# Patient Record
Sex: Female | Born: 1950
Health system: Southern US, Community
[De-identification: ages and names within clinical notes are randomized; demographics above are authoritative.]

## PROBLEM LIST (undated history)

## (undated) DIAGNOSIS — R112 Nausea with vomiting, unspecified: Secondary | ICD-10-CM

## (undated) DIAGNOSIS — Z923 Personal history of irradiation: Secondary | ICD-10-CM

## (undated) DIAGNOSIS — T7840XA Allergy, unspecified, initial encounter: Secondary | ICD-10-CM

## (undated) DIAGNOSIS — Z9889 Other specified postprocedural states: Secondary | ICD-10-CM

## (undated) DIAGNOSIS — I10 Essential (primary) hypertension: Secondary | ICD-10-CM

## (undated) DIAGNOSIS — K900 Celiac disease: Secondary | ICD-10-CM

## (undated) DIAGNOSIS — E039 Hypothyroidism, unspecified: Secondary | ICD-10-CM

## (undated) HISTORY — DX: Allergy, unspecified, initial encounter: T78.40XA

## (undated) HISTORY — DX: Hypothyroidism, unspecified: E03.9

## (undated) HISTORY — PX: OOPHORECTOMY: SHX86

## (undated) HISTORY — PX: OTHER SURGICAL HISTORY: SHX169

## (undated) HISTORY — DX: Celiac disease: K90.0

## (undated) HISTORY — PX: CHOLECYSTECTOMY: SHX55

## (undated) HISTORY — DX: Essential (primary) hypertension: I10

## (undated) HISTORY — PX: ABDOMINAL HYSTERECTOMY: SHX81

## (undated) HISTORY — PX: TONSILLECTOMY: SUR1361

## (undated) HISTORY — PX: COCCYX REMOVAL: SHX600

---
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
## 1998-05-01 ENCOUNTER — Other Ambulatory Visit: Admission: RE | Admit: 1998-05-01 | Discharge: 1998-05-01 | Payer: Self-pay | Admitting: Gynecology

## 1999-05-06 ENCOUNTER — Encounter: Admission: RE | Admit: 1999-05-06 | Discharge: 1999-05-06 | Payer: Self-pay | Admitting: Internal Medicine

## 1999-05-06 ENCOUNTER — Encounter: Payer: Self-pay | Admitting: Internal Medicine

## 1999-05-13 ENCOUNTER — Encounter: Payer: Self-pay | Admitting: Internal Medicine

## 1999-05-13 ENCOUNTER — Encounter: Admission: RE | Admit: 1999-05-13 | Discharge: 1999-05-13 | Payer: Self-pay | Admitting: Internal Medicine

## 1999-07-29 ENCOUNTER — Other Ambulatory Visit: Admission: RE | Admit: 1999-07-29 | Discharge: 1999-07-29 | Payer: Self-pay | Admitting: Gynecology

## 2000-07-29 ENCOUNTER — Other Ambulatory Visit: Admission: RE | Admit: 2000-07-29 | Discharge: 2000-07-29 | Payer: Self-pay | Admitting: Gynecology

## 2001-01-11 ENCOUNTER — Encounter: Payer: Self-pay | Admitting: Gynecology

## 2001-01-18 ENCOUNTER — Inpatient Hospital Stay (HOSPITAL_COMMUNITY): Admission: RE | Admit: 2001-01-18 | Discharge: 2001-01-20 | Payer: Self-pay | Admitting: Gynecology

## 2001-01-18 ENCOUNTER — Encounter (INDEPENDENT_AMBULATORY_CARE_PROVIDER_SITE_OTHER): Payer: Self-pay | Admitting: *Deleted

## 2002-01-10 ENCOUNTER — Encounter: Admission: RE | Admit: 2002-01-10 | Discharge: 2002-01-10 | Payer: Self-pay | Admitting: Urology

## 2002-01-10 ENCOUNTER — Encounter: Payer: Self-pay | Admitting: Urology

## 2002-02-02 ENCOUNTER — Encounter: Payer: Self-pay | Admitting: Urology

## 2002-02-02 ENCOUNTER — Encounter: Admission: RE | Admit: 2002-02-02 | Discharge: 2002-02-02 | Payer: Self-pay | Admitting: Urology

## 2002-03-30 ENCOUNTER — Other Ambulatory Visit: Admission: RE | Admit: 2002-03-30 | Discharge: 2002-03-30 | Payer: Self-pay | Admitting: Gynecology

## 2003-04-04 ENCOUNTER — Other Ambulatory Visit: Admission: RE | Admit: 2003-04-04 | Discharge: 2003-04-04 | Payer: Self-pay | Admitting: Gynecology

## 2003-08-07 ENCOUNTER — Encounter: Admission: RE | Admit: 2003-08-07 | Discharge: 2003-08-07 | Payer: Self-pay | Admitting: Internal Medicine

## 2003-09-20 ENCOUNTER — Encounter: Admission: RE | Admit: 2003-09-20 | Discharge: 2003-09-20 | Payer: Self-pay | Admitting: Gynecology

## 2004-04-14 ENCOUNTER — Other Ambulatory Visit: Admission: RE | Admit: 2004-04-14 | Discharge: 2004-04-14 | Payer: Self-pay | Admitting: Gynecology

## 2004-06-30 ENCOUNTER — Ambulatory Visit: Payer: Self-pay | Admitting: Internal Medicine

## 2005-04-27 ENCOUNTER — Other Ambulatory Visit: Admission: RE | Admit: 2005-04-27 | Discharge: 2005-04-27 | Payer: Self-pay | Admitting: Gynecology

## 2005-09-23 ENCOUNTER — Ambulatory Visit: Payer: Self-pay | Admitting: Internal Medicine

## 2006-04-29 ENCOUNTER — Other Ambulatory Visit: Admission: RE | Admit: 2006-04-29 | Discharge: 2006-04-29 | Payer: Self-pay | Admitting: Gynecology

## 2006-05-12 ENCOUNTER — Encounter: Admission: RE | Admit: 2006-05-12 | Discharge: 2006-05-12 | Payer: Self-pay | Admitting: Gynecology

## 2006-06-15 ENCOUNTER — Ambulatory Visit: Payer: Self-pay | Admitting: Internal Medicine

## 2006-08-27 ENCOUNTER — Ambulatory Visit: Payer: Self-pay | Admitting: Internal Medicine

## 2006-11-10 ENCOUNTER — Ambulatory Visit: Payer: Self-pay | Admitting: Internal Medicine

## 2007-01-06 ENCOUNTER — Encounter: Admission: RE | Admit: 2007-01-06 | Discharge: 2007-01-06 | Payer: Self-pay | Admitting: Gynecology

## 2007-04-13 ENCOUNTER — Telehealth: Payer: Self-pay | Admitting: Internal Medicine

## 2007-06-02 ENCOUNTER — Other Ambulatory Visit: Admission: RE | Admit: 2007-06-02 | Discharge: 2007-06-02 | Payer: Self-pay | Admitting: Gynecology

## 2007-06-03 ENCOUNTER — Ambulatory Visit: Payer: Self-pay | Admitting: Internal Medicine

## 2007-06-05 DIAGNOSIS — I10 Essential (primary) hypertension: Secondary | ICD-10-CM | POA: Insufficient documentation

## 2007-06-13 ENCOUNTER — Ambulatory Visit: Payer: Self-pay | Admitting: Internal Medicine

## 2007-07-20 ENCOUNTER — Ambulatory Visit: Payer: Self-pay | Admitting: Internal Medicine

## 2007-07-20 DIAGNOSIS — J309 Allergic rhinitis, unspecified: Secondary | ICD-10-CM | POA: Insufficient documentation

## 2007-09-05 ENCOUNTER — Telehealth: Payer: Self-pay | Admitting: Internal Medicine

## 2007-10-18 ENCOUNTER — Ambulatory Visit: Payer: Self-pay | Admitting: Internal Medicine

## 2007-11-25 ENCOUNTER — Telehealth: Payer: Self-pay | Admitting: Internal Medicine

## 2007-11-30 ENCOUNTER — Telehealth (INDEPENDENT_AMBULATORY_CARE_PROVIDER_SITE_OTHER): Payer: Self-pay | Admitting: *Deleted

## 2007-12-06 ENCOUNTER — Ambulatory Visit: Payer: Self-pay | Admitting: Internal Medicine

## 2007-12-12 ENCOUNTER — Telehealth: Payer: Self-pay | Admitting: Internal Medicine

## 2007-12-14 ENCOUNTER — Telehealth (INDEPENDENT_AMBULATORY_CARE_PROVIDER_SITE_OTHER): Payer: Self-pay | Admitting: *Deleted

## 2007-12-19 ENCOUNTER — Encounter: Admission: RE | Admit: 2007-12-19 | Discharge: 2007-12-19 | Payer: Self-pay | Admitting: Internal Medicine

## 2007-12-23 ENCOUNTER — Encounter: Payer: Self-pay | Admitting: Internal Medicine

## 2007-12-26 ENCOUNTER — Encounter: Payer: Self-pay | Admitting: Internal Medicine

## 2008-01-06 ENCOUNTER — Encounter: Payer: Self-pay | Admitting: Internal Medicine

## 2008-03-07 ENCOUNTER — Encounter: Payer: Self-pay | Admitting: Internal Medicine

## 2008-03-27 ENCOUNTER — Telehealth: Payer: Self-pay | Admitting: Internal Medicine

## 2008-04-20 ENCOUNTER — Ambulatory Visit: Payer: Self-pay | Admitting: Internal Medicine

## 2008-04-20 DIAGNOSIS — K900 Celiac disease: Secondary | ICD-10-CM | POA: Insufficient documentation

## 2008-06-20 ENCOUNTER — Encounter: Payer: Self-pay | Admitting: Internal Medicine

## 2008-11-07 ENCOUNTER — Ambulatory Visit: Payer: Self-pay | Admitting: Internal Medicine

## 2008-11-07 LAB — HM COLONOSCOPY

## 2008-11-07 LAB — HM MAMMOGRAPHY

## 2008-11-09 LAB — CONVERTED CEMR LAB
ALT: 38 units/L — ABNORMAL HIGH (ref 0–35)
BUN: 17 mg/dL (ref 6–23)
Basophils Absolute: 0 10*3/uL (ref 0.0–0.1)
Bilirubin, Direct: 0.3 mg/dL (ref 0.0–0.3)
Chloride: 106 meq/L (ref 96–112)
Cholesterol: 163 mg/dL (ref 0–200)
Creatinine, Ser: 0.9 mg/dL (ref 0.4–1.2)
Eosinophils Absolute: 0 10*3/uL (ref 0.0–0.7)
Eosinophils Relative: 0.6 % (ref 0.0–5.0)
GFR calc non Af Amer: 68.24 mL/min (ref >60)
LDL Cholesterol: 102 mg/dL — ABNORMAL HIGH (ref 0–99)
MCV: 92.7 fL (ref 78.0–100.0)
Monocytes Absolute: 0.2 10*3/uL (ref 0.1–1.0)
Neutrophils Relative %: 58.1 % (ref 43.0–77.0)
Platelets: 254 10*3/uL (ref 150.0–400.0)
Total Bilirubin: 1.1 mg/dL (ref 0.3–1.2)
Triglycerides: 89 mg/dL (ref 0.0–149.0)
VLDL: 17.8 mg/dL (ref 0.0–40.0)
WBC: 4.1 10*3/uL — ABNORMAL LOW (ref 4.5–10.5)

## 2009-03-15 ENCOUNTER — Encounter: Admission: RE | Admit: 2009-03-15 | Discharge: 2009-03-15 | Payer: Self-pay | Admitting: Gynecology

## 2009-06-24 ENCOUNTER — Ambulatory Visit: Payer: Self-pay | Admitting: Internal Medicine

## 2009-06-24 DIAGNOSIS — K120 Recurrent oral aphthae: Secondary | ICD-10-CM | POA: Insufficient documentation

## 2009-10-21 ENCOUNTER — Encounter: Payer: Self-pay | Admitting: Internal Medicine

## 2010-02-27 ENCOUNTER — Ambulatory Visit: Payer: Self-pay | Admitting: Internal Medicine

## 2010-03-24 ENCOUNTER — Telehealth: Payer: Self-pay | Admitting: Internal Medicine

## 2010-08-02 ENCOUNTER — Encounter: Payer: Self-pay | Admitting: Internal Medicine

## 2010-08-02 ENCOUNTER — Encounter: Payer: Self-pay | Admitting: Gynecology

## 2010-08-14 NOTE — Letter (Signed)
Summary: Gynecology-Dr. Rexford Maus  Gynecology-Dr. Rexford Maus   Imported By: Laural Benes 10/30/2009 11:29:59  _____________________________________________________________________  External Attachment:    Type:   Image     Comment:   External Document

## 2010-08-14 NOTE — Progress Notes (Signed)
Summary: fexofenadine refill  Phone Note Refill Request Message from:  Fax from Pharmacy on March 24, 2010 12:19 PM  Refills Requested: Medication #1:  FEXOFENADINE HCL 60 MG TABS Take 1 tablet by mouth two times a day as needed Initial call taken by: Westley Hummer CMA Deborra Medina),  March 24, 2010 12:19 PM    Prescriptions: FEXOFENADINE HCL 60 MG TABS (FEXOFENADINE HCL) Take 1 tablet by mouth two times a day as needed  #180 Tablet x 3   Entered by:   Westley Hummer CMA ([place])   Authorized by:   Phoebe Sharps MD   Signed by:   Westley Hummer CMA ([place]) on 03/24/2010   Method used:   Electronically to        [facility] [phone]* (retail)       [address]       [place], [place]  [address]       Ph: [phone] or [phone]       Fax: [phone]   RxID:   [id number]

## 2010-08-14 NOTE — Assessment & Plan Note (Signed)
Summary: ?conjunctivitis/mucus like discharge from eye/burning and itc...   Vital Signs:  Patient profile:   60 year old female Weight:      182 pounds Temp:     97.6 degrees F oral BP sitting:   130 / 80  (left arm) Cuff size:   regular  Vitals Entered By: Clearnce Sorrel CMA (February 27, 2010 9:22 AM) CC: Lft eye,conjunctivitis, mucus, discharge, eye, burning, ichy, x 2 days,src   CC:  Lft eye, conjunctivitis, mucus, discharge, eye, burning, ichy, x 2 days, and src.  History of Present Illness: 60 year old patient who is in today with a two-day history ofredness and minimal drainage involving the left eye.  Approximately 4 weeks ago,  she had surgery for a retinal tear.  There is been no change in her visual acuity nor pain.  She does  have a history of allergies but her right eye has not been affected.  She has treated hypertension.  She was prescribed.  Three days of antibiotic eyedrops, post surgery.  Current Medications (verified): 1)  Fexofenadine Hcl 60 Mg Tabs (Fexofenadine Hcl) .... Take 1 Tablet By Mouth Two Times A Day As Needed 2)  Lisinopril-Hydrochlorothiazide 20-25 Mg  Tabs (Lisinopril-Hydrochlorothiazide) .... Take 1 Tab By Mouth Daily 3)  Vivelle-Dot 0.0375 Mg/24hr  Pttw (Estradiol) .... Change 2x Week--Changes 1x Week 4)  Anestacon 2 % Gel (Lidocaine Hcl) .Marland Kitchen.. 1 Teaspoon Swiss and Swallow Every 6 Hours As Needed For Pain 5)  Hydrocodone-Acetaminophen 5-500 Mg Tabs (Hydrocodone-Acetaminophen) .... One Every 6 Hours As Needed For Pain 6)  Fish Oil 1000 Mg Caps (Omega-3 Fatty Acids) .... Once A Day  Allergies (verified): 1)  ! Keflex  Past History:  Past Medical History: Reviewed history from 07/20/2007 and no changes required. Hypertension Allergic rhinitis  Physical Exam  General:  overweight-appearing.  120/78 Eyes:  bilateral conjunctiva injection, left greater than the right.  No discharge; pupillary responses normal   Impression &  Recommendations:  Problem # 1:  CONJUNCTIVITIS (ICD-372.30)  Problem # 2:  HYPERTENSION (ICD-401.9)  Her updated medication list for this problem includes:    Lisinopril-hydrochlorothiazide 20-25 Mg Tabs (Lisinopril-hydrochlorothiazide) .Marland Kitchen... Take 1 tab by mouth daily  Complete Medication List: 1)  Fexofenadine Hcl 60 Mg Tabs (Fexofenadine hcl) .... Take 1 tablet by mouth two times a day as needed 2)  Lisinopril-hydrochlorothiazide 20-25 Mg Tabs (Lisinopril-hydrochlorothiazide) .... Take 1 tab by mouth daily 3)  Vivelle-dot 0.0375 Mg/24hr Pttw (Estradiol) .... Change 2x week--changes 1x week 4)  Anestacon 2 % Gel (Lidocaine hcl) .Marland Kitchen.. 1 teaspoon swiss and swallow every 6 hours as needed for pain 5)  Hydrocodone-acetaminophen 5-500 Mg Tabs (Hydrocodone-acetaminophen) .... One every 6 hours as needed for pain 6)  Fish Oil 1000 Mg Caps (Omega-3 fatty acids) .... Once a day  Patient Instructions: 1)  use antibiotic eyedrops twice daily for 7 days 2)  follow-up in ophthalmology if there is no prompt improvement Prescriptions: LISINOPRIL-HYDROCHLOROTHIAZIDE 20-25 MG  TABS (LISINOPRIL-HYDROCHLOROTHIAZIDE) Take 1 tab by mouth daily  #90 Tablet x 6   Entered and Authorized by:   Marletta Lor  MD   Signed by:   Marletta Lor  MD on 02/27/2010   Method used:   Print then Give to Patient   RxID:   [id number] FEXOFENADINE HCL 60 MG TABS (FEXOFENADINE HCL) Take 1 tablet by mouth two times a day as needed  #180 Tablet x 6   Entered and Authorized by:   Marletta Lor  MD  Signed by:   Marletta Lor  MD on 02/27/2010   Method used:   Print then Give to Patient   RxID:   [id number] LISINOPRIL-HYDROCHLOROTHIAZIDE 20-25 MG  TABS (LISINOPRIL-HYDROCHLOROTHIAZIDE) Take 1 tab by mouth daily  #90 Tablet x 6   Entered and Authorized by:   Marletta Lor  MD   Signed by:   Marletta Lor  MD on 02/27/2010   Method used:   Electronically to        [facility] (retail)       [address]       [place], [place]  [address]       Ph: [phone] or [phone]       Fax: [phone]   RxID:   [id number] FEXOFENADINE HCL 60 MG TABS (FEXOFENADINE HCL) Take 1 tablet by mouth two times a day as needed  #180 Tablet x 6   Entered and Authorized by:   Marletta Lor  MD   Signed by:   Marletta Lor  MD on 02/27/2010   Method used:   Electronically to        [facility] [phone]* (retail)       [address]       [place], [place]  [address]       Ph: [phone] or [phone]       Fax: [phone]   RxID:   [id number]

## 2010-08-14 NOTE — Miscellaneous (Signed)
Summary: med list update   Clinical Lists Changes  Medications: Removed medication of HYDROCODONE-ACETAMINOPHEN 5-500 MG TABS (HYDROCODONE-ACETAMINOPHEN) one every 6 hours as needed for pain Removed medication of ANESTACON 2 % GEL (LIDOCAINE HCL) 1 teaspoon Swiss and swallow every 6 hours as needed for pain

## 2010-09-03 ENCOUNTER — Ambulatory Visit (INDEPENDENT_AMBULATORY_CARE_PROVIDER_SITE_OTHER): Payer: BC Managed Care – PPO | Admitting: Internal Medicine

## 2010-09-03 ENCOUNTER — Telehealth: Payer: Self-pay | Admitting: *Deleted

## 2010-09-03 ENCOUNTER — Encounter: Payer: Self-pay | Admitting: Internal Medicine

## 2010-09-03 DIAGNOSIS — J069 Acute upper respiratory infection, unspecified: Secondary | ICD-10-CM

## 2010-09-03 MED ORDER — AZITHROMYCIN 250 MG PO TABS: ORAL_TABLET | ORAL | Status: AC

## 2010-09-03 NOTE — Assessment & Plan Note (Signed)
Suspect viral etiology currently day #5 of sx however is planning on a out of country trip soon. Given abx prescription to fill and hold for zpak. Begin if sx do not improve after total duration of 7-10 days. Continue otc sx relief prn. Followup if no improvement or worsening.

## 2010-09-03 NOTE — Telephone Encounter (Signed)
Pt is complaining of URI x 5 days.  Made an appt with Dr. Elizebeth Koller today to be evaluated before flying.

## 2010-09-03 NOTE — Progress Notes (Signed)
  Subjective:    Patient ID: Christine Leon, female    DOB: 1950/07/19, 60 y.o.   MRN: [id number]  HPI Pt presents to clinic for evaluation of URI sx's. Notes 5d h/o sx's initially beginning with ST progressing to nasal congestion and drainage. Now notes subjective fever yesterday and development of NP cough. +sick exposure. No exacerbating factors. Is taking otc alkaseltzer cold medication prn. Is preparing to fly to [place] soon. No other complaints.  Reviewed PMH, medications, and allergies.   Review of Systems  Constitutional: Positive for fever. Negative for chills.  HENT: Positive for congestion and rhinorrhea. Negative for facial swelling, neck pain and ear discharge.   Eyes: Negative for discharge and redness.  Respiratory: Positive for cough. Negative for shortness of breath and wheezing.        Objective:   Physical Exam  [nursing notereviewed. Constitutional: She appears well-developed. No distress.  HENT:  Head: Normocephalic and atraumatic.  Right Ear: Tympanic membrane, external ear and ear canal normal.  Left Ear: Tympanic membrane, external ear and ear canal normal.  Nose: Nose normal.  Mouth/Throat: Oropharynx is clear and moist. No oropharyngeal exudate.  Eyes: Conjunctivae are normal. Right eye exhibits no discharge. Left eye exhibits no discharge. No scleral icterus.  Neck: Neck supple.  Pulmonary/Chest: Effort normal and breath sounds normal. No respiratory distress. She has no wheezes. She has no rales.  Lymphadenopathy:    She has no cervical adenopathy.  Neurological: She is alert.  Skin: Skin is warm. Rash noted. She is not diaphoretic. No erythema.          Assessment & Plan:

## 2010-10-08 ENCOUNTER — Ambulatory Visit
Admission: RE | Admit: 2010-10-08 | Discharge: 2010-10-08 | Disposition: A | Discharge status: Home / Self Care | Payer: BC Managed Care – PPO | Source: Ambulatory Visit | Attending: Internal Medicine | Admitting: Internal Medicine

## 2010-10-08 ENCOUNTER — Ambulatory Visit (INDEPENDENT_AMBULATORY_CARE_PROVIDER_SITE_OTHER): Payer: BC Managed Care – PPO | Admitting: Internal Medicine

## 2010-10-08 ENCOUNTER — Telehealth: Payer: Self-pay | Admitting: *Deleted

## 2010-10-08 ENCOUNTER — Ambulatory Visit (INDEPENDENT_AMBULATORY_CARE_PROVIDER_SITE_OTHER)
Admission: RE | Admit: 2010-10-08 | Discharge: 2010-10-08 | Disposition: A | Discharge status: Home / Self Care | Payer: BC Managed Care – PPO | Source: Ambulatory Visit | Attending: Internal Medicine | Admitting: Internal Medicine

## 2010-10-08 ENCOUNTER — Encounter: Payer: Self-pay | Admitting: Internal Medicine

## 2010-10-08 DIAGNOSIS — M25572 Pain in left ankle and joints of left foot: Secondary | ICD-10-CM

## 2010-10-08 DIAGNOSIS — M25579 Pain in unspecified ankle and joints of unspecified foot: Secondary | ICD-10-CM

## 2010-10-08 DIAGNOSIS — S8990XA Unspecified injury of unspecified lower leg, initial encounter: Secondary | ICD-10-CM

## 2010-10-08 DIAGNOSIS — S99919A Unspecified injury of unspecified ankle, initial encounter: Secondary | ICD-10-CM

## 2010-10-08 NOTE — Progress Notes (Signed)
  Subjective:    Patient ID: Christine Leon, female    DOB: 10/28/1950, 60 y.o.   MRN: [id number]  HPI  Patient tripped over a threshold this morning at a plumbing supply house. She had acute onset pain over the left metatarsal area. Had significant swelling. She has pain with walking. Pain can be intense at times. She went for an x-ray over at [facility]. Results are pending. I have seen the x-ray does not document any obvious fracture.  Allergies reviewed. Medications reviewed.     Review of Systems No other significant complaints.    Objective:   Physical Exam Patient is here with her daughter. She is in a wheelchair. She has full range of motion of the left ankle. She has a significant hematoma over the left lateral metatarsal area.       Assessment & Plan:  Acute foot trauma. I'm not sure the x-ray For the area of her foot of interest. I'm going to send her back for an x-ray of her foot. I think this will turn out to be a sprain. I suspect we'll need a walking boot for a period of time.

## 2010-10-08 NOTE — Progress Notes (Signed)
Addended byTownsend Roger on: 10/08/2010 11:34 AM   Modules accepted: Orders

## 2010-10-08 NOTE — Telephone Encounter (Signed)
Pt.fell this am and thinks she sprained ankle or fractured it.  Per Dr. Leanne Chang, xray at [facility] and come straight to the office.

## 2010-10-22 ENCOUNTER — Ambulatory Visit: Payer: BC Managed Care – PPO | Admitting: Internal Medicine

## 2010-11-28 NOTE — Op Note (Signed)
[facility]  Patient:    Christine Leon, Christine Leon             MRN: [id number] Proc. Date: 01/18/01 Adm. Date:  [id number] Attending:  Oswaldo Done CC:         Darrick Penna. Swords, M.D. [facility]   Operative Report  PREOPERATIVE DIAGNOSES: 1. Abnormal uterine bleeding with severe anemia. 2. Pelvic organ prolapse.  PROCEDURE:  Vaginal hysterectomy, cardinal-uterosacral colposuspension, posterior and enterocele repairs, bilateral salpingo-oophorectomy.  SURGEON:  Selinda Orion, M.D.  ASSISTANT:  Dallie Dad, M.D.  ANESTHESIA:  General orotracheal.  DESCRIPTION OF PROCEDURE:  Under excellent general anesthesia with the patient prepped and draped in the lithotomy position and a Foley catheter draining her bladder, the cervix was grasped with a single-tooth tenaculum and pulled down and viewed.  The mucosa was then injected with Xylocaine with epinephrine 1:100,000.  The mucosa was then circumcised and pushed off the lower uterine segment.  The cul-de-sac was then entered.  The uterosacral and cardinal ligaments were then progressively clamped, cut, sutured, and tied with 0 Vicryl.  They were then identified for subsequent support of the vaginal cuff.  The anterior peritoneum was entered and a Deaver placed beneath the bladder.  The uterine vessels were then clamped, cut, sutured, and tied with 0 Vicryl.  The uterus was then inverted and the ovarian ligaments clamped across.  The uterus was then excised.  At this point, a clamp was placed across the infundibulopelvic vessels, so as to remove both ovaries as well. The pedicles were then sutured and doubly ligated with 0 Vicryl.  At this point, a pursestring suture was used to close from anterior peritoneum to lateral pedicles to high in the cul-de-sac, so as to obliterate the enterocele, as well as close the peritoneum.  The cul-de-sac peritoneum was then removed.  Next, uterosacral-cardinal complex  sutures were placed securing the uterosacral ligament high to the cardinal and uterosacral ligament at the cuff and a full-thickness of both anterior and posterior vaginal fascia.  At this point, the pedicles were secured and tied, so as to elevate the vaginal cuff to a high intra-abdominal position.  The vagina was then closed with interrupted sutures of 0 Vicryl including full-thickness of the anterior and posterior vaginal wall fascia.  At this point, attention was turned to the posterior repair.  The mucosa was grasped at the entroitus and incised.  The incision was then continued to the apex of the vaginal cuff.  The mucosa was then dissected from the perirectal fascia and reflected, so as to identify the defects.  The transverse and central fascial defects were identified and then were repaired by securing the perirectal fascia to the apex of the vaginal cuff fascial closure previously undertaken.  The fascia was then plicated from the apex of the vaginal cuff to the entroitus.  The mucosa was then trimmed and the upper layer of endopelvic fascia and the mucosa closed as a subcuticular closure of 2-0 Vicryl.  At this point, the vagina remained snug by digital.  The mucosa was well approximated.  The perineal body was rebuilt with interrupted sutures of 2-0 Vicryl.  At this point, the procedure was terminated without complication.  The patient returned to the recovery room in excellent condition. DD:  01/19/01 TD:  01/19/01 Job: [id number]

## 2010-11-28 NOTE — H&P (Signed)
[facility]  Patient:    Christine Leon, MALES             MRN: [id number] Adm. Date:  [id number] Attending:  Oswaldo Done CC:         Darrick Penna. Swords, M.D. [facility], [place], [place]   History and Physical  DATE OF SURGERY:  January 18, 2001  CHIEF COMPLAINT: 1. Abnormal uterine bleeding and severe anemia. 2. Pelvic support problems.  HISTORY OF PRESENT ILLNESS:  [age]-year-old white married gravida 2, para 2 with known uterine leiomyomata with intrusion into the cavity by anterior wall myoma.  She has had increasingly severe dysmenorrhea and hypermenorrhea, now to the point of severe anemia and bleeding coming through her clothing.  She has been placed on Nordette for management of her abnormal uterine bleeding without significant benefit.  She also has severe pelvic support problems of the posterior vaginal wall with large rectocele and enterocele.  She is now admitted for definitive therapy by vaginal hysterectomy and vaginal vault suspension by cardinal-uterosacral colposuspension, posterior and enterocele repair.  She understands the alternatives, risk/benefit ratio and is now admitted for definitive therapy.  PAST MEDICAL HISTORY:  Usual childhood diseases without sequelae.  Medical illnesses:  History of cholecystectomy and peptic ulcer disease; no other significant medical illnesses.  Hospitalizations:  T&A, 1959; coccyx removed in 1975; cholecystectomy in 1999.  ALLERGIES:  None known.  PRESENT MEDICATIONS:  Nordette for control of abnormal uterine bleeding.  HABITS:  Smokes approximately two packs per week.  Denies significant ethanol use.  FAMILY HISTORY:  Mother and father are in good health in their 36s.  Maternal grandmother had carcinoma of the liver.  Maternal aunt had breast cancer.  No other known familial tendencies.  REVIEW OF SYSTEMS:  HEENT:  Denies symptoms.  CARDIORESPIRATORY:  Denies asthma, cough, bronchitis,  shortness of breath.  GI/GU:  Denies frequency, urgency, dysuria, change in bowel habits, food intolerance.  PHYSICAL EXAMINATION:  GENERAL:  Generally well-developed, well-nourished white female.  HEENT:  Pupils equal, react to light and accommodate.  Fundi not examined. Oropharynx clear.  NECK:  Supple without mass or thyroid enlargement.  BREASTS:  Soft without mass, nodes or nipple discharge.  HEART:  Regular rate and rhythm without murmur or cardiac enlargement.  ABDOMEN:  Soft, scaphoid, without mass or organomegaly.  PELVIC:  External genitalia:  Normal female.  Vagina clean, rugose.  Extremely poor posterior pelvic support with significant weakness of perirectal fascia with both central and transverse fascial defects.  She has grade 3 rectocele and enterocele, with detachment of the perineal body.  Her cervix is parous, clean.  Uterus is anterior with anterior wall fibroid.  Adnexa are clear. Rectovaginal exam confirms.  EXTREMITIES:  Negative.  NEUROLOGIC:  Physiologic.  IMPRESSIONS: 1. Leiomyomata with severe abnormal uterine bleeding and anemia. 2. Pelvic support problems with grade 3 rectocele and enterocele. 3. Perimenopausal acne.  RECOMMENDATIONS:  Vaginal hysterectomy, possible salpingo-oophorectomy, posterior and enterocele repairs and I have discussed risk/benefit ratio in detail, particularly the greater risk of thromboembolic disease on oral contraceptives for control of her bleeding, possible need for hormone replacement, etc.  Alternatives discussed in detail.DD:  01/18/01 TD:  01/18/01 Job: [id number]

## 2010-11-28 NOTE — Op Note (Signed)
[facility]  Patient:    Christine Leon, Christine Leon                         MRN: [id number] Proc. Date: 01/18/01 Adm. Date:  01/20/01 Attending:  Selinda Orion, M.D. Dictator:   Lesia Hausen, RN, FNP CC:         Darrick Penna. Swords, M.D. [facility]   Operative Report  HISTORY OF PRESENT ILLNESS:   Ms. Fahr is a 60 year old white married female, gravida 2, para 2, who has known uterine leiomyomata with intrusion into the cavity by anterior wall myoma. She reports severe dysmenorrhea and hypermenorrhea and now is to the point of severe anemia and bleeding through her clothing. She was placed on Nordette, which is an oral contraceptive, for management of the abnormal bleeding without significant benefit. Upon examination it was also noted that she had severe pelvic support problems with posterior vaginal wall with large rectocele and enterocele. She is now admitted for definitive therapy by vaginal hysterectomy and vaginal vault suspension by cardinal uterosacral colposuspension, posterior and enterocele repairs.  ADMISSION EXAMINATION:  Chest clear to A&P. Heart: Rate and rhythm were regular without murmur, gallop, or cardiac enlargement. Abdomen soft and scaphoid without masses, organomegaly. Pelvic exam: External genitalia within normal limits for female. Vagina clean and rugous. Extremely poor posterior pelvic support with significant weakness of the rectal fascia with both central and transverse defects. She has a grade 3 rectocele and enterocele with detachment of the perineal body. Cervix is parous and clean. Uterus anterior with anterior wall fibroid. Adnexa bilaterally clear. Rectovaginal exam confirms.  IMPRESSION: 1. Leiomyomata with severe abnormal uterine bleeding and anemia. 2. Pelvic support problems with grade 3 rectocele and enterocele. 3. Perimenopausal acne. 4. Questionable allergy to Keflex.  PLAN:  Vaginal hysterectomy, possible salpingo-oophorectomy,  posterior and enterocele repairs. Risk and benefits have been discussed with patient and she accepts these procedures.  LABORATORY DATA:  Admission hemoglobin 13.4, hematocrit 39.5. On the first postoperative day hemoglobin was 11.3, hematocrit 32.8.  EKG: Normal sinus rhythm.  HOSPITAL COURSE:   The patient underwent vaginal hysterectomy, cardinal uterosacral colposuspension, posterior and enterocele repairs and bilateral salpingo-oophorectomy under general anesthesia. The procedures were completed without any complications and the patient was returned to the recovery room in excellent condition.  PATHOLOGY REPORT:  Myometrium, adenomyosis, and leiomyomata measuring up to 2.5 cm. No evidence of hyperplasia of the endometrium and squamous metaplasia of the uterine cervix.  POSTOPERATIVE COURSE:  Her postoperative course was without complications and she was discharged on the second postoperative day in excellent condition.  FINAL DISCHARGE INSTRUCTIONS:  No heavy lifting or straining, no vaginal entrance, increase ambulation as tolerated. She is to call with any fever of over 100.1 of failure of daily improvement. Diet: Regular.  DISCHARGE MEDICATIONS: 1. Tylox one p.o. q.2h. p.r.n. discomfort. 2. Vioxx 25 mg daily.  DISCHARGE FOLLOWUP:  She is to return to the office in one week for followup.  CONDITION ON DISCHARGE:  Excellent.  FINAL DISCHARGE DIAGNOSES: 1. Abnormal uterine bleeding with severe anemia. 2. Pelvic organ prolapse.  PROCEDURES PERFORMED:  Vaginal hysterectomy, cardinal uterosacral colposuspension, posterior and enterocele repairs and bilateral salpingo-oophorectomy under general anesthesia. DD:  02/07/01 TD:  02/07/01 Job: [id number]

## 2011-02-19 ENCOUNTER — Ambulatory Visit (INDEPENDENT_AMBULATORY_CARE_PROVIDER_SITE_OTHER): Payer: BC Managed Care – PPO | Admitting: Family Medicine

## 2011-02-19 ENCOUNTER — Encounter: Payer: Self-pay | Admitting: Family Medicine

## 2011-02-19 VITALS — BP 130/78 | Temp 98.3°F | Wt 174.0 lb

## 2011-02-19 DIAGNOSIS — R300 Dysuria: Secondary | ICD-10-CM

## 2011-02-19 DIAGNOSIS — R309 Painful micturition, unspecified: Secondary | ICD-10-CM

## 2011-02-19 LAB — POCT URINALYSIS DIPSTICK
Glucose, UA: NEGATIVE
Protein, UA: NEGATIVE

## 2011-02-19 MED ORDER — CIPROFLOXACIN HCL 500 MG PO TABS: 500.0000 mg | ORAL_TABLET | Freq: Two times a day (BID) | ORAL | Status: AC

## 2011-02-19 NOTE — Progress Notes (Signed)
  Subjective:    Patient ID: Christine Leon, female    DOB: 1951/03/11, 60 y.o.   MRN: [id number]  HPI Possible UTI. 2 weeks ago had burning and frequency and leftover Cipro started and taken for 5 days. Symptoms cleared and then recurrence today of similar symptoms. Mostly urine frequency but mild burning. No gross hematuria. No flank pain. Patient denies any fever, chills, nausea, vomiting, or any abdominal pain. No history of kidney stones. No appetite or weight changes.   Review of Systems As per history of present illness    Objective:   Physical Exam  Constitutional: She is oriented to person, place, and time. She appears well-developed and well-nourished.  HENT:  Mouth/Throat: Oropharynx is clear and moist.  Cardiovascular: Normal rate and regular rhythm.   Pulmonary/Chest: Effort normal and breath sounds normal. No respiratory distress. She has no wheezes. She has no rales.  Neurological: She is alert and oriented to person, place, and time.          Assessment & Plan:  #1 dysuria. Rule out UTI. Urine dipstick shows only 1+ blood no pyuria and no nitrites. Urine culture. Cover with Cipro 500 mg twice daily for 7 days pending culture results #2 hematuria on urine dipstick. Urine culture as above. Repeat UA in 2 weeks to make sure this is clearing and if not at that point urology referral

## 2011-02-21 LAB — URINE CULTURE

## 2011-02-23 ENCOUNTER — Telehealth: Payer: Self-pay | Admitting: *Deleted

## 2011-02-23 DIAGNOSIS — R300 Dysuria: Secondary | ICD-10-CM

## 2011-02-23 NOTE — Telephone Encounter (Signed)
Pt was given lab results and Dr Erick Blinks recommendation.  Pt requests a referral now rather than waiting 2 weeks for repeat UA as she is in so much abd/pelvic pain and continues with urinary frequency.

## 2011-02-23 NOTE — Telephone Encounter (Signed)
OK to proceed with urology referral.

## 2011-02-23 NOTE — Telephone Encounter (Signed)
Pt is asking for Urine Culture results, please.

## 2011-02-23 NOTE — Progress Notes (Signed)
Quick Note:  Pt informed, she has ongoing abd/pelvic pressure and does not want to wait 2 weeks for repeat UA, requesting referral to Urology today ______

## 2011-02-24 ENCOUNTER — Encounter: Payer: Self-pay | Admitting: Internal Medicine

## 2011-02-24 ENCOUNTER — Other Ambulatory Visit: Payer: Self-pay | Admitting: Gynecology

## 2011-02-24 DIAGNOSIS — Z1231 Encounter for screening mammogram for malignant neoplasm of breast: Secondary | ICD-10-CM

## 2011-02-24 NOTE — Telephone Encounter (Signed)
Pt informed of referral to Urology on home and cell VM

## 2011-02-25 ENCOUNTER — Ambulatory Visit
Admission: RE | Admit: 2011-02-25 | Discharge: 2011-02-25 | Disposition: A | Discharge status: Home / Self Care | Payer: BC Managed Care – PPO | Source: Ambulatory Visit | Attending: Gynecology | Admitting: Gynecology

## 2011-02-25 DIAGNOSIS — Z1231 Encounter for screening mammogram for malignant neoplasm of breast: Secondary | ICD-10-CM

## 2011-03-05 ENCOUNTER — Other Ambulatory Visit: Payer: BC Managed Care – PPO

## 2011-05-08 ENCOUNTER — Other Ambulatory Visit: Payer: Self-pay | Admitting: Internal Medicine

## 2011-05-08 NOTE — Telephone Encounter (Signed)
Dr. Leanne Chang pt

## 2011-09-10 ENCOUNTER — Telehealth: Payer: Self-pay | Admitting: *Deleted

## 2011-09-10 NOTE — Telephone Encounter (Signed)
Pt calls re: 5 days of vomiting and diarrhea, and now has developed abdominal distention and intense pain.  No fever, but 30 bouts of  Diarrhea daily.  Discussed with Dr. Raliegh Ip, and we advised her to to to URGENT care for xrays, labs, etc....Marland KitchenMarland Kitchen

## 2011-09-11 ENCOUNTER — Encounter: Payer: Self-pay | Admitting: Family

## 2011-09-11 ENCOUNTER — Telehealth: Payer: Self-pay

## 2011-09-11 ENCOUNTER — Ambulatory Visit (INDEPENDENT_AMBULATORY_CARE_PROVIDER_SITE_OTHER): Payer: 59 | Admitting: Family

## 2011-09-11 DIAGNOSIS — R112 Nausea with vomiting, unspecified: Secondary | ICD-10-CM

## 2011-09-11 DIAGNOSIS — E876 Hypokalemia: Secondary | ICD-10-CM

## 2011-09-11 DIAGNOSIS — R197 Diarrhea, unspecified: Secondary | ICD-10-CM

## 2011-09-11 DIAGNOSIS — A084 Viral intestinal infection, unspecified: Secondary | ICD-10-CM

## 2011-09-11 DIAGNOSIS — A09 Infectious gastroenteritis and colitis, unspecified: Secondary | ICD-10-CM

## 2011-09-11 LAB — BASIC METABOLIC PANEL
Chloride: 104 mEq/L (ref 96–112)
Creatinine, Ser: 0.9 mg/dL (ref 0.4–1.2)
Potassium: 2.8 mEq/L — CL (ref 3.5–5.1)
Sodium: 138 mEq/L (ref 135–145)

## 2011-09-11 LAB — CBC WITH DIFFERENTIAL/PLATELET
Basophils Relative: 0.2 % (ref 0.0–3.0)
Eosinophils Relative: 0.9 % (ref 0.0–5.0)
HCT: 46 % (ref 36.0–46.0)
Hemoglobin: 15.6 g/dL — ABNORMAL HIGH (ref 12.0–15.0)
MCV: 90.6 fl (ref 78.0–100.0)
Monocytes Absolute: 0.4 10*3/uL (ref 0.1–1.0)
Neutro Abs: 3.3 10*3/uL (ref 1.4–7.7)
Neutrophils Relative %: 51 % (ref 43.0–77.0)
RBC: 5.08 Mil/uL (ref 3.87–5.11)
WBC: 6.5 10*3/uL (ref 4.5–10.5)

## 2011-09-11 MED ORDER — PROMETHAZINE HCL 25 MG PO TABS: 25.0000 mg | ORAL_TABLET | Freq: Three times a day (TID) | ORAL | Status: DC | PRN

## 2011-09-11 MED ORDER — POTASSIUM CHLORIDE CRYS ER 20 MEQ PO TBCR
20.0000 meq | EXTENDED_RELEASE_TABLET | Freq: Two times a day (BID) | ORAL | Status: DC
Start: 2011-09-11 — End: 2013-02-27

## 2011-09-11 MED ORDER — PROMETHAZINE HCL 25 MG/ML IJ SOLN
25.0000 mg | Freq: Once | INTRAMUSCULAR | Status: AC
  Administered 2011-09-11: 25 mg via INTRAMUSCULAR

## 2011-09-11 MED ORDER — DIPHENOXYLATE-ATROPINE 2.5-0.025 MG PO TABS: 1.0000 | ORAL_TABLET | Freq: Four times a day (QID) | ORAL | Status: AC | PRN

## 2011-09-11 NOTE — Telephone Encounter (Signed)
Hope from [facility] called to report a critical lab:  Potassium is 2.8.

## 2011-09-11 NOTE — Progress Notes (Signed)
Subjective:    Patient ID: Christine Leon, female    DOB: 25-Jan-1951, 61 y.o.   MRN: [id number]  HPI 61 year old white female, nonsmoker, patient of Dr. Judd Gaudier is in today with complaints of nausea, vomiting, diarrhea visible nor for 5 days. Patient reports attending a baby shower with several people at work he'll with stomach viruses. She continues to have very loose stools, feelings of bloatedness. Denies any blood in her stools, no foul smelling stools no dark black stools. She's taken Imodium once but vomited and was unable to keep it down.    Review of Systems  Constitutional: Positive for fatigue.  Respiratory: Negative.   Cardiovascular: Negative.   Gastrointestinal: Positive for nausea, vomiting and diarrhea.  Musculoskeletal: Negative.   Neurological: Negative.   Hematological: Negative.   Psychiatric/Behavioral: Negative.    Past Medical History  Diagnosis Date  . Hypertension   . Allergy     History   Social History  . Marital Status: Married    Spouse Name: N/A    Number of Children: N/A  . Years of Education: N/A   Occupational History  . Not on file.   Social History Main Topics  . Smoking status: Former Research scientist (life sciences)  . Smokeless tobacco: Not on file  . Alcohol Use: Yes  . Drug Use: No  . Sexually Active:    Other Topics Concern  . Not on file   Social History Narrative  . No narrative on file    Past Surgical History  Procedure Date  . Cholecystectomy   . Abdominal hysterectomy   . Tonsillectomy   . Oophorectomy     Family History  Problem Relation Age of Onset  . Hypertension Mother   . Coronary artery disease Father     Allergies  Allergen Reactions  . Cephalexin     REACTION: rash    Current Outpatient Prescriptions on File Prior to Visit  Medication Sig Dispense Refill  . estradiol (VIVELLE-DOT) 0.0375 MG/24HR Place 1 patch onto the skin twice a week.        . fexofenadine (ALLEGRA) 60 MG tablet Take 60 mg by mouth 2 (two) times  daily as needed.        . fish oil-omega-3 fatty acids 1000 MG capsule Take 1 g by mouth daily.        Marland Kitchen glucosamine-chondroitin 500-400 MG tablet Take 1 tablet by mouth daily.        Marland Kitchen lisinopril-hydrochlorothiazide (PRINZIDE,ZESTORETIC) 20-25 MG per tablet TAKE 1 TABLET BY MOUTH EVERY DAY  90 tablet  3  . aspirin 81 MG tablet Take 81 mg by mouth daily.         No current facility-administered medications on file prior to visit.    BP 126/80  Ht 5' 5"  (1.651 m)  Wt 176 lb (79.833 kg)  BMI 29.29 kg/m2chart    Objective:   Physical Exam  Constitutional: She is oriented to person, place, and time. She appears well-developed and well-nourished.  HENT:  Right Ear: External ear normal.  Left Ear: External ear normal.  Nose: Nose normal.  Mouth/Throat: Oropharynx is clear and moist.  Neck: Normal range of motion. Neck supple.  Cardiovascular: Normal rate, regular rhythm and normal heart sounds.   Pulmonary/Chest: Effort normal and breath sounds normal.  Abdominal: Soft. She exhibits no mass. There is tenderness. There is no rebound and no guarding.  Musculoskeletal: Normal range of motion.  Neurological: She is alert and oriented to person, place, and time.  Skin:  Skin is warm and dry.  Psychiatric: She has a normal mood and affect.          Assessment and Plan   Assessment: Nausea, vomiting, diarrhea, viral gastroenteritis  Plan: Phenergan 25 mg one tablet every 8 hours when necessary nausea. Warned of  drowsiness. Lomotil when necessary. Lab sent to include BMP and CBC. Rest. Drink plenty of fluids. Call the office if symptoms worsen or persist coverage schedule and when necessary.

## 2011-09-11 NOTE — Patient Instructions (Signed)
Viral Gastroenteritis Viral gastroenteritis is also known as stomach flu. This condition affects the stomach and intestinal tract. The illness typically lasts 3 to 8 days. Most people develop an immune response. This eventually gets rid of the virus. While this natural response develops, the virus can make you quite ill.  CAUSES  Diarrhea and vomiting are often caused by a virus. Medicines (antibiotics) that kill germs will not help unless there is also a germ (bacterial) infection. SYMPTOMS  The most common symptom is diarrhea. This can cause severe loss of fluids (dehydration) and body salt (electrolyte) imbalance. TREATMENT  Treatments for this illness are aimed at rehydration. Antidiarrheal medicines are not recommended. They do not decrease diarrhea volume and may be harmful. Usually, home treatment is all that is needed. The most serious cases involve vomiting so severely that you are not able to keep down fluids taken by mouth (orally). In these cases, intravenous (IV) fluids are needed. Vomiting with viral gastroenteritis is common, but it will usually go away with treatment. HOME CARE INSTRUCTIONS  Small amounts of fluids should be taken frequently. Large amounts at one time may not be tolerated. Plain water may be harmful in infants and the elderly. Oral rehydration solutions (ORS) are available at pharmacies and grocery stores. ORS replace water and important electrolytes in proper proportions. Sports drinks are not as effective as ORS and may be harmful due to sugars worsening diarrhea.  As a general guideline for children, replace any new fluid losses from diarrhea or vomiting with ORS as follows:   If your child weighs 22 pounds or under (10 kg or less), give 60-120 mL (1/4 - 1/2 cup or 2 - 4 ounces) of ORS for each diarrheal stool or vomiting episode.   If your child weighs more than 22 pounds (more than 10 kgs), give 120-240 mL (1/2 - 1 cup or 4 - 8 ounces) of ORS for each diarrheal  stool or vomiting episode.   In a child with vomiting, it may be helpful to give the above ORS replacement in 5 mL (1 teaspoon) amounts every 5 minutes, then increase as tolerated.   While correcting for dehydration, children should eat normally. However, foods high in sugar should be avoided because this may worsen diarrhea. Large amounts of carbonated soft drinks, juice, gelatin desserts, and other highly sugared drinks should be avoided.   After correction of dehydration, other liquids that are appealing to the child may be added. Children should drink small amounts of fluids frequently and fluids should be increased as tolerated.   Adults should eat normally while drinking more fluids than usual. Drink small amounts of fluids frequently and increase as tolerated. Drink enough water and fluids to keep your urine clear or pale yellow. Broths, weak decaffeinated tea, lemon-lime soft drinks (allowed to go flat), and ORS replace fluids and electrolytes.   Avoid:   Carbonated drinks.   Juice.   Extremely hot or cold fluids.   Caffeine drinks.   Fatty, greasy foods.   Alcohol.   Tobacco.   Too much intake of anything at one time.   Gelatin desserts.   Probiotics are active cultures of beneficial bacteria. They may lessen the amount and number of diarrheal stools in adults. Probiotics can be found in yogurt with active cultures and in supplements.   Wash your hands well to avoid spreading bacteria and viruses.   Antidiarrheal medicines are not recommended for infants and children.   Only take over-the-counter or prescription medicines for  pain, discomfort, or fever as directed by your caregiver. Do not give aspirin to children.   For adults with dehydration, ask your caregiver if you should continue all prescribed and over-the-counter medicines.   If your caregiver has given you a follow-up appointment, it is very important to keep that appointment. Not keeping the appointment  could result in a lasting (chronic) or permanent injury and disability. If there is any problem keeping the appointment, you must call to reschedule.  SEEK IMMEDIATE MEDICAL CARE IF:   You are unable to keep fluids down.   There is no urine output in 6 to 8 hours or there is only a small amount of very dark urine.   You develop shortness of breath.   There is blood in the vomit (may look like coffee grounds) or stool.   Belly (abdominal) pain develops, increases, or localizes.   There is persistent vomiting or diarrhea.   You have a fever.   Your baby is older than 3 months with a rectal temperature of 102 F (38.9 C) or higher.   Your baby is 77 months old or younger with a rectal temperature of 100.4 F (38 C) or higher.  MAKE SURE YOU:   Understand these instructions.   Will watch your condition.   Will get help right away if you are not doing well or get worse.  Document Released: 06/29/2005 Document Revised: 03/11/2011 Document Reviewed: 11/10/2006 [facility] Patient Information 2012 [place].

## 2011-09-11 NOTE — Telephone Encounter (Signed)
Pt saw Padonda today and Abby Potash is out of the office for the rest of the afternoon.  Per Dr Leanne Chang call in [place] 20 meq bid for 3 days, including today and check BMET on Monday.  Pt aware she is going to go to [place] for lab draw on Monday.  Order placed, rx sent in electronically to [facility]

## 2012-01-29 ENCOUNTER — Encounter: Payer: Self-pay | Admitting: Internal Medicine

## 2012-05-02 ENCOUNTER — Ambulatory Visit (INDEPENDENT_AMBULATORY_CARE_PROVIDER_SITE_OTHER): Payer: 59 | Admitting: Family Medicine

## 2012-05-02 ENCOUNTER — Encounter: Payer: Self-pay | Admitting: Family Medicine

## 2012-05-02 VITALS — BP 102/70 | HR 68 | Temp 97.6°F | Wt 186.0 lb

## 2012-05-02 DIAGNOSIS — L255 Unspecified contact dermatitis due to plants, except food: Secondary | ICD-10-CM

## 2012-05-02 DIAGNOSIS — L237 Allergic contact dermatitis due to plants, except food: Secondary | ICD-10-CM

## 2012-05-02 MED ORDER — PREDNISONE 20 MG PO TABS: ORAL_TABLET | ORAL | Status: DC

## 2012-05-02 MED ORDER — HYDROXYZINE HCL 50 MG PO TABS: 50.0000 mg | ORAL_TABLET | Freq: Three times a day (TID) | ORAL | Status: DC | PRN

## 2012-05-02 NOTE — Patient Instructions (Addendum)
-  take steroids as directed  -use calamine lotion and itch medication if needed - do not scratch lesions  -follow up if any concerns or not resolved with treatment  -As we discussed, we have prescribed a new medication for you at this appointment. We discussed the common and serious potential adverse effects of this medication and you can review these and more with the pharmacist when you pick up your medication.  Please follow the instructions for use carefully and notify us immediately if you have any problems taking this medication.

## 2012-05-02 NOTE — Progress Notes (Signed)
Chief Complaint  Patient presents with  . posion ivy    HPI: -got into some poison ivy in flower bed - removed vine about 2 weeks ago -itchy rash on face, neck, hands -no SOB, fevers, vision changes, swelling of mouth or throat -taking allegra, calamine, topical steroid cream, but not healing it -has taken steroids in the past and did fine with these ROS: See pertinent positives and negatives per HPI.  Past Medical History  Diagnosis Date  . Hypertension   . Allergy     Family History  Problem Relation Age of Onset  . Hypertension Mother   . Coronary artery disease Father     History   Social History  . Marital Status: Married    Spouse Name: N/A    Number of Children: N/A  . Years of Education: N/A   Social History Main Topics  . Smoking status: Former Research scientist (life sciences)  . Smokeless tobacco: None  . Alcohol Use: Yes  . Drug Use: No  . Sexually Active:    Other Topics Concern  . None   Social History Narrative  . None    Current outpatient prescriptions:aspirin 81 MG tablet, Take 81 mg by mouth daily.  , Disp: , Rfl: ;  estradiol (VIVELLE-DOT) 0.0375 MG/24HR, Place 1 patch onto the skin twice a week.  , Disp: , Rfl: ;  fexofenadine (ALLEGRA) 60 MG tablet, Take 60 mg by mouth 2 (two) times daily as needed.  , Disp: , Rfl: ;  fish oil-omega-3 fatty acids 1000 MG capsule, Take 1 g by mouth daily.  , Disp: , Rfl:  glucosamine-chondroitin 500-400 MG tablet, Take 1 tablet by mouth daily.  , Disp: , Rfl: ;  lisinopril-hydrochlorothiazide (PRINZIDE,ZESTORETIC) 20-25 MG per tablet, TAKE 1 TABLET BY MOUTH EVERY DAY, Disp: 90 tablet, Rfl: 3;  potassium chloride SA (K-DUR,KLOR-CON) 20 MEQ tablet, Take 1 tablet (20 mEq total) by mouth 2 (two) times daily., Disp: 8 tablet, Rfl: 0 hydrOXYzine (ATARAX/VISTARIL) 50 MG tablet, Take 1 tablet (50 mg total) by mouth 3 (three) times daily as needed for itching., Disp: 30 tablet, Rfl: 0;  predniSONE (DELTASONE) 20 MG tablet, Take 92m (3 tablets)  daily for 5 days, then 426m(2 tablets) daily for 1 week, then 2039m1 tablet daily) for 1 week, then stop, Disp: 36 tablet, Rfl: 0;  promethazine (PHENERGAN) 25 MG tablet, Take 25 mg by mouth every 8 (eight) hours as needed., Disp: , Rfl:  DISCONTD: promethazine (PHENERGAN) 25 MG tablet, Take 1 tablet (25 mg total) by mouth every 8 (eight) hours as needed for nausea., Disp: 20 tablet, Rfl: 0  EXAM:  Filed Vitals:   05/02/12 1022  BP: 102/70  Pulse: 68  Temp: 97.6 F (36.4 C)    There is no height on file to calculate BMI.  GENERAL: vitals reviewed and listed above, alert, oriented, appears well hydrated and in no acute distress  HEENT: atraumatic, conjunttiva clear, no obvious abnormalities on inspection of external nose and ears  SKIN: small papules and vesicles L face, neck, hands  MS: moves all extremities without noticeable abnormality  PSYCH: pleasant and cooperative, no obvious depression or anxiety  ASSESSMENT AND PLAN:  Discussed the following assessment and plan:  1. Poison ivy  promethazine (PHENERGAN) 25 MG tablet, predniSONE (DELTASONE) 20 MG tablet, hydrOXYzine (ATARAX/VISTARIL) 50 MG tablet   -discussed potential risks/benefits of steroids, indicated due to lesions on face and widespread reaction, orders per above, return precuaitons -Patient advised to return or notify a  doctor immediately if symptoms worsen or persist or new concerns arise.  Patient Instructions  -take steroids as directed  -use calamine lotion and itch medication if needed - do not scratch lesions  -follow up if any concerns or not resolved with treatment  -As we discussed, we have prescribed a new medication for you at this appointment. We discussed the common and serious potential adverse effects of this medication and you can review these and more with the pharmacist when you pick up your medication.  Please follow the instructions for use carefully and notify us immediately if you have  any problems taking this medication.      Colin Benton R.

## 2012-05-28 ENCOUNTER — Other Ambulatory Visit: Payer: Self-pay | Admitting: Internal Medicine

## 2013-02-24 ENCOUNTER — Telehealth: Payer: Self-pay | Admitting: Internal Medicine

## 2013-02-24 NOTE — Telephone Encounter (Signed)
Patient Information:  Caller Name: [name]  Phone: [phone]  Patient: Christine Leon, Christine Leon  Gender: Female  DOB: 1951-04-17  Age: 62 Years  PCP: Phoebe Sharps (Adults only)  Office Follow Up:  Does the office need to follow up with this patient?: No  Instructions For The Office: N/A  RN Note:  RN advised to be seen at  nearest UC/ ED for evaluation and pt agreed. Location info given for [facility]  Symptoms  Reason For Call & Symptoms: Today, 02[place]24/2013, [place] onset  vomiting with diarrhea  02/22/2013  -- She had 3-4 vomiting episodes on 02/22/2013, but none since , but up to  20   watery stools on 02/23/2013  and 4 stools this morning.  Reviewed Health History In EMR: Yes  Reviewed Medications In EMR: Yes  Reviewed Allergies In EMR: Yes  Reviewed Surgeries / Procedures: Yes  Date of Onset of Symptoms: 02/22/2013  Guideline(s) Used:  Diarrhea  Diarrhea  Disposition Per Guideline:   Go to Office Now  Reason For Disposition Reached:   Age > 60 years and has had > 6 diarrhea stools in past 24 hours  Advice Given:  Fluids:  Drink more fluids, at least 8-10 glasses (8 oz or 240 ml) daily.  Fluids:  Drink more fluids, at least 8-10 glasses (8 oz or 240 ml) daily.  For example: sports drinks, diluted fruit juices, soft drinks.  RN Overrode Recommendation:  Go To [facility]  Pt is out of town and advised UC

## 2013-02-27 ENCOUNTER — Ambulatory Visit (INDEPENDENT_AMBULATORY_CARE_PROVIDER_SITE_OTHER): Payer: BC Managed Care – PPO | Admitting: Family Medicine

## 2013-02-27 VITALS — BP 140/86 | Temp 97.8°F | Wt 187.0 lb

## 2013-02-27 DIAGNOSIS — K529 Noninfective gastroenteritis and colitis, unspecified: Secondary | ICD-10-CM

## 2013-02-27 DIAGNOSIS — K5289 Other specified noninfective gastroenteritis and colitis: Secondary | ICD-10-CM

## 2013-02-27 MED ORDER — ONDANSETRON HCL 4 MG PO TABS: 4.0000 mg | ORAL_TABLET | Freq: Three times a day (TID) | ORAL | Status: DC | PRN

## 2013-02-27 NOTE — Progress Notes (Signed)
Chief Complaint  Patient presents with  . Diarrhea    HPI:  Acute visit for "stomach bug": -started -symptoms: start about 4-5 days ago, diarrhea - now resolving, nausea vomiting - nausea resolving, did have aches and fatigue, some acid reflux since -getting better, but still with sensitive stomach -daughter sick with same symptoms -denies fevers, blood in stools, abd pain   ROS: See pertinent positives and negatives per HPI.  Past Medical History  Diagnosis Date  . Hypertension   . Allergy     Family History  Problem Relation Age of Onset  . Hypertension Mother   . Coronary artery disease Father     History   Social History  . Marital Status: Married    Spouse Name: N/A    Number of Children: N/A  . Years of Education: N/A   Social History Main Topics  . Smoking status: Former Research scientist (life sciences)  . Smokeless tobacco: Not on file  . Alcohol Use: Yes  . Drug Use: No  . Sexual Activity:    Other Topics Concern  . Not on file   Social History Narrative  . No narrative on file    Current outpatient prescriptions:aspirin 81 MG tablet, Take 81 mg by mouth daily.  , Disp: , Rfl: ;  estradiol (VIVELLE-DOT) 0.0375 MG/24HR, Place 1 patch onto the skin twice a week.  , Disp: , Rfl: ;  fexofenadine (ALLEGRA) 60 MG tablet, Take 60 mg by mouth 2 (two) times daily as needed.  , Disp: , Rfl: ;  fish oil-omega-3 fatty acids 1000 MG capsule, Take 1 g by mouth daily.  , Disp: , Rfl:  glucosamine-chondroitin 500-400 MG tablet, Take 1 tablet by mouth daily.  , Disp: , Rfl: ;  hydrOXYzine (ATARAX/VISTARIL) 50 MG tablet, Take 1 tablet (50 mg total) by mouth 3 (three) times daily as needed for itching., Disp: 30 tablet, Rfl: 0;  lisinopril-hydrochlorothiazide (PRINZIDE,ZESTORETIC) 20-25 MG per tablet, TAKE 1 TABLET BY MOUTH EVERY DAY, Disp: 90 tablet, Rfl: 3 ondansetron (ZOFRAN) 4 MG tablet, Take 1 tablet (4 mg total) by mouth every 8 (eight) hours as needed for nausea., Disp: 20 tablet, Rfl:  0  EXAM:  Filed Vitals:   02/27/13 0853  BP: 140/86  Temp: 97.8 F (36.6 C)    Body mass index is 31.12 kg/(m^2).  GENERAL: vitals reviewed and listed above, alert, oriented, appears well hydrated and in no acute distress  HEENT: atraumatic, conjunttiva clear, no obvious abnormalities on inspection of external nose and ears  NECK: no obvious masses on inspection  LUNGS: clear to auscultation bilaterally, no wheezes, rales or rhonchi, good air movement  CV: HRRR, no peripheral edema  ABD: BS+, soft, NTTP, no rebound or guarding  MS: moves all extremities without noticeable abnormality  PSYCH: pleasant and cooperative, no obvious depression or anxiety  ASSESSMENT AND PLAN:  Discussed the following assessment and plan:  Gastroenteritis - Plan: ondansetron (ZOFRAN) 4 MG tablet  -resolving gastroenteritis likely -supportive care, diet changes, prilosec -Patient advised to return or notify a doctor immediately if symptoms worsen or persist or new concerns arise.  Patient Instructions  -no dairy for about 1 week  -plenty of fluids  -prilosec 20 mg daily for one month  -bland diet  -follow up with PCP in a few months and as needed     Jensyn Cambria R.

## 2013-02-27 NOTE — Patient Instructions (Signed)
-  no dairy for about 1 week  -plenty of fluids  -prilosec 20 mg daily for one month  -bland diet  -follow up with PCP in a few months and as needed

## 2013-03-07 ENCOUNTER — Encounter: Payer: Self-pay | Admitting: Family Medicine

## 2013-03-07 ENCOUNTER — Ambulatory Visit (INDEPENDENT_AMBULATORY_CARE_PROVIDER_SITE_OTHER): Payer: BC Managed Care – PPO | Admitting: Family Medicine

## 2013-03-07 VITALS — BP 132/90 | Temp 97.8°F | Wt 188.0 lb

## 2013-03-07 DIAGNOSIS — H6981 Other specified disorders of Eustachian tube, right ear: Secondary | ICD-10-CM

## 2013-03-07 DIAGNOSIS — H6980 Other specified disorders of Eustachian tube, unspecified ear: Secondary | ICD-10-CM

## 2013-03-07 DIAGNOSIS — H6990 Unspecified Eustachian tube disorder, unspecified ear: Secondary | ICD-10-CM

## 2013-03-07 DIAGNOSIS — J329 Chronic sinusitis, unspecified: Secondary | ICD-10-CM

## 2013-03-07 DIAGNOSIS — J309 Allergic rhinitis, unspecified: Secondary | ICD-10-CM

## 2013-03-07 MED ORDER — FLUTICASONE PROPIONATE 50 MCG/ACT NA SUSP: 2.0000 | Freq: Every day | NASAL | Status: DC

## 2013-03-07 MED ORDER — AZITHROMYCIN 250 MG PO TABS: ORAL_TABLET | ORAL | Status: DC

## 2013-03-07 NOTE — Patient Instructions (Signed)
-  afrin for 3-4 days (STOP after 3-4 days)  -nasal steroid (flonase) 2 sprays each nostril for 21 days  -As we discussed, we have prescribed a new medication for you at this appointment (azithromycin). We discussed the common and serious potential adverse effects of this medication and you can review these and more with the pharmacist when you pick up your medication.  Please follow the instructions for use carefully and notify us immediately if you have any problems taking this medication.   -follow up as needed

## 2013-03-07 NOTE — Progress Notes (Signed)
Chief Complaint  Patient presents with  . Otalgia    right ear;     HPI:  1) Ear Pain: -started: abut 1 week ago -reports: pain in R, feels full, feels stopped up, nasal congestion - hx of allergies take allegra daily, R max sinus pain for last few days -denies: fevers,chills, NVD, SOB  ROS: See pertinent positives and negatives per HPI.  Past Medical History  Diagnosis Date  . Hypertension   . Allergy     Past Surgical History  Procedure Laterality Date  . Cholecystectomy    . Abdominal hysterectomy    . Tonsillectomy    . Oophorectomy      Family History  Problem Relation Age of Onset  . Hypertension Mother   . Coronary artery disease Father     History   Social History  . Marital Status: Married    Spouse Name: N/A    Number of Children: N/A  . Years of Education: N/A   Social History Main Topics  . Smoking status: Former Research scientist (life sciences)  . Smokeless tobacco: None  . Alcohol Use: Yes  . Drug Use: No  . Sexual Activity:    Other Topics Concern  . None   Social History Narrative  . None    Current outpatient prescriptions:aspirin 81 MG tablet, Take 81 mg by mouth daily.  , Disp: , Rfl: ;  estradiol (VIVELLE-DOT) 0.0375 MG/24HR, Place 1 patch onto the skin twice a week.  , Disp: , Rfl: ;  fexofenadine (ALLEGRA) 60 MG tablet, Take 60 mg by mouth 2 (two) times daily as needed.  , Disp: , Rfl: ;  fish oil-omega-3 fatty acids 1000 MG capsule, Take 1 g by mouth daily.  , Disp: , Rfl:  glucosamine-chondroitin 500-400 MG tablet, Take 1 tablet by mouth daily.  , Disp: , Rfl: ;  hydrOXYzine (ATARAX/VISTARIL) 50 MG tablet, Take 1 tablet (50 mg total) by mouth 3 (three) times daily as needed for itching., Disp: 30 tablet, Rfl: 0;  lisinopril-hydrochlorothiazide (PRINZIDE,ZESTORETIC) 20-25 MG per tablet, TAKE 1 TABLET BY MOUTH EVERY DAY, Disp: 90 tablet, Rfl: 3 ondansetron (ZOFRAN) 4 MG tablet, Take 1 tablet (4 mg total) by mouth every 8 (eight) hours as needed for nausea.,  Disp: 20 tablet, Rfl: 0;  azithromycin (ZITHROMAX) 250 MG tablet, 2 tab on first day, 1 tab daily for 4 days, Disp: 6 tablet, Rfl: 0;  fluticasone (FLONASE) 50 MCG/ACT nasal spray, Place 2 sprays into the nose daily., Disp: 16 g, Rfl: 0  EXAM:  Filed Vitals:   03/07/13 1514  BP: 132/90  Temp: 97.8 F (36.6 C)    Body mass index is 31.28 kg/(m^2).  GENERAL: vitals reviewed and listed above, alert, oriented, appears well hydrated and in no acute distress  HEENT: atraumatic, conjunttiva clear, no obvious abnormalities on inspection of external nose and ears, normal appearance of ear canals and TMs except clear effusion R, clear thick nasal congestion, mild post oropharyngeal erythema with PND, no tonsillar edema or exudate, R max sinus TTP  NECK: no obvious masses on inspection  LUNGS: clear to auscultation bilaterally, no wheezes, rales or rhonchi, good air movement  CV: HRRR, no peripheral edema  MS: moves all extremities without noticeable abnormality  PSYCH: pleasant and cooperative, no obvious depression or anxiety  ASSESSMENT AND PLAN:  Discussed the following assessment and plan:  Sinusitis - Plan: azithromycin (ZITHROMAX) 250 MG tablet  Allergic rhinitis - Plan: fluticasone (FLONASE) 50 MCG/ACT nasal spray  Eustachian tube dysfunction, right -  Plan: fluticasone (FLONASE) 50 MCG/ACT nasal spray  -Recommendations per orders adn instructions, risks and use of medications and return precautions discussed. -Patient advised to return or notify a doctor immediately if symptoms worsen or persist or new concerns arise.  Patient Instructions  -afrin for 3-4 days (STOP after 3-4 days)  -nasal steroid (flonase) 2 sprays each nostril for 21 days  -As we discussed, we have prescribed a new medication for you at this appointment (azithromycin). We discussed the common and serious potential adverse effects of this medication and you can review these and more with the pharmacist when  you pick up your medication.  Please follow the instructions for use carefully and notify us immediately if you have any problems taking this medication.   -follow up as needed     Arelie Kuzel, Jarrett Soho R.

## 2013-05-13 LAB — HM MAMMOGRAPHY

## 2013-05-16 ENCOUNTER — Other Ambulatory Visit: Payer: Self-pay

## 2013-05-16 DIAGNOSIS — Z1231 Encounter for screening mammogram for malignant neoplasm of breast: Secondary | ICD-10-CM

## 2013-05-22 ENCOUNTER — Ambulatory Visit: Payer: BC Managed Care – PPO | Admitting: Internal Medicine

## 2013-06-02 ENCOUNTER — Ambulatory Visit (INDEPENDENT_AMBULATORY_CARE_PROVIDER_SITE_OTHER): Payer: BC Managed Care – PPO | Admitting: Internal Medicine

## 2013-06-02 ENCOUNTER — Encounter: Payer: Self-pay | Admitting: Internal Medicine

## 2013-06-02 VITALS — BP 140/90 | HR 72 | Temp 98.1°F | Ht 65.0 in | Wt 184.0 lb

## 2013-06-02 DIAGNOSIS — Z2911 Encounter for prophylactic immunotherapy for respiratory syncytial virus (RSV): Secondary | ICD-10-CM

## 2013-06-02 DIAGNOSIS — I10 Essential (primary) hypertension: Secondary | ICD-10-CM

## 2013-06-02 DIAGNOSIS — K900 Celiac disease: Secondary | ICD-10-CM

## 2013-06-02 LAB — LIPID PANEL
LDL Cholesterol: 88 mg/dL (ref 0–99)
Total CHOL/HDL Ratio: 3
Triglycerides: 98 mg/dL (ref 0.0–149.0)

## 2013-06-02 LAB — HEPATIC FUNCTION PANEL
ALT: 54 U/L — ABNORMAL HIGH (ref 0–35)
AST: 34 U/L (ref 0–37)
Alkaline Phosphatase: 55 U/L (ref 39–117)
Bilirubin, Direct: 0 mg/dL (ref 0.0–0.3)
Total Bilirubin: 0.9 mg/dL (ref 0.3–1.2)

## 2013-06-02 LAB — CBC WITH DIFFERENTIAL/PLATELET
Basophils Absolute: 0 10*3/uL (ref 0.0–0.1)
Basophils Relative: 0.6 % (ref 0.0–3.0)
Eosinophils Absolute: 0.1 10*3/uL (ref 0.0–0.7)
HCT: 41.7 % (ref 36.0–46.0)
Lymphocytes Relative: 39.4 % (ref 12.0–46.0)
Lymphs Abs: 2.2 10*3/uL (ref 0.7–4.0)
MCHC: 33.9 g/dL (ref 30.0–36.0)
Monocytes Relative: 6 % (ref 3.0–12.0)
Neutrophils Relative %: 52.3 % (ref 43.0–77.0)
RBC: 4.66 Mil/uL (ref 3.87–5.11)
RDW: 13.8 % (ref 11.5–14.6)
WBC: 5.7 10*3/uL (ref 4.5–10.5)

## 2013-06-02 LAB — BASIC METABOLIC PANEL
CO2: 30 mEq/L (ref 19–32)
Chloride: 103 mEq/L (ref 96–112)
Creatinine, Ser: 0.8 mg/dL (ref 0.4–1.2)
Potassium: 3.9 mEq/L (ref 3.5–5.1)
Sodium: 141 mEq/L (ref 135–145)

## 2013-06-02 NOTE — Assessment & Plan Note (Signed)
Adequate control Continue meds

## 2013-06-02 NOTE — Progress Notes (Signed)
htn- tolerating meds  Celiac sprue-tolerating diet and not having any sxs  Reviewed meds, pmh, shx, ("retiring soon")  Ros- no specific complaints  Exam- reviewed vitals  Well-developed well-nourished female in no acute distress. HEENT exam atraumatic, normocephalic, extraocular muscles are intact. Neck is supple. No jugular venous distention no thyromegaly. Chest clear to auscultation without increased work of breathing. Cardiac exam S1 and S2 are regular. Abdominal exam active bowel sounds, soft, nontender. Extremities no edema. Neurologic exam she is alert without any motor sensory deficits. Gait is normal.

## 2013-06-02 NOTE — Assessment & Plan Note (Signed)
Generally well controlled as long as she follows "the right diet"

## 2013-06-02 NOTE — Progress Notes (Signed)
Pre visit review using our clinic review tool, if applicable. No additional management support is needed unless otherwise documented below in the visit note. 

## 2013-06-16 ENCOUNTER — Ambulatory Visit
Admission: RE | Admit: 2013-06-16 | Discharge: 2013-06-16 | Disposition: A | Discharge status: Home / Self Care | Payer: BC Managed Care – PPO | Source: Ambulatory Visit

## 2013-06-16 ENCOUNTER — Other Ambulatory Visit: Payer: Self-pay | Admitting: Internal Medicine

## 2013-06-16 DIAGNOSIS — Z1231 Encounter for screening mammogram for malignant neoplasm of breast: Secondary | ICD-10-CM

## 2013-07-14 ENCOUNTER — Other Ambulatory Visit: Payer: Self-pay | Admitting: Internal Medicine

## 2013-10-25 ENCOUNTER — Encounter: Payer: Self-pay | Admitting: Physician Assistant

## 2013-10-25 ENCOUNTER — Ambulatory Visit (INDEPENDENT_AMBULATORY_CARE_PROVIDER_SITE_OTHER): Payer: BC Managed Care – PPO | Admitting: Physician Assistant

## 2013-10-25 VITALS — BP 160/84 | HR 79 | Temp 97.7°F | Resp 16 | Ht 65.0 in | Wt 187.0 lb

## 2013-10-25 DIAGNOSIS — J329 Chronic sinusitis, unspecified: Secondary | ICD-10-CM

## 2013-10-25 MED ORDER — AMOXICILLIN-POT CLAVULANATE 875-125 MG PO TABS: 1.0000 | ORAL_TABLET | Freq: Two times a day (BID) | ORAL | Status: DC

## 2013-10-25 NOTE — Progress Notes (Signed)
Subjective:    Patient ID: Christine Leon, female    DOB: 07-Aug-1950, 63 y.o.   MRN: [id number]  Cough Associated symptoms include postnasal drip. Pertinent negatives include no chest pain, chills, ear pain, fever, myalgias, rhinorrhea, sore throat, shortness of breath or wheezing.   Patient is a 63 year old Caucasian female presenting today with complaints of sinus congestion and cough over the past month. States that at the end of March she was seen at a [facility] for sinus congestion and pain where she was prescribed doxycycline for 2 weeks which she completed. She states at that time she was no longer feeling symptoms. 2 weeks ago she states that she began experiencing symptoms of sinus congestion and pain and coughing again. She has tried Alka-Seltzer cold without much relief. She also takes Human resources officer daily. At this time she is experiencing cough with some clear sputum reduction. She has no fevers chills nausea vomiting or diarrhea or shortness of breath at this time.  Review of Systems  Constitutional: Negative for fever, chills, diaphoresis, activity change, appetite change, fatigue and unexpected weight change.  HENT: Positive for congestion, postnasal drip and sinus pressure. Negative for drooling, ear pain, nosebleeds, rhinorrhea, sneezing and sore throat.   Respiratory: Positive for cough. Negative for chest tightness, shortness of breath and wheezing.   Cardiovascular: Negative for chest pain, palpitations and leg swelling.  Gastrointestinal: Negative for nausea, vomiting, abdominal pain, diarrhea and constipation.  Musculoskeletal: Negative for myalgias.  Skin: Negative for wound.  Hematological: Bruises/bleeds easily.  All other systems reviewed and are negative.  Past Medical History  Diagnosis Date  . Hypertension   . Allergy    Past Surgical History  Procedure Laterality Date  . Cholecystectomy    . Abdominal hysterectomy    . Tonsillectomy    . Oophorectomy      reports that she has quit smoking. She does not have any smokeless tobacco history on file. She reports that she drinks alcohol. She reports that she does not use illicit drugs. family history includes Coronary artery disease in her father; Hypertension in her mother. Allergies  Allergen Reactions  . Cephalexin     REACTION: rash   No changes to PFS history since last visit 06/02/2013, except for mammogram 06/16/2013.    Objective:   Physical Exam  Nursing note and vitals reviewed. Constitutional: She is oriented to person, place, and time. She appears well-developed and well-nourished. No distress.  HENT:  Head: Normocephalic and atraumatic.  Right Ear: External ear normal.  Left Ear: External ear normal.  Nose: Nose normal.  Mouth/Throat: Oropharynx is clear and moist. No oropharyngeal exudate.  Pain on palpation of right maxillary sinus. Left maxillary and bilateral frontal sinuses nonpainful to palpation.  Eyes: Conjunctivae and EOM are normal. Pupils are equal, round, and reactive to light. No scleral icterus.  Neck: Normal range of motion. Neck supple. No tracheal deviation present. No thyromegaly present.  Cardiovascular: Normal rate, regular rhythm, normal heart sounds and intact distal pulses.  Exam reveals no gallop and no friction rub.   No murmur heard. Pulmonary/Chest: Effort normal and breath sounds normal. No stridor. No respiratory distress. She has no wheezes. She has no rales. She exhibits no tenderness.  Abdominal: Soft. Bowel sounds are normal. There is no tenderness.  Musculoskeletal: Normal range of motion. She exhibits no edema.  Lymphadenopathy:    She has no cervical adenopathy.  Neurological: She is alert and oriented to person, place, and time. She has normal reflexes.  No cranial nerve deficit. Coordination normal.  Skin: Skin is warm and dry. No rash noted. She is not diaphoretic. No erythema. No pallor.  Psychiatric: She has a normal mood and affect. Her  behavior is normal. Judgment and thought content normal.   Filed Vitals:   10/25/13 0948 10/25/13 1027  BP: 170/104 160/84  Pulse: 79   Temp: 97.7 F (36.5 C)   TempSrc: Oral   Resp: 16   Height: 5' 5"  (1.651 m)   Weight: 187 lb (84.823 kg)   SpO2: 98%    Lab Results  Component Value Date   WBC 5.7 06/02/2013   HGB 14.1 06/02/2013   HCT 41.7 06/02/2013   PLT 280.0 06/02/2013   GLUCOSE 92 06/02/2013   CHOL 153 06/02/2013   TRIG 98.0 06/02/2013   HDL 45.70 06/02/2013   LDLCALC 88 06/02/2013   ALT 54* 06/02/2013   AST 34 06/02/2013   NA 141 06/02/2013   K 3.9 06/02/2013   CL 103 06/02/2013   CREATININE 0.8 06/02/2013   BUN 14 06/02/2013   CO2 30 06/02/2013   TSH 4.45 06/02/2013        Assessment & Plan:   [name] was seen today for nasal congestion and cough.  Diagnoses and associated orders for this visit:  Sinusitis - amoxicillin-clavulanate (AUGMENTIN) 875-125 MG per tablet; Take 1 tablet by mouth 2 (two) times daily.   force NON dairy fluids .    OTC Flonase OR Nasacort AQ 1 spray in each nostril twice a day as needed. Use the "crossover" technique into opposite nostril spraying toward opposite ear @ 45 degree angle, not straight up into nostril.   Plain OTC Allegra (NOT D )  160 daily , OTC Loratidine 10 mg , OR OTC Zyrtec 10 mg @ bedtime  as needed for itchy eyes & sneezing.  Avoid taking multiple medications containing decongestants due to their ability to raise BP.  Followup if symptoms worsen or do not improve despite treatment or fever develops.

## 2013-10-25 NOTE — Progress Notes (Signed)
Pre visit review using our clinic review tool, if applicable. No additional management support is needed unless otherwise documented below in the visit note. 

## 2013-10-25 NOTE — Patient Instructions (Signed)
Force NON dairy fluids .    OTC Flonase OR Nasacort AQ 1 spray in each nostril twice a day as needed. Use the "crossover" technique into opposite nostril spraying toward opposite ear @ 45 degree angle, not straight up into nostril.   OTC Allegra 160 daily , OTC Loratidine 10 mg , OR OTC Zyrtec 10 mg @ bedtime  as needed for itchy eyes & sneezing.  Do Not take multiple products containing decongestants due to they can increase BP.  Followup if symptoms do not improve or worsen despite treatment or fever develops.  Sinusitis Sinusitis is redness, soreness, and puffiness (inflammation) of the air pockets in the bones of your face (sinuses). The redness, soreness, and puffiness can cause air and mucus to get trapped in your sinuses. This can allow germs to grow and cause an infection.  HOME CARE   Drink enough fluids to keep your pee (urine) clear or pale yellow.  Use a humidifier in your home.  Run a hot shower to create steam in the bathroom. Sit in the bathroom with the door closed. Breathe in the steam 3 4 times a day.  Put a warm, moist washcloth on your face 3 4 times a day, or as told by your doctor.  Use salt water sprays (saline sprays) to wet the thick fluid in your nose. This can help the sinuses drain.  Only take medicine as told by your doctor. GET HELP RIGHT AWAY IF:   Your pain gets worse.  You have very bad headaches.  You are sick to your stomach (nauseous).  You throw up (vomit).  You are very sleepy (drowsy) all the time.  Your face is puffy (swollen).  Your vision changes.  You have a stiff neck.  You have trouble breathing. MAKE SURE YOU:   Understand these instructions.  Will watch your condition.  Will get help right away if you are not doing well or get worse. Document Released: 12/16/2007 Document Revised: 03/23/2012 Document Reviewed: 02/02/2012 [facility] Patient Information 2014 [place].

## 2013-11-13 ENCOUNTER — Encounter: Payer: Self-pay | Admitting: Family Medicine

## 2013-11-13 ENCOUNTER — Ambulatory Visit (INDEPENDENT_AMBULATORY_CARE_PROVIDER_SITE_OTHER): Payer: BC Managed Care – PPO | Admitting: Family Medicine

## 2013-11-13 VITALS — BP 120/80 | Temp 98.6°F | Wt 186.0 lb

## 2013-11-13 DIAGNOSIS — J45909 Unspecified asthma, uncomplicated: Secondary | ICD-10-CM

## 2013-11-13 DIAGNOSIS — J309 Allergic rhinitis, unspecified: Secondary | ICD-10-CM

## 2013-11-13 MED ORDER — HYDROCODONE-HOMATROPINE 5-1.5 MG/5ML PO SYRP: 5.0000 mL | ORAL_SOLUTION | Freq: Three times a day (TID) | ORAL | Status: DC | PRN

## 2013-11-13 MED ORDER — PREDNISONE 20 MG PO TABS: ORAL_TABLET | ORAL | Status: DC

## 2013-11-13 NOTE — Progress Notes (Signed)
   Subjective:    Patient ID: Christine Leon, female    DOB: Dec 31, 1950, 63 y.o.   MRN: [id number]  HPI Christine Leon is a 63 year old female nonsmoker who works in the English as a second language teacher who comes in today with a two-month history of head congestion postnasal drip and cough  She has perennial allergic rhinitis and takes Human resources officer on a regular basis but generally controls her symptoms. In March she developed coughing and symptoms of allergic rhinitis. She is around a lot of dust. Also she was in a moldy house but this was after her symptoms started.  [facility] and then came here and was given a prescription for Augmentin neither of which helped   Review of Systems    review of systems negative Objective:   Physical Exam  Well-developed well-nourished female no acute distress vital signs stable she's afebrile HEENT were negative except for 3+ nasal edema neck was supple no adenopathy lungs are clear      Assessment & Plan:  Allergic rhinitis with reactive airway disease.......Marland Kitchen

## 2013-11-13 NOTE — Patient Instructions (Signed)
Prednisone 20 mg......... 2 tabs for 3 days or until you feel a lot better and then taper as outlined  Hydromet..........Marland Kitchen

## 2013-11-13 NOTE — Progress Notes (Signed)
Pre visit review using our clinic review tool, if applicable. No additional management support is needed unless otherwise documented below in the visit note. 

## 2013-11-24 ENCOUNTER — Encounter (HOSPITAL_COMMUNITY): Payer: Self-pay | Admitting: Emergency Medicine

## 2013-11-24 ENCOUNTER — Emergency Department (HOSPITAL_COMMUNITY)
Admission: EM | Admit: 2013-11-24 | Discharge: 2013-11-24 | Disposition: A | Discharge status: Home / Self Care | Payer: BC Managed Care – PPO | Attending: Emergency Medicine | Admitting: Emergency Medicine

## 2013-11-24 ENCOUNTER — Emergency Department (HOSPITAL_COMMUNITY): Payer: BC Managed Care – PPO

## 2013-11-24 DIAGNOSIS — Z79899 Other long term (current) drug therapy: Secondary | ICD-10-CM | POA: Insufficient documentation

## 2013-11-24 DIAGNOSIS — T22119A Burn of first degree of unspecified forearm, initial encounter: Secondary | ICD-10-CM | POA: Insufficient documentation

## 2013-11-24 DIAGNOSIS — Y92410 Unspecified street and highway as the place of occurrence of the external cause: Secondary | ICD-10-CM | POA: Insufficient documentation

## 2013-11-24 DIAGNOSIS — Y9389 Activity, other specified: Secondary | ICD-10-CM | POA: Insufficient documentation

## 2013-11-24 DIAGNOSIS — T300 Burn of unspecified body region, unspecified degree: Secondary | ICD-10-CM

## 2013-11-24 DIAGNOSIS — IMO0002 Reserved for concepts with insufficient information to code with codable children: Secondary | ICD-10-CM | POA: Insufficient documentation

## 2013-11-24 DIAGNOSIS — Z7982 Long term (current) use of aspirin: Secondary | ICD-10-CM | POA: Insufficient documentation

## 2013-11-24 DIAGNOSIS — S199XXA Unspecified injury of neck, initial encounter: Secondary | ICD-10-CM

## 2013-11-24 DIAGNOSIS — S0993XA Unspecified injury of face, initial encounter: Secondary | ICD-10-CM | POA: Insufficient documentation

## 2013-11-24 DIAGNOSIS — I10 Essential (primary) hypertension: Secondary | ICD-10-CM | POA: Insufficient documentation

## 2013-11-24 DIAGNOSIS — S0990XA Unspecified injury of head, initial encounter: Secondary | ICD-10-CM | POA: Insufficient documentation

## 2013-11-24 DIAGNOSIS — Z23 Encounter for immunization: Secondary | ICD-10-CM | POA: Insufficient documentation

## 2013-11-24 DIAGNOSIS — Z87891 Personal history of nicotine dependence: Secondary | ICD-10-CM | POA: Insufficient documentation

## 2013-11-24 MED ORDER — OXYCODONE-ACETAMINOPHEN 5-325 MG PO TABS
1.0000 | ORAL_TABLET | Freq: Four times a day (QID) | ORAL | Status: DC | PRN
Start: 2013-11-24 — End: 2014-08-15

## 2013-11-24 MED ORDER — TETANUS-DIPHTH-ACELL PERTUSSIS 5-2.5-18.5 LF-MCG/0.5 IM SUSP
0.5000 mL | Freq: Once | INTRAMUSCULAR | Status: AC
  Administered 2013-11-24: 0.5 mL via INTRAMUSCULAR
  Filled 2013-11-24: qty 0.5

## 2013-11-24 MED ORDER — ACETAMINOPHEN 325 MG PO TABS
650.0000 mg | ORAL_TABLET | Freq: Once | ORAL | Status: AC
  Administered 2013-11-24: 650 mg via ORAL
  Filled 2013-11-24: qty 2

## 2013-11-24 MED ORDER — BACITRACIN ZINC 500 UNIT/GM EX OINT: 1.0000 "application " | TOPICAL_OINTMENT | Freq: Two times a day (BID) | CUTANEOUS | Status: DC
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
## 2013-11-24 NOTE — ED Notes (Signed)
c-collar cleared by Dr. Aline Brochure.
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
## 2013-11-24 NOTE — ED Notes (Signed)
Pt to radiology.
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
## 2013-11-24 NOTE — ED Notes (Signed)
Pt assisted to void.
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
## 2014-01-03 ENCOUNTER — Other Ambulatory Visit: Payer: Self-pay | Admitting: Family Medicine

## 2014-03-18 ENCOUNTER — Other Ambulatory Visit: Payer: Self-pay | Admitting: Internal Medicine

## 2014-06-05 ENCOUNTER — Other Ambulatory Visit: Payer: Self-pay

## 2014-06-05 DIAGNOSIS — Z1231 Encounter for screening mammogram for malignant neoplasm of breast: Secondary | ICD-10-CM

## 2014-06-27 ENCOUNTER — Ambulatory Visit
Admission: RE | Admit: 2014-06-27 | Discharge: 2014-06-27 | Disposition: A | Discharge status: Home / Self Care | Payer: BC Managed Care – PPO | Source: Ambulatory Visit

## 2014-06-27 DIAGNOSIS — Z1231 Encounter for screening mammogram for malignant neoplasm of breast: Secondary | ICD-10-CM

## 2014-07-02 ENCOUNTER — Other Ambulatory Visit: Payer: Self-pay | Admitting: Family Medicine

## 2014-07-02 DIAGNOSIS — R928 Other abnormal and inconclusive findings on diagnostic imaging of breast: Secondary | ICD-10-CM

## 2014-07-10 ENCOUNTER — Other Ambulatory Visit: Payer: Self-pay | Admitting: Family Medicine

## 2014-07-10 ENCOUNTER — Other Ambulatory Visit (HOSPITAL_COMMUNITY): Payer: Self-pay | Admitting: Physician Assistant

## 2014-07-10 ENCOUNTER — Other Ambulatory Visit: Payer: Self-pay

## 2014-07-10 ENCOUNTER — Ambulatory Visit (HOSPITAL_COMMUNITY)
Admission: RE | Admit: 2014-07-10 | Discharge: 2014-07-10 | Disposition: A | Discharge status: Home / Self Care | Payer: BC Managed Care – PPO | Source: Ambulatory Visit | Attending: Cardiology | Admitting: Cardiology

## 2014-07-10 ENCOUNTER — Other Ambulatory Visit (HOSPITAL_COMMUNITY): Payer: Self-pay | Admitting: *Deleted

## 2014-07-10 DIAGNOSIS — S83282D Other tear of lateral meniscus, current injury, left knee, subsequent encounter: Secondary | ICD-10-CM

## 2014-07-10 DIAGNOSIS — M79605 Pain in left leg: Secondary | ICD-10-CM

## 2014-07-10 DIAGNOSIS — Y33XXXD Other specified events, undetermined intent, subsequent encounter: Secondary | ICD-10-CM | POA: Insufficient documentation

## 2014-07-10 DIAGNOSIS — R928 Other abnormal and inconclusive findings on diagnostic imaging of breast: Secondary | ICD-10-CM

## 2014-07-10 DIAGNOSIS — M79609 Pain in unspecified limb: Secondary | ICD-10-CM

## 2014-07-10 NOTE — Progress Notes (Signed)
Left Lower Extremity Venous Duplex Completed. No evidence for DVT or SVT. [place]

## 2014-07-16 ENCOUNTER — Ambulatory Visit
Admission: RE | Admit: 2014-07-16 | Discharge: 2014-07-16 | Disposition: A | Discharge status: Home / Self Care | Payer: 59 | Source: Ambulatory Visit | Attending: Family Medicine | Admitting: Family Medicine

## 2014-07-16 DIAGNOSIS — R928 Other abnormal and inconclusive findings on diagnostic imaging of breast: Secondary | ICD-10-CM

## 2014-07-19 ENCOUNTER — Encounter: Payer: Self-pay | Admitting: Family Medicine

## 2014-07-19 ENCOUNTER — Ambulatory Visit (INDEPENDENT_AMBULATORY_CARE_PROVIDER_SITE_OTHER): Payer: 59 | Admitting: Family Medicine

## 2014-07-19 VITALS — BP 120/62 | Temp 97.7°F | Wt 191.0 lb

## 2014-07-19 DIAGNOSIS — M25562 Pain in left knee: Secondary | ICD-10-CM

## 2014-07-19 DIAGNOSIS — Z9889 Other specified postprocedural states: Secondary | ICD-10-CM

## 2014-07-19 DIAGNOSIS — E034 Atrophy of thyroid (acquired): Secondary | ICD-10-CM

## 2014-07-19 DIAGNOSIS — E039 Hypothyroidism, unspecified: Secondary | ICD-10-CM | POA: Insufficient documentation

## 2014-07-19 DIAGNOSIS — E038 Other specified hypothyroidism: Secondary | ICD-10-CM

## 2014-07-19 NOTE — Progress Notes (Signed)
Garret Reddish, MD Phone: [phone]  Subjective:   Christine Leon is a 64 y.o. year old very pleasant female patient who presents with the following:  Left knee pain/torn meniscus-improved after arthroscopic repair Dr. Hart Robinsons [facility] on December 22nd completed torn meniscus repair on left leg arthroscopic. Compression stockings for 3 weeks. When takes them off, has aching in calves. Had a DVT evaluation which was negative.   Needs PT referral for [facility].   ROS-denies swelling of leg or erythema  Hypothyroidism Improving control suspected on levothyroxine 75 mcg as energy has improved. This was diagnosed after ptaient was sent to endocrine Dr. Chalmers Cater from dermatology for concern of hirsutism. Patient would like to have care transitioned to our practice but has upcoming appointment with endocrine.  ROS- denies hair or nail changes or weight changes  Past Medical History-reactive airway disease, celiac sprue, seasonal allergies, history hypertension  Medications- reviewed and updated Current Outpatient Prescriptions  Medication Sig Dispense Refill  . ALPHA LIPOIC ACID PO Take 1 tablet by mouth daily.    . Cholecalciferol (VITAMIN D-3 PO) Take 1,500 Units by mouth daily.    Marland Kitchen estradiol (VIVELLE-DOT) 0.05 MG/24HR patch Place 1 patch onto the skin every 7 (seven) days. Changes on Wednesday    . fexofenadine (ALLEGRA) 180 MG tablet Take 180 mg by mouth daily.    . fish oil-omega-3 fatty acids 1000 MG capsule Take 1 g by mouth daily.      Marland Kitchen glucosamine-chondroitin 500-400 MG tablet Take 1 tablet by mouth daily.      Marland Kitchen lisinopril-hydrochlorothiazide (PRINZIDE,ZESTORETIC) 20-25 MG per tablet TAKE 1 TABLET BY MOUTH EVERY DAY 90 tablet 1  . oxyCODONE-acetaminophen (PERCOCET) 5-325 MG per tablet Take 1 tablet by mouth every 6 (six) hours as needed for moderate pain. 15 tablet 0  . vitamin B-12 (CYANOCOBALAMIN) 500 MCG tablet Take 500 mcg by mouth daily.    .  bacitracin ointment Apply 1 application topically 2 (two) times daily. (Patient not taking: Reported on 07/19/2014) 120 g 0   No current facility-administered medications for this visit.    Objective: BP 120/62 mmHg  Temp(Src) 97.7 F (36.5 C)  Wt 191 lb (86.637 kg) Gen: NAD, resting comfortably CV: RRR no murmurs rubs or gallops Lungs: CTAB no crackles, wheeze, rhonchi Ext: no edema, some left calf pain, equal calf size Left knee healing well. 3 incisions noted with no erythema or swelling.    Assessment/Plan:  Left knee pain/torn meniscus-improved after arthroscopic repair Improving after arthroscopic repair. Send for physical therapy to aid in healing process. Patient with calf pain but has had negative venous dopplers, could consider repeat if pain persists through next visit or certainly if worsens or leg swelling.   Hypothyroidism Started on levothyroxine 61mg for endocrine. Needs new referral with her change in insurance which was ordered today. I agreed to assume care of this and patient is going to have one more visit with endocrine to make sure no other follow up needed than TSh and have [place]    Return precautions advised. Has planned establish visit with me next month.    Orders Placed This Encounter  Procedures  . Ambulatory referral to Physical Therapy    Referral Priority:  Routine    Referral Type:  Physical Medicine    Referral Reason:  Specialty Services Required    Requested Specialty:  Physical Therapy    Number of Visits Requested:  1  . Ambulatory referral to Endocrinology  Referral Priority:  Routine    Referral Type:  Consultation    Referral Reason:  Specialty Services Required    Number of Visits Requested:  1    Meds ordered this encounter  Medications  . levothyroxine (SYNTHROID, LEVOTHROID) 75 MCG tablet    Sig: Take 75 mcg by mouth daily.    Refill:  3

## 2014-07-19 NOTE — Assessment & Plan Note (Signed)
Started on levothyroxine 28mg for endocrine. Needs new referral with her change in insurance which was ordered today. I agreed to assume care of this and patient is going to have one more visit with endocrine to make sure no other follow up needed than TSh and have records [place]

## 2014-07-19 NOTE — Patient Instructions (Signed)
Great to meet you.   Glad your energy is better on thyroid medicine-we will update our computer to reflect the change.   Referred you to physical therapy for your knee

## 2014-07-25 ENCOUNTER — Encounter: Payer: Self-pay | Admitting: Family Medicine

## 2014-07-25 ENCOUNTER — Telehealth: Payer: Self-pay | Admitting: Internal Medicine

## 2014-07-25 DIAGNOSIS — M25562 Pain in left knee: Secondary | ICD-10-CM | POA: Insufficient documentation

## 2014-07-25 NOTE — Telephone Encounter (Signed)
Pt states [facility] called her last night to inform her they do not have a referral yet from our office.  pls advise

## 2014-07-25 NOTE — Telephone Encounter (Signed)
Pt aware.     Thank you

## 2014-07-25 NOTE — Telephone Encounter (Signed)
The  referral case information was transmitted on 07/25/2014 at 08:18 AM CST Your Referral Number is [id number] appt scheduled for 07-30-2014 @3 :00 pm   Hart Robinsons MD [address] [place], [place] [address] Phone: [phone] Faxed authorization to Dr Theda Sers office [phone]

## 2014-08-02 ENCOUNTER — Encounter: Payer: Self-pay | Admitting: Family Medicine

## 2014-08-15 ENCOUNTER — Ambulatory Visit (INDEPENDENT_AMBULATORY_CARE_PROVIDER_SITE_OTHER): Payer: 59 | Admitting: Family Medicine

## 2014-08-15 ENCOUNTER — Encounter: Payer: Self-pay | Admitting: Family Medicine

## 2014-08-15 VITALS — BP 122/74 | Temp 98.0°F | Wt 193.0 lb

## 2014-08-15 DIAGNOSIS — I10 Essential (primary) hypertension: Secondary | ICD-10-CM

## 2014-08-15 DIAGNOSIS — E669 Obesity, unspecified: Secondary | ICD-10-CM

## 2014-08-15 DIAGNOSIS — R232 Flushing: Secondary | ICD-10-CM | POA: Insufficient documentation

## 2014-08-15 DIAGNOSIS — M25562 Pain in left knee: Secondary | ICD-10-CM

## 2014-08-15 DIAGNOSIS — H33319 Horseshoe tear of retina without detachment, unspecified eye: Secondary | ICD-10-CM | POA: Insufficient documentation

## 2014-08-15 NOTE — Assessment & Plan Note (Signed)
excellent control on Lisinopril-hctz 20-64m.

## 2014-08-15 NOTE — Assessment & Plan Note (Signed)
Exercise had been limited by left knee pain which has now improved. We discussed trying elliptical or walking at least 10 minutes twice a week while she is still dealing with caring her father who is in hospice but ultimately goal of 150 minutes a week.

## 2014-08-15 NOTE — Progress Notes (Signed)
Christine Reddish, MD Phone: [phone]  Subjective:  Patient presents today to establish care with me as their new primary care provider. Patient was formerly a patient of Dr. Leanne Chang. Chief complaint-noted.   Hypertension-excellent control on Lisinopril-hctz 20-35m BP Readings from Last 3 Encounters:  08/15/14 122/74  07/19/14 120/62  11/24/13 169/89   Home BP monitoring-no Compliant with medications-yes without side effects ROS-Denies any CP, HA, SOB, blurry vision, LE edema.   Left Knee pain-improved/obesity-stable Prescription for therapy given last visit after arthroscopy. Patient never went to therapy and pain resolved without therapy. She was released by Dr. CTheda Sers Had been limited exercise previously due to this.  ROS-no hot/swollen joint  The following were reviewed and entered/updated in epic: Past Medical History  Diagnosis Date  . Hypertension   . Allergy    Patient Active Problem List   Diagnosis Date Noted  . Hypothyroidism 07/19/2014    Priority: Medium  . Essential hypertension 06/05/2007    Priority: Medium  . Counseling for estrogen replacement therapy 08/15/2014    Priority: Low  . Retinal tear 08/15/2014    Priority: Low  . Left knee pain 07/25/2014    Priority: Low  . CELIAC SPRUE 04/20/2008    Priority: Low  . Allergic rhinitis 07/20/2007    Priority: Low  . Obesity 08/15/2014   Past Surgical History  Procedure Laterality Date  . Cholecystectomy    . Abdominal hysterectomy      in 539s benign reasons  . Tonsillectomy    . Oophorectomy      in 472s . Arthroscopic knee surgery      2015 under Dr. CTheda Sers . Coccyx removal      after cheerleading accident    Family History  Problem Relation Age of Onset  . Hypertension Mother   . Coronary artery disease Father     720 . COPD Father     Medications- reviewed and updated Current Outpatient Prescriptions  Medication Sig Dispense Refill  . ALPHA LIPOIC ACID PO Take 1 tablet by  mouth daily.    . bacitracin ointment Apply 1 application topically 2 (two) times daily. 120 g 0  . Cholecalciferol (VITAMIN D-3 PO) Take 1,500 Units by mouth daily.    .Marland Kitchenestradiol (VIVELLE-DOT) 0.05 MG/24HR patch Place 1 patch onto the skin every 7 (seven) days. Changes on Wednesday    . fexofenadine (ALLEGRA) 180 MG tablet Take 180 mg by mouth daily.    . fish oil-omega-3 fatty acids 1000 MG capsule Take 1 g by mouth daily.      .Marland Kitchenglucosamine-chondroitin 500-400 MG tablet Take 1 tablet by mouth daily.      .Marland Kitchenlevothyroxine (SYNTHROID, LEVOTHROID) 75 MCG tablet Take 75 mcg by mouth daily.  3  . lisinopril-hydrochlorothiazide (PRINZIDE,ZESTORETIC) 20-25 MG per tablet TAKE 1 TABLET BY MOUTH EVERY DAY 90 tablet 1  . vitamin B-12 (CYANOCOBALAMIN) 500 MCG tablet Take 500 mcg by mouth daily.      Allergies-reviewed and updated Allergies  Allergen Reactions  . Gluten Meal     Has celic  . Wheat Bran     Has celic   . Cephalexin Hives    History   Social History  . Marital Status: Married    Spouse Name: N/A    Number of Children: N/A  . Years of Education: N/A   Social History Main Topics  . Smoking status: Former Smoker -- 0.15 packs/day for 25 years    Types: Cigarettes  Quit date: 07/13/2013  . Smokeless tobacco: None  . Alcohol Use: 0.6 oz/week    1 Not specified per week  . Drug Use: No  . Sexual Activity: None   Other Topics Concern  . None   Social History Narrative   Married (husband patient of Dr. 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 Oropharynx normal. Good dentition Neck: no thyromegaly CV: RRR no murmurs  rubs or gallops Lungs: CTAB no crackles, wheeze, rhonchi Abdomen: soft/nontender/nondistended/normal bowel sounds.  Ext: no edema Skin: warm, dry, no rash Neuro: grossly normal, moves all extremities, PERRLA-contacts in place   Assessment/Plan:  Essential hypertension excellent control on Lisinopril-hctz 20-25m.    Obesity-stable Left Knee pain-improved Exercise had been limited by left knee pain which has now improved. We discussed trying elliptical or walking at least 10 minutes twice a week while she is still dealing with caring her father who is in hospice but ultimately goal of 150 minutes a week.   6-12 months for CPE with updated labs at that time. Sees endocrine in June, we may start caring for thyroid after that point after discussion with endocrine.

## 2014-08-15 NOTE — Patient Instructions (Signed)
Let's see each other wtihin 6-12 months and schedule a physical at your convenience so we can update bloodwork.   Otherwise, I wouldn't make any changes except when able want you to get back into exercise

## 2014-09-11 ENCOUNTER — Other Ambulatory Visit: Payer: Self-pay | Admitting: Internal Medicine

## 2015-02-25 ENCOUNTER — Telehealth: Payer: Self-pay | Admitting: Family Medicine

## 2015-02-25 MED ORDER — LISINOPRIL-HYDROCHLOROTHIAZIDE 20-25 MG PO TABS: 1.0000 | ORAL_TABLET | Freq: Every day | ORAL | Status: DC

## 2015-02-25 MED ORDER — LEVOTHYROXINE SODIUM 75 MCG PO TABS: 75.0000 ug | ORAL_TABLET | Freq: Every day | ORAL | Status: DC

## 2015-02-25 NOTE — Telephone Encounter (Signed)
Medication refilled

## 2015-02-25 NOTE — Telephone Encounter (Signed)
Pt needs refill on levothyroxine 75 mcg and lisinopril-hctz 20-25 mg #90 each w/refills sent to [facility]. Pt is out

## 2015-05-22 DIAGNOSIS — E039 Hypothyroidism, unspecified: Secondary | ICD-10-CM | POA: Diagnosis not present

## 2015-06-26 DIAGNOSIS — L700 Acne vulgaris: Secondary | ICD-10-CM | POA: Diagnosis not present

## 2015-06-26 DIAGNOSIS — E039 Hypothyroidism, unspecified: Secondary | ICD-10-CM | POA: Diagnosis not present

## 2015-06-26 DIAGNOSIS — L680 Hirsutism: Secondary | ICD-10-CM | POA: Diagnosis not present

## 2015-10-27 ENCOUNTER — Other Ambulatory Visit: Payer: Self-pay | Admitting: Family Medicine

## 2015-11-30 ENCOUNTER — Other Ambulatory Visit: Payer: Self-pay | Admitting: Family Medicine

## 2015-12-04 ENCOUNTER — Other Ambulatory Visit: Payer: Self-pay | Admitting: Family Medicine

## 2016-03-07 ENCOUNTER — Other Ambulatory Visit: Payer: Self-pay | Admitting: Family Medicine

## 2016-04-14 ENCOUNTER — Ambulatory Visit (INDEPENDENT_AMBULATORY_CARE_PROVIDER_SITE_OTHER): Payer: Medicare HMO | Admitting: Family Medicine

## 2016-04-14 ENCOUNTER — Encounter: Payer: Self-pay | Admitting: Family Medicine

## 2016-04-14 VITALS — BP 136/78 | HR 70 | Temp 97.7°F | Ht 65.0 in | Wt 193.4 lb

## 2016-04-14 DIAGNOSIS — Z23 Encounter for immunization: Secondary | ICD-10-CM

## 2016-04-14 DIAGNOSIS — I10 Essential (primary) hypertension: Secondary | ICD-10-CM

## 2016-04-14 DIAGNOSIS — E034 Atrophy of thyroid (acquired): Secondary | ICD-10-CM | POA: Diagnosis not present

## 2016-04-14 DIAGNOSIS — Z0000 Encounter for general adult medical examination without abnormal findings: Secondary | ICD-10-CM | POA: Diagnosis not present

## 2016-04-14 DIAGNOSIS — Z7189 Other specified counseling: Secondary | ICD-10-CM

## 2016-04-14 MED ORDER — ESTRADIOL 0.05 MG/24HR TD PTTW: 1.0000 | MEDICATED_PATCH | TRANSDERMAL | 3 refills | Status: DC

## 2016-04-14 MED ORDER — LISINOPRIL-HYDROCHLOROTHIAZIDE 20-25 MG PO TABS: 1.0000 | ORAL_TABLET | Freq: Every day | ORAL | 3 refills | Status: DC

## 2016-04-14 NOTE — Assessment & Plan Note (Signed)
S: controlled on lisinopril-hctz 20-78m BP Readings from Last 3 Encounters:  04/14/16 136/78  08/15/14 122/74  07/19/14 120/62  a/p: refill current medicine- hopeful weight loss will get back into 120s

## 2016-04-14 NOTE — Progress Notes (Signed)
Pre visit review using our clinic review tool, if applicable. No additional management support is needed unless otherwise documented below in the visit note. 

## 2016-04-14 NOTE — Assessment & Plan Note (Signed)
advised weight loss. Thinking about getting into gym. Drinks 1-2 cokes a day. We discussed weaning coke, already thinks doing well on physical activity and reported diet is excellent- declines nutrition visit for further probi

## 2016-04-14 NOTE — Progress Notes (Signed)
Phone: [phone]  Subjective:  Patient presents today for their annual physical. Chief complaint-noted.   See problem oriented charting- ROS- full  review of systems was completed and negative including No shortness of breath. No headache or blurry vision. Does mention intermittent chest pain as noted below in discussion section  The following were reviewed and entered/updated in epic: Past Medical History:  Diagnosis Date  . Allergy   . Hypertension    Patient Active Problem List   Diagnosis Date Noted  . Obesity 08/15/2014    Priority: Medium  . Hypothyroidism 07/19/2014    Priority: Medium  . Essential hypertension 06/05/2007    Priority: Medium  . Counseling for estrogen replacement therapy 08/15/2014    Priority: Low  . Retinal tear 08/15/2014    Priority: Low  . Left knee pain 07/25/2014    Priority: Low  . CELIAC SPRUE 04/20/2008    Priority: Low  . Allergic rhinitis 07/20/2007    Priority: Low   Past Surgical History:  Procedure Laterality Date  . ABDOMINAL HYSTERECTOMY     in 76s. benign reasons  . arthroscopic knee surgery     2015 under Dr. Theda Sers  . CHOLECYSTECTOMY    . COCCYX REMOVAL     after cheerleading accident  . OOPHORECTOMY     in 58s  . TONSILLECTOMY      Family History  Problem Relation Age of Onset  . Hypertension Mother   . Coronary artery disease Father     59  . COPD Father     Medications- reviewed and updated Current Outpatient Prescriptions  Medication Sig Dispense Refill  . ALPHA LIPOIC ACID PO Take 1 tablet by mouth daily.    . Cholecalciferol (VITAMIN D-3 PO) Take 1,500 Units by mouth daily.    Marland Kitchen estradiol (VIVELLE-DOT) 0.05 MG/24HR patch Place 1 patch onto the skin every 7 (seven) days. Changes on Wednesday    . fexofenadine (ALLEGRA) 180 MG tablet Take 180 mg by mouth daily.    . fish oil-omega-3 fatty acids 1000 MG capsule Take 1 g by mouth daily.      Marland Kitchen glucosamine-chondroitin 500-400 MG tablet Take 1 tablet by  mouth daily.      Marland Kitchen levothyroxine (SYNTHROID, LEVOTHROID) 75 MCG tablet TAKE ONE TABLET BY MOUTH ONCE DAILY 30 tablet 5  . lisinopril-hydrochlorothiazide (PRINZIDE,ZESTORETIC) 20-25 MG tablet TAKE ONE TABLET BY MOUTH ONCE DAILY 90 tablet 1  . vitamin B-12 (CYANOCOBALAMIN) 500 MCG tablet Take 500 mcg by mouth daily.     No current facility-administered medications for this visit.     Allergies-reviewed and updated Allergies  Allergen Reactions  . Gluten Meal     Has celic  . Wheat Bran     Has celic   . Cephalexin Hives    Social History   Social History  . Marital status: Married    Spouse name: N/A  . Number of children: N/A  . Years of education: N/A   Social History Main Topics  . Smoking status: Former Smoker    Packs/day: 0.15    Years: 25.00    Types: Cigarettes    Quit date: 07/13/2013  . Smokeless tobacco: Not on file  . Alcohol use 0.6 oz/week    1 Standard drinks or equivalent per week  . Drug use: No  . Sexual activity: Not on file   Other Topics Concern  . Not on file   Social History Narrative   Married (husband patient of Dr. 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 Oropharynx normal Neck: no thyromegaly CV: RRR no murmurs rubs or gallops Lungs: CTAB no crackles, wheeze, rhonchi Abdomen: soft/nontender/nondistended/normal bowel sounds.   Ext: no edema Skin: warm, dry Neuro: grossly normal, moves all extremities, PERRLA  Declines breast exam- wants mammogram  only Hysterectomy and no vaginal issues- declines GYN eam  Assessment/Plan:  65 y.o. female presenting for annual physical.  Health Maintenance counseling: 1. Anticipatory guidance: Patient counseled regarding regular dental exams, eye exams (history of retinal tear), wearing seatbelts.  2. Risk factor reduction:  Advised patient of need for regular exercise and diet rich and fruits and vegetables to reduce risk of heart attack and stroke. Consider follow up visit 6 months with goal 10 lbs down 3. Immunizations/screenings/ancillary studies Immunization History  Administered Date(s) Administered  . Pneumococcal Conjugate-13 04/14/2016  . Td 11/07/2008  . Tdap 11/24/2013  . Zoster 06/02/2013   Health Maintenance Due  Topic Date Due  . Hepatitis C Screening - declines [birth year]  . HIV Screening - declines 05/20/1966  . PAP SMEAR - no cervix 07/14/2011  . PNA vac Low Risk Adult (1 of 2 - PCV13)- given today 05/21/2015   4. Cervical cancer screening- cervix removed with hysterectomy- fibroids.  5. Breast cancer screening-  breast exam today declined and mammogram 07/16/14 but advised 6 month follow up for probably benign findings- advised patient to return to breast center. Given handout 6. Colon cancer screening - 01/07/12 with 10 year follow up. Dr. Earlean Shawl previously- [place] next one likely. Ulcerative colitis history in college- usually 5 years- refer GI next year.   Status of chronic or acute concerns  Hypothyroidism- had been under care of Dr. Chalmers Cater on levothyroxine 75 mcg Lab Results  Component Value Date   TSH 4.45 06/02/2013   Goal down 10 lbs in 6 months. Wean coke to off.   Obesity advised weight loss. Thinking about getting into gym. Drinks 1-2 cokes a day. We discussed weaning coke, already thinks doing well on physical activity and reported diet is excellent- declines nutrition visit for further probi  Essential hypertension S: controlled on lisinopril-hctz 20-15m BP  Readings from Last 3 Encounters:  04/14/16 136/78  08/15/14 122/74  07/19/14 120/62  a/p: refill current medicine- hopeful weight loss will get back into 120s  Counseling for estrogen replacement therapy Still having hot flashes intense when not on estradiol. Refilled today- warned of risks particularly cancer risks and need for mammogram  At the end of the visit patient inquires about 2 things- first a refill of xanax as just ran out and recent stressors at home. She states she has been on this before. Review of med list did not note this medication. I encouraged patient to schedule a follow up appointment. She also states intermittently perhaps every 1-2 weeks gets a left sided aching in her chest usually while resting that lasts for a few hours  then goes away. Not usually worse after meals. no shortness of breath, left arm or neck pain, nausea, vomiting. Occasional mild dizziness with this. Chest doesn't feel tender. May line up to when ran out of xanax. Saw Dr. Wynonia Lawman in the past. I discussed a few options including a follo wup visit to investigate further with me or a referral to Dr. Wynonia Lawman. She declines further workup but agrees if worsening symptoms to go to ER or call 911 immediately  Return in about 6 months (around 10/13/2016).  Future fasting Orders Placed This Encounter  Procedures  . Pneumococcal conjugate vaccine 13-valent IM  . CBC    Standing Status:   Future    Standing Expiration Date:   04/14/2017  . Comprehensive metabolic panel    [place]    Standing Status:   Future    Standing Expiration Date:   04/14/2017  . Lipid panel    Standing Status:   Future    Standing Expiration Date:   04/14/2017  . TSH    Standing Status:   Future    Standing Expiration Date:   04/14/2017    Meds ordered this encounter  Medications  . estradiol (VIVELLE-DOT) 0.05 MG/24HR patch    Sig: Place 1 patch (0.05 mg total) onto the skin every 7 (seven) days. Changes on Wednesday    Dispense:   12 patch    Refill:  3  . lisinopril-hydrochlorothiazide (PRINZIDE,ZESTORETIC) 20-25 MG tablet    Sig: Take 1 tablet by mouth daily.    Dispense:  90 tablet    Refill:  3    Return precautions advised.   Garret Reddish, MD

## 2016-04-14 NOTE — Patient Instructions (Signed)
Meds ordered this encounter  Medications  . estradiol (VIVELLE-DOT) 0.05 MG/24HR patch    Sig: Place 1 patch (0.05 mg total) onto the skin every 7 (seven) days. Changes on Wednesday    Dispense:  12 patch    Refill:  3  . lisinopril-hydrochlorothiazide (PRINZIDE,ZESTORETIC) 20-25 MG tablet    Sig: Take 1 tablet by mouth daily.    Dispense:  90 tablet    Refill:  3   Estradiol certainly carries risks- definitely want to keep up with mammograms- see handout. Is reassuring that you have hysterectomy.   I would also like for you to sign up for an annual wellness visit with our nurse, Manuela Schwartz, who specializes in the annual wellness exam. This is a free benefit under medicare that may help [place] find additional ways to help you. Some highlights are reviewing medications, lifestyle, and doing a dementia screen. Would really love for your husband to do this as well.   Prevnar 13 today  Try to wean from Coke. Goal 10 lbs off in 6 months- see you then

## 2016-04-14 NOTE — Assessment & Plan Note (Signed)
Still having hot flashes intense when not on estradiol. Refilled today- warned of risks particularly cancer risks and need for mammogram

## 2016-04-15 ENCOUNTER — Other Ambulatory Visit (INDEPENDENT_AMBULATORY_CARE_PROVIDER_SITE_OTHER): Payer: Medicare HMO

## 2016-04-15 DIAGNOSIS — E034 Atrophy of thyroid (acquired): Secondary | ICD-10-CM

## 2016-04-15 DIAGNOSIS — I10 Essential (primary) hypertension: Secondary | ICD-10-CM

## 2016-04-15 LAB — LIPID PANEL
CHOLESTEROL: 155 mg/dL (ref 0–200)
HDL: 46.4 mg/dL (ref >39.00)
LDL Cholesterol: 84 mg/dL (ref 0–99)
NonHDL: 109.04
TRIGLYCERIDES: 126 mg/dL (ref 0.0–149.0)
Total CHOL/HDL Ratio: 3
VLDL: 25.2 mg/dL (ref 0.0–40.0)

## 2016-04-15 LAB — COMPREHENSIVE METABOLIC PANEL
ALBUMIN: 4.2 g/dL (ref 3.5–5.2)
ALK PHOS: 56 U/L (ref 39–117)
ALT: 48 U/L — ABNORMAL HIGH (ref 0–35)
AST: 27 U/L (ref 0–37)
BUN: 12 mg/dL (ref 6–23)
CALCIUM: 9 mg/dL (ref 8.4–10.5)
CHLORIDE: 102 meq/L (ref 96–112)
CO2: 29 mEq/L (ref 19–32)
Creatinine, Ser: 0.82 mg/dL (ref 0.40–1.20)
GFR: 74.15 mL/min (ref >60.00)
Glucose, Bld: 100 mg/dL — ABNORMAL HIGH (ref 70–99)
POTASSIUM: 3.8 meq/L (ref 3.5–5.1)
Sodium: 140 mEq/L (ref 135–145)
TOTAL PROTEIN: 6.8 g/dL (ref 6.0–8.3)
Total Bilirubin: 0.7 mg/dL (ref 0.2–1.2)

## 2016-04-15 LAB — TSH: TSH: 5.91 u[IU]/mL — AB (ref 0.35–4.50)

## 2016-04-15 LAB — CBC
HEMATOCRIT: 41.5 % (ref 36.0–46.0)
HEMOGLOBIN: 14.2 g/dL (ref 12.0–15.0)
MCHC: 34.2 g/dL (ref 30.0–36.0)
MCV: 89 fl (ref 78.0–100.0)
Platelets: 257 10*3/uL (ref 150.0–400.0)
RBC: 4.66 Mil/uL (ref 3.87–5.11)
RDW: 14.4 % (ref 11.5–15.5)
WBC: 6.6 10*3/uL (ref 4.0–10.5)

## 2016-04-16 ENCOUNTER — Telehealth: Payer: Self-pay

## 2016-04-16 NOTE — Telephone Encounter (Signed)
Received PA request from [facility] for Estradiol. PA submitted & is pending. Key: [id number]

## 2016-04-17 NOTE — Telephone Encounter (Signed)
PA approved & form faxed back to pharmacy.

## 2016-05-03 DIAGNOSIS — L089 Local infection of the skin and subcutaneous tissue, unspecified: Secondary | ICD-10-CM | POA: Diagnosis not present

## 2016-05-03 DIAGNOSIS — W57XXXA Bitten or stung by nonvenomous insect and other nonvenomous arthropods, initial encounter: Secondary | ICD-10-CM | POA: Diagnosis not present

## 2016-05-03 DIAGNOSIS — S40861A Insect bite (nonvenomous) of right upper arm, initial encounter: Secondary | ICD-10-CM | POA: Diagnosis not present

## 2016-05-03 DIAGNOSIS — S40261A Insect bite (nonvenomous) of right shoulder, initial encounter: Secondary | ICD-10-CM | POA: Diagnosis not present

## 2016-05-21 DIAGNOSIS — J0110 Acute frontal sinusitis, unspecified: Secondary | ICD-10-CM | POA: Diagnosis not present

## 2016-05-21 DIAGNOSIS — J209 Acute bronchitis, unspecified: Secondary | ICD-10-CM | POA: Diagnosis not present

## 2016-05-21 DIAGNOSIS — Z23 Encounter for immunization: Secondary | ICD-10-CM | POA: Diagnosis not present

## 2016-06-01 ENCOUNTER — Other Ambulatory Visit: Payer: Self-pay | Admitting: Family Medicine

## 2016-06-23 DIAGNOSIS — E039 Hypothyroidism, unspecified: Secondary | ICD-10-CM | POA: Diagnosis not present

## 2016-06-30 DIAGNOSIS — L700 Acne vulgaris: Secondary | ICD-10-CM | POA: Diagnosis not present

## 2016-06-30 DIAGNOSIS — L680 Hirsutism: Secondary | ICD-10-CM | POA: Diagnosis not present

## 2016-06-30 DIAGNOSIS — E039 Hypothyroidism, unspecified: Secondary | ICD-10-CM | POA: Diagnosis not present

## 2016-08-03 ENCOUNTER — Other Ambulatory Visit: Payer: Self-pay

## 2016-08-03 ENCOUNTER — Telehealth: Payer: Self-pay | Admitting: Family Medicine

## 2016-08-03 MED ORDER — ESTRADIOL 0.05 MG/24HR TD PTTW: 1.0000 | MEDICATED_PATCH | TRANSDERMAL | 3 refills | Status: DC

## 2016-08-03 NOTE — Telephone Encounter (Signed)
Pt needs a refill on vivelle-dot [facility]

## 2016-08-03 NOTE — Telephone Encounter (Signed)
Prescription sent to [facility] as requested.

## 2016-08-05 ENCOUNTER — Telehealth: Payer: Self-pay

## 2016-08-05 NOTE — Telephone Encounter (Signed)
Received PA request from [facility] for Estradiol 0.05 mg patch. PA submitted & is pending. Key: [id number]

## 2016-08-06 NOTE — Telephone Encounter (Signed)
PA approved. Form faxed back to pharmacy.

## 2016-08-12 ENCOUNTER — Ambulatory Visit (INDEPENDENT_AMBULATORY_CARE_PROVIDER_SITE_OTHER): Payer: Medicare HMO | Admitting: Adult Health

## 2016-08-12 VITALS — BP 118/64 | HR 103 | Temp 97.9°F | Ht 65.0 in | Wt 189.8 lb

## 2016-08-12 DIAGNOSIS — J0100 Acute maxillary sinusitis, unspecified: Secondary | ICD-10-CM

## 2016-08-12 MED ORDER — FLUTICASONE PROPIONATE 50 MCG/ACT NA SUSP: 2.0000 | Freq: Every day | NASAL | 6 refills | Status: AC

## 2016-08-12 MED ORDER — DOXYCYCLINE HYCLATE 100 MG PO CAPS: 100.0000 mg | ORAL_CAPSULE | Freq: Two times a day (BID) | ORAL | 0 refills | Status: DC

## 2016-08-12 MED ORDER — HYDROCODONE-HOMATROPINE 5-1.5 MG/5ML PO SYRP: 5.0000 mL | ORAL_SOLUTION | Freq: Three times a day (TID) | ORAL | 0 refills | Status: DC | PRN

## 2016-08-12 NOTE — Progress Notes (Signed)
Subjective:    Patient ID: Christine Leon, female    DOB: 1951/02/17, 66 y.o.   MRN: [id number]  URI   This is a new problem. The current episode started 1 to 4 weeks ago. There has been no fever. Associated symptoms include congestion, coughing, headaches, rhinorrhea and sinus pain. Pertinent negatives include no ear pain, nausea, sneezing, sore throat or wheezing. She has tried antihistamine and decongestant for the symptoms.    Review of Systems  Constitutional: Positive for fatigue. Negative for activity change, appetite change, chills and fever.  HENT: Positive for congestion, rhinorrhea, sinus pain and sinus pressure. Negative for ear pain, sneezing, sore throat and trouble swallowing.   Respiratory: Positive for cough. Negative for shortness of breath and wheezing.   Cardiovascular: Negative.   Gastrointestinal: Negative for nausea.  Neurological: Positive for headaches.   Past Medical History:  Diagnosis Date  . Allergy   . Hypertension     Social History   Social History  . Marital status: Married    Spouse name: N/A  . Number of children: N/A  . Years of education: N/A   Occupational History  . Not on file.   Social History Main Topics  . Smoking status: Former Smoker    Packs/day: 0.15    Years: 25.00    Types: Cigarettes    Quit date: 07/13/2013  . Smokeless tobacco: Not on file  . Alcohol use 0.6 oz/week    1 Standard drinks or equivalent per week  . Drug use: No  . Sexual activity: Not on file   Other Topics Concern  . Not on file   Social History Narrative   Married (husband patient of Dr. 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 ABDOMINAL HYSTERECTOMY     in 55s. fibroids. including cervix removal.  per patient was told no further pap smears after.   Marland Kitchen arthroscopic knee surgery     2015 under Dr. Theda Sers  . CHOLECYSTECTOMY    . COCCYX REMOVAL     after cheerleading accident  . OOPHORECTOMY     in 4s  . TONSILLECTOMY      Family History  Problem Relation Age of Onset  . Hypertension Mother   . Coronary artery disease Father     33  . COPD Father     Allergies  Allergen Reactions  . Gluten Meal     Has celic  . Wheat Bran     Has celic   . Cephalexin Hives    Current Outpatient Prescriptions on File Prior to Visit  Medication Sig Dispense Refill  . ALPHA LIPOIC ACID PO Take 1 tablet by mouth daily.    . Cholecalciferol (VITAMIN D-3 PO) Take 1,500 Units by mouth daily.    Marland Kitchen estradiol (VIVELLE-DOT) 0.05 MG/24HR patch Place 1 patch (0.05 mg total) onto the skin every 7 (seven) days. Changes on Wednesday 12 patch 3  . fexofenadine (ALLEGRA) 180 MG tablet Take 180 mg by mouth daily.    . fish oil-omega-3 fatty acids 1000 MG capsule Take 1 g by mouth daily.      Marland Kitchen glucosamine-chondroitin 500-400 MG tablet Take 1 tablet by mouth daily.      Marland Kitchen  levothyroxine (SYNTHROID, LEVOTHROID) 75 MCG tablet TAKE ONE TABLET BY MOUTH ONCE DAILY 90 tablet 1  . lisinopril-hydrochlorothiazide (PRINZIDE,ZESTORETIC) 20-25 MG tablet Take 1 tablet by mouth daily. 90 tablet 3  . vitamin B-12 (CYANOCOBALAMIN) 500 MCG tablet Take 500 mcg by mouth daily.     No current facility-administered medications on file prior to visit.     BP 118/64   Pulse (!) 103   Temp 97.9 F (36.6 C) (Oral)   Ht 5' 5"  (1.651 m)   Wt 189 lb 12.8 oz (86.1 kg)   SpO2 95%   BMI 31.58 kg/m       Objective:   Physical Exam  Constitutional: She is oriented to person, place, and time. She appears well-developed and well-nourished. No distress.  HENT:  Right Ear: Hearing, tympanic membrane, external ear and ear canal normal.  Left Ear: Hearing, tympanic membrane, external ear and ear canal normal.  Nose: Mucosal  edema and rhinorrhea present. Right sinus exhibits maxillary sinus tenderness. Right sinus exhibits no frontal sinus tenderness. Left sinus exhibits maxillary sinus tenderness. Left sinus exhibits no frontal sinus tenderness.  Mouth/Throat: Uvula is midline, oropharynx is clear and moist and mucous membranes are normal.  Eyes: Conjunctivae and EOM are normal. Pupils are equal, round, and reactive to light. Right eye exhibits no discharge. Left eye exhibits no discharge. No scleral icterus.  Neck: Normal range of motion. Neck supple. No thyromegaly present.  Cardiovascular: Normal rate, regular rhythm, normal heart sounds and intact distal pulses.  Exam reveals no gallop and no friction rub.   No murmur heard. Pulmonary/Chest: Effort normal and breath sounds normal. No respiratory distress. She has no wheezes. She has no rales. She exhibits no tenderness.  Lymphadenopathy:    She has no cervical adenopathy.  Neurological: She is alert and oriented to person, place, and time.  Skin: Skin is warm and dry. No rash noted. She is not diaphoretic. No erythema. No pallor.  Psychiatric: She has a normal mood and affect. Her behavior is normal. Judgment and thought content normal.  Nursing note and vitals reviewed.      Assessment & Plan:  1. Acute non-recurrent maxillary sinusitis - doxycycline (VIBRAMYCIN) 100 MG capsule; Take 1 capsule (100 mg total) by mouth 2 (two) times daily.  Dispense: 14 capsule; Refill: 0 - fluticasone (FLONASE) 50 MCG/ACT nasal spray; Place 2 sprays into both nostrils daily.  Dispense: 16 g; Refill: 6 - HYDROcodone-homatropine (HYCODAN) 5-1.5 MG/5ML syrup; Take 5 mLs by mouth every 8 (eight) hours as needed for cough.  Dispense: 120 mL; Refill: 0 - Follow up if no improvement  Dorothyann Peng, NP

## 2016-08-29 DIAGNOSIS — R05 Cough: Secondary | ICD-10-CM | POA: Diagnosis not present

## 2016-08-29 DIAGNOSIS — J309 Allergic rhinitis, unspecified: Secondary | ICD-10-CM | POA: Diagnosis not present

## 2016-09-07 DIAGNOSIS — E039 Hypothyroidism, unspecified: Secondary | ICD-10-CM | POA: Diagnosis not present

## 2016-10-13 ENCOUNTER — Ambulatory Visit: Payer: Medicare HMO | Admitting: Family Medicine

## 2016-10-30 ENCOUNTER — Ambulatory Visit: Payer: Medicare HMO | Admitting: Family Medicine

## 2016-12-03 DIAGNOSIS — H33303 Unspecified retinal break, bilateral: Secondary | ICD-10-CM | POA: Diagnosis not present

## 2016-12-03 DIAGNOSIS — H5213 Myopia, bilateral: Secondary | ICD-10-CM | POA: Diagnosis not present

## 2016-12-03 DIAGNOSIS — H2513 Age-related nuclear cataract, bilateral: Secondary | ICD-10-CM | POA: Diagnosis not present

## 2016-12-27 ENCOUNTER — Emergency Department (HOSPITAL_BASED_OUTPATIENT_CLINIC_OR_DEPARTMENT_OTHER)
Admission: EM | Admit: 2016-12-27 | Discharge: 2016-12-27 | Disposition: A | Discharge status: Home / Self Care | Payer: Medicare HMO | Attending: Emergency Medicine | Admitting: Emergency Medicine

## 2016-12-27 ENCOUNTER — Emergency Department (HOSPITAL_BASED_OUTPATIENT_CLINIC_OR_DEPARTMENT_OTHER): Payer: Medicare HMO

## 2016-12-27 ENCOUNTER — Encounter (HOSPITAL_BASED_OUTPATIENT_CLINIC_OR_DEPARTMENT_OTHER): Payer: Self-pay | Admitting: Emergency Medicine

## 2016-12-27 DIAGNOSIS — R911 Solitary pulmonary nodule: Secondary | ICD-10-CM

## 2016-12-27 DIAGNOSIS — R1031 Right lower quadrant pain: Secondary | ICD-10-CM

## 2016-12-27 DIAGNOSIS — Z79891 Long term (current) use of opiate analgesic: Secondary | ICD-10-CM | POA: Diagnosis not present

## 2016-12-27 DIAGNOSIS — Z79818 Long term (current) use of other agents affecting estrogen receptors and estrogen levels: Secondary | ICD-10-CM | POA: Insufficient documentation

## 2016-12-27 DIAGNOSIS — I10 Essential (primary) hypertension: Secondary | ICD-10-CM | POA: Diagnosis not present

## 2016-12-27 DIAGNOSIS — Z7901 Long term (current) use of anticoagulants: Secondary | ICD-10-CM | POA: Insufficient documentation

## 2016-12-27 DIAGNOSIS — K529 Noninfective gastroenteritis and colitis, unspecified: Secondary | ICD-10-CM | POA: Diagnosis not present

## 2016-12-27 DIAGNOSIS — Z79899 Other long term (current) drug therapy: Secondary | ICD-10-CM | POA: Diagnosis not present

## 2016-12-27 DIAGNOSIS — Z87891 Personal history of nicotine dependence: Secondary | ICD-10-CM | POA: Insufficient documentation

## 2016-12-27 LAB — CBC WITH DIFFERENTIAL/PLATELET
BASOS PCT: 0 %
Basophils Absolute: 0 10*3/uL (ref 0.0–0.1)
Eosinophils Absolute: 0.1 10*3/uL (ref 0.0–0.7)
Eosinophils Relative: 1 %
HEMATOCRIT: 38.4 % (ref 36.0–46.0)
Hemoglobin: 13.4 g/dL (ref 12.0–15.0)
Lymphocytes Relative: 25 %
Lymphs Abs: 1.7 10*3/uL (ref 0.7–4.0)
MCH: 30.8 pg (ref 26.0–34.0)
MCHC: 34.9 g/dL (ref 30.0–36.0)
MCV: 88.3 fL (ref 78.0–100.0)
MONO ABS: 0.4 10*3/uL (ref 0.1–1.0)
Monocytes Relative: 6 %
NEUTROS ABS: 4.6 10*3/uL (ref 1.7–7.7)
Neutrophils Relative %: 68 %
Platelets: 234 10*3/uL (ref 150–400)
RBC: 4.35 MIL/uL (ref 3.87–5.11)
RDW: 13.8 % (ref 11.5–15.5)
WBC: 6.8 10*3/uL (ref 4.0–10.5)

## 2016-12-27 LAB — URINALYSIS, ROUTINE W REFLEX MICROSCOPIC
Bilirubin Urine: NEGATIVE
GLUCOSE, UA: NEGATIVE mg/dL
Hgb urine dipstick: NEGATIVE
KETONES UR: NEGATIVE mg/dL
LEUKOCYTES UA: NEGATIVE
Nitrite: NEGATIVE
PH: 7 (ref 5.0–8.0)
Protein, ur: NEGATIVE mg/dL
Specific Gravity, Urine: 1.006 (ref 1.005–1.030)

## 2016-12-27 LAB — COMPREHENSIVE METABOLIC PANEL
ALT: 52 U/L (ref 14–54)
AST: 35 U/L (ref 15–41)
Albumin: 4.1 g/dL (ref 3.5–5.0)
Alkaline Phosphatase: 54 U/L (ref 38–126)
Anion gap: 7 (ref 5–15)
BILIRUBIN TOTAL: 0.9 mg/dL (ref 0.3–1.2)
BUN: 11 mg/dL (ref 6–20)
CO2: 27 mmol/L (ref 22–32)
Calcium: 8.9 mg/dL (ref 8.9–10.3)
Chloride: 105 mmol/L (ref 101–111)
Creatinine, Ser: 0.77 mg/dL (ref 0.44–1.00)
Glucose, Bld: 118 mg/dL — ABNORMAL HIGH (ref 65–99)
POTASSIUM: 3.3 mmol/L — AB (ref 3.5–5.1)
Sodium: 139 mmol/L (ref 135–145)
TOTAL PROTEIN: 6.8 g/dL (ref 6.5–8.1)

## 2016-12-27 MED ORDER — ONDANSETRON HCL 4 MG/2ML IJ SOLN
4.0000 mg | Freq: Once | INTRAMUSCULAR | Status: AC
  Administered 2016-12-27: 4 mg via INTRAVENOUS
  Filled 2016-12-27: qty 2

## 2016-12-27 MED ORDER — CIPROFLOXACIN HCL 500 MG PO TABS: 500.0000 mg | ORAL_TABLET | Freq: Two times a day (BID) | ORAL | 0 refills | Status: DC

## 2016-12-27 MED ORDER — CIPROFLOXACIN HCL 500 MG PO TABS
500.0000 mg | ORAL_TABLET | Freq: Once | ORAL | Status: AC
  Administered 2016-12-27: 500 mg via ORAL
  Filled 2016-12-27: qty 1

## 2016-12-27 MED ORDER — IOPAMIDOL (ISOVUE-300) INJECTION 61%
100.0000 mL | Freq: Once | INTRAVENOUS | Status: AC | PRN
  Administered 2016-12-27: 100 mL via INTRAVENOUS

## 2016-12-27 MED ORDER — MORPHINE SULFATE (PF) 4 MG/ML IV SOLN
4.0000 mg | Freq: Once | INTRAVENOUS | Status: AC
  Administered 2016-12-27: 4 mg via INTRAVENOUS
  Filled 2016-12-27: qty 1

## 2016-12-27 MED ORDER — SODIUM CHLORIDE 0.9 % IV BOLUS (SEPSIS)
1000.0000 mL | Freq: Once | INTRAVENOUS | Status: AC
  Administered 2016-12-27: 1000 mL via INTRAVENOUS

## 2016-12-27 MED ORDER — METRONIDAZOLE 500 MG PO TABS: 500.0000 mg | ORAL_TABLET | Freq: Three times a day (TID) | ORAL | 0 refills | Status: DC

## 2016-12-27 MED ORDER — METRONIDAZOLE 500 MG PO TABS
500.0000 mg | ORAL_TABLET | Freq: Once | ORAL | Status: AC
  Administered 2016-12-27: 500 mg via ORAL
  Filled 2016-12-27: qty 1

## 2016-12-27 NOTE — ED Triage Notes (Addendum)
Pt presents to ED with c/o RLQ pain for 2 days and was unable to sleep tonight for the pain and now has nausea.

## 2016-12-27 NOTE — ED Provider Notes (Signed)
TIME SEEN: 6:42 AM  CHIEF COMPLAINT: Right lower quadrant pain  HPI: Patient is a 66 year old female with history of hypertension who presents to the emergency department 2 days of right lower quadrant pain that is sharp in nature without radiation. Patient states pain is severe and getting progressively worse. Pain is worse with walking. She now has nausea. No fevers, chills, vomiting. Did have diarrhea yesterday without blood or melena. She is status post cholecystectomy, hysterectomy and she believes she has also had a bilateral oophorectomy. She still has her appendix. No known sick contacts. She denies vaginal bleeding, discharge, dysuria, hematuria. She was concerned that this could be appendicitis.  ROS: See HPI Constitutional: no fever  Eyes: no drainage  ENT: no runny nose   Cardiovascular:  no chest pain  Resp: no SOB  GI: no vomiting GU: no dysuria Integumentary: no rash  Allergy: no hives  Musculoskeletal: no leg swelling  Neurological: no slurred speech ROS otherwise negative  PAST MEDICAL HISTORY/PAST SURGICAL HISTORY:  Past Medical History:  Diagnosis Date  . Allergy   . Hypertension     MEDICATIONS:  Prior to Admission medications   Medication Sig Start Date End Date Taking? Authorizing Provider  ALPHA LIPOIC ACID PO Take 1 tablet by mouth daily.    [provider]  Cholecalciferol (VITAMIN D-3 PO) Take 1,500 Units by mouth daily.    [provider]  doxycycline (VIBRAMYCIN) 100 MG capsule Take 1 capsule (100 mg total) by mouth 2 (two) times daily. 08/12/16   Nafziger, Tommi Rumps, NP  estradiol (VIVELLE-DOT) 0.05 MG/24HR patch Place 1 patch (0.05 mg total) onto the skin every 7 (seven) days. Changes on Wednesday 08/03/16   Marin Olp, MD  fexofenadine (ALLEGRA) 180 MG tablet Take 180 mg by mouth daily.    [provider]  fish oil-omega-3 fatty acids 1000 MG capsule Take 1 g by mouth daily.      [provider]  fluticasone  (FLONASE) 50 MCG/ACT nasal spray Place 2 sprays into both nostrils daily. 08/12/16   Nafziger, Tommi Rumps, NP  glucosamine-chondroitin 500-400 MG tablet Take 1 tablet by mouth daily.      [provider]  HYDROcodone-homatropine (HYCODAN) 5-1.5 MG/5ML syrup Take 5 mLs by mouth every 8 (eight) hours as needed for cough. 08/12/16   Nafziger, Tommi Rumps, NP  levothyroxine (SYNTHROID, LEVOTHROID) 75 MCG tablet TAKE ONE TABLET BY MOUTH ONCE DAILY 06/01/16   Marin Olp, MD  lisinopril-hydrochlorothiazide (PRINZIDE,ZESTORETIC) 20-25 MG tablet Take 1 tablet by mouth daily. 04/14/16   Marin Olp, MD  vitamin B-12 (CYANOCOBALAMIN) 500 MCG tablet Take 500 mcg by mouth daily.    [provider]    ALLERGIES:  Allergies  Allergen Reactions  . Gluten Meal     Has celic  . Wheat Bran     Has celic   . Cephalexin Hives    SOCIAL HISTORY:  Social History  Substance Use Topics  . Smoking status: Former Smoker    Packs/day: 0.15    Years: 25.00    Types: Cigarettes    Quit date: 07/13/2013  . Smokeless tobacco: Not on file  . Alcohol use 0.6 oz/week    1 Standard drinks or equivalent per week    FAMILY HISTORY: Family History  Problem Relation Age of Onset  . Hypertension Mother   . Coronary artery disease Father        75  . COPD Father     EXAM: BP (!) 147/68  Pulse 75   Temp 97.5 F (36.4 C) (Oral)   Resp 18   Ht 5' 5.5" (1.664 m)   Wt 81.6 kg (180 lb)   SpO2 95%   BMI 29.50 kg/m  CONSTITUTIONAL: Alert and oriented and responds appropriately to questions. Well-appearing; well-nourished HEAD: Normocephalic EYES: Conjunctivae clear, pupils appear equal, EOMI ENT: normal nose; moist mucous membranes NECK: Supple, no meningismus, no nuchal rigidity, no LAD  CARD: RRR; S1 and S2 appreciated; no murmurs, no clicks, no rubs, no gallops RESP: Normal chest excursion without splinting or tachypnea; breath sounds clear and equal bilaterally; no wheezes, no rhonchi, no  rales, no hypoxia or respiratory distress, speaking full sentences ABD/GI: Normal bowel sounds; non-distended; soft, Tender to palpation at McBurney's point with intermittent voluntary guarding, no rebound, no hepatosplenomegaly BACK:  The back appears normal and is non-tender to palpation, there is no CVA tenderness EXT: Normal ROM in all joints; non-tender to palpation; no edema; normal capillary refill; no cyanosis, no calf tenderness or swelling    SKIN: Normal color for age and race; warm; no rash NEURO: Moves all extremities equally PSYCH: The patient's mood and manner are appropriate. Grooming and personal hygiene are appropriate.  MEDICAL DECISION MAKING: Patient here with right lower quadrant tenderness. Concern for possible appendicitis. Differential also includes pyelonephritis, kidney stone, UTI, colitis. We'll obtain labs, urine and a CT of her abdomen and pelvis. We'll give IV fluids, pain and nausea medication. We will keep patient NPO.  ED PROGRESS: 7:00 AM  Signed out to Dr. Ralene Bathe to follow up on CT and labs.    I reviewed all nursing notes, vitals, pertinent previous records, EKGs, lab and urine results, imaging (as available).      Peggie Hornak, Delice Bison, DO 12/27/16 [phone]

## 2017-01-04 ENCOUNTER — Ambulatory Visit (INDEPENDENT_AMBULATORY_CARE_PROVIDER_SITE_OTHER): Payer: Medicare HMO | Admitting: Family Medicine

## 2017-01-04 ENCOUNTER — Encounter: Payer: Self-pay | Admitting: Family Medicine

## 2017-01-04 VITALS — BP 128/82 | HR 62 | Temp 97.8°F | Wt 192.8 lb

## 2017-01-04 DIAGNOSIS — R1031 Right lower quadrant pain: Secondary | ICD-10-CM | POA: Diagnosis not present

## 2017-01-04 DIAGNOSIS — K529 Noninfective gastroenteritis and colitis, unspecified: Secondary | ICD-10-CM

## 2017-01-04 DIAGNOSIS — R911 Solitary pulmonary nodule: Secondary | ICD-10-CM

## 2017-01-04 DIAGNOSIS — Z122 Encounter for screening for malignant neoplasm of respiratory organs: Secondary | ICD-10-CM | POA: Diagnosis not present

## 2017-01-04 NOTE — Progress Notes (Signed)
Subjective:    Patient ID: Christine Leon, female    DOB: June 22, 1951, 66 y.o.   MRN: [id number]  HPI  Christine Leon is a 66 year old female who is here to day following an ED visit on 12/27/16. She presented to the ED with RLQ sharp, severe pain that was worsening and worse with walking.  Associated symptoms of nausea without vomiting, fever, or chills noted. She also had diarrhea without blood or melena. Evaluation for possible appendicitis initiated with differential diagnoses of pyelonephritis, kidney stone, UTI, and colitis considered.  CT imaging revealed possible early diverticulitis and incidental findings of 2 peripheral nodules at the right and left lung base measuring less than 6 mm.  They were noted to have a "typical configuration of location for subpleural lymph nodes".  The Fleischner Society guidelines were noted with no specific follow up in a patient with no risk factors for lung cancer development or if risk factors for lung carcinoma present; optional 12 month non contrast chest CT can be considered with referral to to pulmonology. CBC, UA, and CMP were unremarkable.  She was treated for early diverticulitis with cipro and flagyl and advised to follow up with PCP. I am seeing this patient today as her PCP is unavailable. She is a former smoker who quit in 2011 with a pack history of 3.75 years.  Today, she reports feeling well and notes that her right lower quadrant pain has resolved. She took 5 days of Cipro/Flagyl and stated that she felt like she did not need these medications. She reports a history of having "this same pain" when she was dehydrated and she had been working in the heat outside when symptoms were noticed. Also, reports a history of UC since college so she stated that she had "some prednisone" which she took and symptoms completely resolved. Today, she denies fever, chills, sweats, N/V/D,cough, dark or tarry stools, blood in stools, or unexpected weight loss.  Appetite is good and she reports feeling well. She and her husband are here today as they are concerned about follow up for pulmonary nodules.    Review of Systems  Constitutional: Negative for chills, fatigue and fever.  Respiratory: Negative for cough, shortness of breath and wheezing.   Cardiovascular: Negative for chest pain and palpitations.  Gastrointestinal: Negative for abdominal pain, blood in stool, constipation, diarrhea, nausea and vomiting.  Genitourinary: Negative for dysuria, hematuria and urgency.  Musculoskeletal: Negative for myalgias.  Skin: Negative for rash.  Neurological: Negative for dizziness, weakness, light-headedness, numbness and headaches.  Psychiatric/Behavioral:       Denies depressed or anxious mood. She is concerned about recent finding of pulmonary nodules   Past Medical History:  Diagnosis Date  . Allergy   . Hypertension      Social History   Social History  . Marital status: Married    Spouse name: N/A  . Number of children: N/A  . Years of education: N/A   Occupational History  . Not on file.   Social History Main Topics  . Smoking status: Former Smoker    Packs/day: 0.15    Years: 25.00    Types: Cigarettes    Quit date: 07/13/2009  . Smokeless tobacco: Former Systems developer  . Alcohol use 0.6 oz/week    1 Standard drinks or equivalent per week  . Drug use: No  . Sexual activity: Not on file   Other Topics Concern  . Not on file   Social History Narrative  Married (husband patient of Dr. 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 ABDOMINAL HYSTERECTOMY     in 6s. fibroids. including cervix removal. per patient was told no further pap smears after.   Marland Kitchen arthroscopic knee surgery     2015  under Dr. Theda Sers  . CHOLECYSTECTOMY    . COCCYX REMOVAL     after cheerleading accident  . OOPHORECTOMY     in 73s  . TONSILLECTOMY      Family History  Problem Relation Age of Onset  . Hypertension Mother   . Coronary artery disease Father        52  . COPD Father     Allergies  Allergen Reactions  . Gluten Meal     Has celic  . Wheat Bran     Has celic   . Cephalexin Hives    Current Outpatient Prescriptions on File Prior to Visit  Medication Sig Dispense Refill  . ALPHA LIPOIC ACID PO Take 1 tablet by mouth daily.    . Cholecalciferol (VITAMIN D-3 PO) Take 1,500 Units by mouth daily.    Marland Kitchen estradiol (VIVELLE-DOT) 0.05 MG/24HR patch Place 1 patch (0.05 mg total) onto the skin every 7 (seven) days. Changes on Wednesday 12 patch 3  . fexofenadine (ALLEGRA) 180 MG tablet Take 180 mg by mouth daily.    . fish oil-omega-3 fatty acids 1000 MG capsule Take 1 g by mouth daily.      . fluticasone (FLONASE) 50 MCG/ACT nasal spray Place 2 sprays into both nostrils daily. 16 g 6  . glucosamine-chondroitin 500-400 MG tablet Take 1 tablet by mouth daily.      Marland Kitchen levothyroxine (SYNTHROID, LEVOTHROID) 75 MCG tablet TAKE ONE TABLET BY MOUTH ONCE DAILY 90 tablet 1  . lisinopril-hydrochlorothiazide (PRINZIDE,ZESTORETIC) 20-25 MG tablet Take 1 tablet by mouth daily. 90 tablet 3  . vitamin B-12 (CYANOCOBALAMIN) 500 MCG tablet Take 500 mcg by mouth daily.     No current facility-administered medications on file prior to visit.     BP 128/82 (BP Location: Left Arm, Patient Position: Sitting, Cuff Size: Normal)   Pulse 62   Temp 97.8 F (36.6 C) (Oral)   Wt 192 lb 12.8 oz (87.5 kg)   SpO2 98%   BMI 31.60 kg/m        Objective:   Physical Exam  Constitutional: She is oriented to person, place, and time. She appears well-developed and well-nourished.  Eyes: Pupils are equal, round, and reactive to light. No scleral icterus.  Neck: Neck supple.  Cardiovascular: Normal rate, regular  rhythm and intact distal pulses.   Pulmonary/Chest: Effort normal and breath sounds normal. She has no wheezes. She has no rales.  Abdominal: Soft. Bowel sounds are normal. There is no hepatosplenomegaly. There is no tenderness. There is no rigidity, no rebound, no guarding, no CVA tenderness, no tenderness at McBurney's point and negative Murphy's sign.  Musculoskeletal: She exhibits no edema.  Lymphadenopathy:    She has no cervical adenopathy.  Neurological: She is alert and oriented to person, place, and time.  Skin: Skin is warm and dry. No rash noted.  Psychiatric: She has a normal mood and affect. Her behavior is normal.  Judgment and thought content normal.       Assessment & Plan:  1. Pulmonary nodule Nodule sizes are < 90m. Findings of nodules were incidental and noted on an abdominal CT scan as 3 mm nodule at the periphery of RLL and 4 mm nodule at the periphery of the Left lung base. Consulted with supervising physician regarding plan to send for low dose CT scan to screen for lung cancer. He is in agreement with plan due to smoking history, patient age, and findings of more than one nodule will send for low dose CT scan of lungs to screen for lung cancer. Scan is being ordered to evaluate lungs as entire view was not present on abdominal CT.  2. Colitis Improved; Referral to GI will be initiated; reviewed prior PCP note which indicated she is due this year for screening.  3. Right lower quadrant pain Resolved; advised follow up if symptom is noted again. Referral to GI has been initiated.   Reviewed with patient that she is due for a follow up with Dr. HYong Channel She has agreed to schedule this prior to leaving this office today for evaluation and management of chronic conditions.  JDelano Metz FNP-C

## 2017-01-04 NOTE — Patient Instructions (Signed)
It was a pleasure to see you today! A referral for your colonoscopy has been placed and also a referral for a low dose CT scan of your lungs. You will be contact about your referral.  Please let us know if you have not heard back within 1 week about your referral.  You are due for a follow up with Dr. Yong Channel for evaluation and management of your health. Please schedule this as you leave the office today.

## 2017-01-05 NOTE — ED Provider Notes (Signed)
Pt here for evaluation of lower abdominal pain.  Imaging with evidence of colitis.  No evidence of acute appendicitis/obstructing stone.  Sxs improved on recheck in the ED.  Plan to d/c home with outpatient follow up, return precautions.     Quintella Reichert, MD 01/05/17 [phone]

## 2017-01-06 ENCOUNTER — Other Ambulatory Visit: Payer: Self-pay | Admitting: Acute Care

## 2017-01-06 ENCOUNTER — Telehealth: Payer: Self-pay | Admitting: Family Medicine

## 2017-01-06 ENCOUNTER — Other Ambulatory Visit: Payer: Self-pay

## 2017-01-06 DIAGNOSIS — Z87891 Personal history of nicotine dependence: Secondary | ICD-10-CM

## 2017-01-06 DIAGNOSIS — R911 Solitary pulmonary nodule: Secondary | ICD-10-CM

## 2017-01-06 NOTE — Telephone Encounter (Signed)
Aother referral has been placed with the correct Dx.

## 2017-01-06 NOTE — Telephone Encounter (Signed)
Pt's referral for lung cancer screening needs to be changed. to  "ambulatory referral to lung cancer screening" REF [id number]  Pt saw Gregary Signs in Dr Ansel Bong absence and referral was placed through [place]. Thanks!

## 2017-01-18 DIAGNOSIS — K900 Celiac disease: Secondary | ICD-10-CM | POA: Diagnosis not present

## 2017-01-18 DIAGNOSIS — Z0000 Encounter for general adult medical examination without abnormal findings: Secondary | ICD-10-CM | POA: Diagnosis not present

## 2017-01-18 DIAGNOSIS — J309 Allergic rhinitis, unspecified: Secondary | ICD-10-CM | POA: Diagnosis not present

## 2017-01-18 DIAGNOSIS — I10 Essential (primary) hypertension: Secondary | ICD-10-CM | POA: Diagnosis not present

## 2017-01-18 DIAGNOSIS — E039 Hypothyroidism, unspecified: Secondary | ICD-10-CM | POA: Diagnosis not present

## 2017-01-18 DIAGNOSIS — E669 Obesity, unspecified: Secondary | ICD-10-CM | POA: Diagnosis not present

## 2017-01-18 DIAGNOSIS — M25569 Pain in unspecified knee: Secondary | ICD-10-CM | POA: Diagnosis not present

## 2017-01-18 DIAGNOSIS — R0600 Dyspnea, unspecified: Secondary | ICD-10-CM | POA: Diagnosis not present

## 2017-01-18 DIAGNOSIS — Z79890 Hormone replacement therapy (postmenopausal): Secondary | ICD-10-CM | POA: Diagnosis not present

## 2017-01-18 DIAGNOSIS — Z6832 Body mass index (BMI) 32.0-32.9, adult: Secondary | ICD-10-CM | POA: Diagnosis not present

## 2017-01-20 ENCOUNTER — Encounter: Payer: Self-pay | Admitting: Acute Care

## 2017-01-20 ENCOUNTER — Ambulatory Visit (INDEPENDENT_AMBULATORY_CARE_PROVIDER_SITE_OTHER): Payer: Medicare HMO | Admitting: Acute Care

## 2017-01-20 ENCOUNTER — Ambulatory Visit (INDEPENDENT_AMBULATORY_CARE_PROVIDER_SITE_OTHER)
Admission: RE | Admit: 2017-01-20 | Discharge: 2017-01-20 | Disposition: A | Discharge status: Home / Self Care | Payer: Medicare HMO | Source: Ambulatory Visit | Attending: Acute Care | Admitting: Acute Care

## 2017-01-20 DIAGNOSIS — Z87891 Personal history of nicotine dependence: Secondary | ICD-10-CM

## 2017-01-20 NOTE — Progress Notes (Signed)
Shared Decision Making Visit Lung Cancer Screening Program [phone])   Eligibility:  Age 66 y.o.  Pack Years Smoking History Calculation 39 pack years smoking history (# packs/per year x # years smoked)  Recent History of coughing up blood  no  Unexplained weight loss? no ( >Than 15 pounds within the last 6 months )  Prior History Lung / other cancer no (Diagnosis within the last 5 years already requiring surveillance chest CT Scans).  Smoking Status Former Smoker  Former Smokers: Years since quit: 8 years  Quit Date: 2008  Visit Components:  Discussion included one or more decision making aids. yes  Discussion included risk/benefits of screening. yes  Discussion included potential follow up diagnostic testing for abnormal scans. yes  Discussion included meaning and risk of over diagnosis. yes  Discussion included meaning and risk of False Positives. yes  Discussion included meaning of total radiation exposure. yes  Counseling Included:  Importance of adherence to annual lung cancer LDCT screening. yes  Impact of comorbidities on ability to participate in the program. yes  Ability and willingness to under diagnostic treatment. yes  Smoking Cessation Counseling:  Current Smokers:   Discussed importance of smoking cessation. yes  Information about tobacco cessation classes and interventions provided to patient. yes  Patient provided with "ticket" for LDCT Scan. yes  Symptomatic Patient. no  Counseling  Diagnosis Code: Tobacco Use Z72.0  Asymptomatic Patient yes  Counseling (Intermediate counseling: > three minutes counseling) Z3299  Former Smokers:   Discussed the importance of maintaining cigarette abstinence. yes  Diagnosis Code: Personal History of Nicotine Dependence. M42.683  Information about tobacco cessation classes and interventions provided to patient. Yes  Patient provided with "ticket" for LDCT Scan. yes  Written Order for Lung Cancer  Screening with LDCT placed in Epic. Yes (CT Chest Lung Cancer Screening Low Dose W/O CM) [id number] Z12.2-Screening of respiratory organs Z87.891-Personal history of nicotine dependence  I spent 25 minutes of face to face time with Cephus Richer and her husband discussing the risks and benefits of lung cancer screening. We viewed a power point together that explained in detail the above noted topics. We took the time to pause the power point at intervals to allow for questions to be asked and answered to ensure understanding. We discussed that she had taken the single most powerful action possible to decrease her risk of developing lung cancer when she quit smoking. I counseled her to remain smoke free, and to contact me if she ever had the desire to smoke again so that I can provide resources and tools to help support the effort to remain smoke free. We discussed the time and location of the scan, and that either  Doroteo Glassman RN or I will call with the results within  24-48 hours of receiving them. Mrs. Cleverly has my card and contact information in the event she needs to speak with me, in addition to a copy of the power point we reviewed as a resource. Mrs. Mccollister verbalized understanding of all of the above and had no further questions upon leaving the office.     I explained to the patient that there has been a high incidence of coronary artery disease noted on these exams. I explained that this is a non-gated exam therefore degree or severity cannot be determined. This patient is not currently on statin therapy. I have asked the patient to follow-up with their PCP regarding any incidental finding of coronary artery disease and management with diet or  medication as they feel is clinically indicated. The patient verbalized understanding of the above and had no further questions.     Magdalen Spatz, NP 01/20/2017

## 2017-01-26 ENCOUNTER — Telehealth: Payer: Self-pay | Admitting: Acute Care

## 2017-01-26 DIAGNOSIS — Z87891 Personal history of nicotine dependence: Secondary | ICD-10-CM

## 2017-01-26 NOTE — Telephone Encounter (Signed)
Please call patient and let her know her scan was read as a lung RADS 2.Lung RADS 2: nodules that are benign in appearance and behavior with a very low likelihood of becoming a clinically active cancer due to size or lack of growth. Recommendation per radiology is for a repeat LDCT in 12 months. W we will order and schedule her annual scan for July 2019. Please let her know that there was notation of some atherosclerosis. This patient is not on statin therapy per Epic. Please remind the patient  that this is a non-gated exam therefore degree or severity of disease  cannot be determined. Please have him follow up with his PCP regarding potential risk factor modification, dietary therapy or pharmacologic therapy if clinically indicated. Please place order for annual screening scan for July 2019, and fax results to PCP. Thank you so much

## 2017-01-26 NOTE — Telephone Encounter (Signed)
Pt informed of CT results per Sarah Groce, NP.  PT verbalized understanding.  Copy sent to PCP.  Order placed for 1 yr f/u CT.  

## 2017-01-26 NOTE — Telephone Encounter (Signed)
Judson Roch can you please advise on CT results?  Thanks

## 2017-02-02 DIAGNOSIS — S2096XA Insect bite (nonvenomous) of unspecified parts of thorax, initial encounter: Secondary | ICD-10-CM | POA: Diagnosis not present

## 2017-02-08 ENCOUNTER — Other Ambulatory Visit: Payer: Self-pay | Admitting: Acute Care

## 2017-03-04 ENCOUNTER — Encounter: Payer: Self-pay | Admitting: Gastroenterology

## 2017-03-04 ENCOUNTER — Ambulatory Visit (INDEPENDENT_AMBULATORY_CARE_PROVIDER_SITE_OTHER): Payer: Medicare HMO | Admitting: Gastroenterology

## 2017-03-04 ENCOUNTER — Other Ambulatory Visit (INDEPENDENT_AMBULATORY_CARE_PROVIDER_SITE_OTHER): Payer: Medicare HMO

## 2017-03-04 ENCOUNTER — Encounter (INDEPENDENT_AMBULATORY_CARE_PROVIDER_SITE_OTHER): Payer: Self-pay

## 2017-03-04 VITALS — BP 130/80 | HR 78 | Ht 65.0 in | Wt 194.2 lb

## 2017-03-04 DIAGNOSIS — R935 Abnormal findings on diagnostic imaging of other abdominal regions, including retroperitoneum: Secondary | ICD-10-CM

## 2017-03-04 DIAGNOSIS — K900 Celiac disease: Secondary | ICD-10-CM | POA: Diagnosis not present

## 2017-03-04 DIAGNOSIS — Z1211 Encounter for screening for malignant neoplasm of colon: Secondary | ICD-10-CM | POA: Diagnosis not present

## 2017-03-04 LAB — IGA: IGA: 145 mg/dL (ref 68–378)

## 2017-03-04 LAB — VITAMIN D 25 HYDROXY (VIT D DEFICIENCY, FRACTURES): VITD: 43.11 ng/mL (ref 30.00–100.00)

## 2017-03-04 MED ORDER — SUPREP BOWEL PREP KIT 17.5-3.13-1.6 GM/177ML PO SOLN: ORAL | 0 refills | Status: DC

## 2017-03-04 NOTE — Progress Notes (Signed)
HPI :  66 y/o female with a reported history of celiac diseases, HTN, hypothyroidism, and possible ischemic colitis, here for a new patient visiti, formerly seen by Dr. Earlean Shawl, here to discuss having a colonoscopy and history of celiac.   She is reported to have a history of celiac disease - diagnosed 6-7 years ago. She states she had "blood test done" but no prior upper endoscopy.   Last colonoscopy done in 2013 - she reports being dehydrated prior to her last colonoscopy and presented with abdominal pain, told she had ischemic colitis. Biopsies taken but no path available today. Prep noted to be poor.    She had a CT Scan on 12/27/16 for acute onset of lower right abdominal pain, no associated diarrhea at the time. She had some nonspecific inflammatory changes in the right colon. Normal appendix. DDx per included infectious colitis, versus diverticulitis versus ischemic. She has not had any recurrence of her pain since that time and generally has been feeling well. She does not have any blood in her stools which bother her. She has roughly 3-4 BMs per day - she states stools are normal form. She tries to maintain a gluten free diet, although she thinks she may have some gluten at times. She denies much bloating / gas. No weight loss. She has gained some weight recently.   No FH of colon cancer. She is taking vitamin D supplement at 1500 IU / day. No DEXA scan in recent years.    Colonoscopy 01/07/2012 - diverticulosis, hemorrhoids, suspected left sided ischemic colitis - unable to evaluate the right colon due to poor prep. Biopsies taken but path not available. Colonoscopy 02/12/2011 - diverticulosis, hemorrhoids - report does not state where diverticular disease is located    Past Medical History:  Diagnosis Date  . Allergy   . Celiac sprue   . Hypertension   . Hypothyroidism      Past Surgical History:  Procedure Laterality Date  . ABDOMINAL HYSTERECTOMY     in 32s. fibroids.  including cervix removal. per patient was told no further pap smears after.   Marland Kitchen arthroscopic knee surgery     2015 under Dr. Theda Sers  . CHOLECYSTECTOMY    . COCCYX REMOVAL     after cheerleading accident  . OOPHORECTOMY     in 63s  . TONSILLECTOMY     Family History  Problem Relation Age of Onset  . Hypertension Mother   . Coronary artery disease Father        45  . COPD Father    Social History  Substance Use Topics  . Smoking status: Former Smoker    Packs/day: 1.00    Years: 39.00    Types: Cigarettes    Quit date: 07/13/2006  . Smokeless tobacco: Former Systems developer  . Alcohol use 0.6 oz/week    1 Standard drinks or equivalent per week   Current Outpatient Prescriptions  Medication Sig Dispense Refill  . ALPHA LIPOIC ACID PO Take 1 tablet by mouth daily.    . Cholecalciferol (VITAMIN D-3 PO) Take 1,500 Units by mouth daily.    Marland Kitchen estradiol (VIVELLE-DOT) 0.05 MG/24HR patch Place 1 patch (0.05 mg total) onto the skin every 7 (seven) days. Changes on Wednesday 12 patch 3  . fexofenadine (ALLEGRA) 180 MG tablet Take 180 mg by mouth daily.    . fish oil-omega-3 fatty acids 1000 MG capsule Take 1 g by mouth daily.      . fluticasone (FLONASE) 50 MCG/ACT  nasal spray Place 2 sprays into both nostrils daily. 16 g 6  . glucosamine-chondroitin 500-400 MG tablet Take 1 tablet by mouth daily.      Marland Kitchen levothyroxine (SYNTHROID, LEVOTHROID) 75 MCG tablet TAKE ONE TABLET BY MOUTH ONCE DAILY 90 tablet 1  . lisinopril-hydrochlorothiazide (PRINZIDE,ZESTORETIC) 20-25 MG tablet Take 1 tablet by mouth daily. 90 tablet 3  . vitamin B-12 (CYANOCOBALAMIN) 500 MCG tablet Take 500 mcg by mouth daily.     No current facility-administered medications for this visit.    Allergies  Allergen Reactions  . Gluten Meal     Has celic  . Wheat Bran     Has celic   . Cephalexin Hives     Review of Systems: All systems reviewed and negative except where noted in HPI.   Lab Results  Component Value Date    WBC 6.8 12/27/2016   HGB 13.4 12/27/2016   HCT 38.4 12/27/2016   MCV 88.3 12/27/2016   PLT 234 12/27/2016    Lab Results  Component Value Date   CREATININE 0.77 12/27/2016   BUN 11 12/27/2016   NA 139 12/27/2016   K 3.3 (L) 12/27/2016   CL 105 12/27/2016   CO2 27 12/27/2016    Lab Results  Component Value Date   ALT 52 12/27/2016   AST 35 12/27/2016   ALKPHOS 54 12/27/2016   BILITOT 0.9 12/27/2016     Physical Exam: BP 130/80 (BP Location: Left Arm, Patient Position: Sitting)   Pulse 78   Ht 5' 5"  (1.651 m)   Wt 194 lb 3.2 oz (88.1 kg)   SpO2 98%   BMI 32.32 kg/m  Constitutional: Pleasant,well-developed, female in no acute distress. HEENT: Normocephalic and atraumatic. Conjunctivae are normal. No scleral icterus. Neck supple.  Cardiovascular: Normal rate, regular rhythm.  Pulmonary/chest: Effort normal and breath sounds normal. No wheezing, rales or rhonchi. Abdominal: Soft, nondistended, nontender.  There are no masses palpable. No hepatomegaly. Extremities: no edema Lymphadenopathy: No cervical adenopathy noted. Neurological: Alert and oriented to person place and time. Skin: Skin is warm and dry. No rashes noted. Psychiatric: Normal mood and affect. Behavior is normal.   ASSESSMENT AND PLAN: 66 year old female here for new patient visit to discuss the following issues:  Abnormal CT scan abdomen / colon cancer screening - presented in June with acute abdominal pain, right-sided colitis noted on CT scan versus diverticulitis. Her prior colonoscopies do not clarify where diverticuli are located. Her pain has since resolved. She interestingly has a suspected history of ischemic colitis in 2013 of the left colon. We'll obtain pathology results from that exam to clarify. Recommend colonoscopy at this time given her recent CT scan findings and for screening purposes. I discussed risks benefits colonoscopy with her and she wanted to proceed. Further recommendations  pending the results.  Reported history of celiac disease - she reports this diagnosis was made several years ago based on serology only. She denies ever having a prior EGD. I discussed celiac disease with her in general, and importance of being on a strict gluten-free diet if she does indeed have this. I'm recommending an upper endoscopy with biopsies she's never had one as well as repeating TTG. We'll also send vitamin D level to ensure adequate screen her for osteopenia with a DEXA scan as its been several years since her last exam. She agreed the plan, further recommendations pending the results.   Peshtigo Cellar, MD [facility] Pager [phone]  CC: Delano Metz, [place]

## 2017-03-04 NOTE — Patient Instructions (Addendum)
You have been scheduled for a bone density test on 03-10-17 at 9:30am. Please arrive 15 minutes prior to your scheduled appointment to radiology on the basement floor of [place] location for this test. If you need to cancel or reschedule for any reason, please contact radiology at [phone].  Preparation for test is as follows:   If you are taking calcium, discontinue this 24-48 hours prior to your appointment.   Wear pants with an elastic waistband (or without any metal such as a zipper).   Do not wear an underwire bra.   We do have gowns if you are unable to find appropriate clothing without metal.   Please bring a list of all current medications.   Your physician has requested that you go to the basement for lab work before leaving today.  You have been scheduled for an endoscopy and colonoscopy. Please follow the written instructions given to you at your visit today. Please pick up your prep supplies at the pharmacy within the next 1-3 days. If you use inhalers (even only as needed), please bring them with you on the day of your procedure. Your physician has requested that you go to www.startemmi.com and enter the access code given to you at your visit today. This web site gives a general overview about your procedure. However, you should still follow specific instructions given to you by our office regarding your preparation for the procedure.  If you are age 71 or older, your body mass index should be between 23-30. Your Body mass index is 32.32 kg/m. If this is out of the aforementioned range listed, please consider follow up with your Primary Care Provider.  If you are age 52 or younger, your body mass index should be between 19-25. Your Body mass index is 32.32 kg/m. If this is out of the aformentioned range listed, please consider follow up with your Primary Care Provider.

## 2017-03-08 LAB — TISSUE TRANSGLUTAMINASE ABS,IGG,IGA
TISSUE TRANSGLUT AB: 7 U/mL — AB (ref <6)
Tissue Transglutaminase Ab, IgA: 8 U/mL — ABNORMAL HIGH (ref <4)

## 2017-03-10 ENCOUNTER — Encounter: Payer: Self-pay | Admitting: Gastroenterology

## 2017-03-10 ENCOUNTER — Other Ambulatory Visit: Payer: Medicare HMO

## 2017-03-10 ENCOUNTER — Telehealth: Payer: Self-pay | Admitting: Gastroenterology

## 2017-03-10 NOTE — Telephone Encounter (Signed)
Received records from Dr. Liliane Channel office regarding prior endoscopies.  She did have a prior EGD on 12/26/2007, which showed duodenal changes on biopsies c/w celiac disease.   She had a prior colonoscopy suggesting ischemic colitis of the left colon as outlined in clinic note, but no biopsies taken.   We will await the results of her scheduled endoscopies.

## 2017-04-13 ENCOUNTER — Telehealth: Payer: Self-pay

## 2017-04-13 NOTE — Telephone Encounter (Signed)
Called patient as we received a notice from [facility] stating patient may not be taking her Lisinopril/HCTZ. I spoke to her and she confirmed that she is taking it every day. She said at her last refill she called it in while on vacation and didn't pick it up until 3 days later. She states she also received a call from [place]

## 2017-04-15 ENCOUNTER — Ambulatory Visit (INDEPENDENT_AMBULATORY_CARE_PROVIDER_SITE_OTHER): Payer: Medicare HMO | Admitting: Family Medicine

## 2017-04-15 ENCOUNTER — Encounter: Payer: Self-pay | Admitting: Family Medicine

## 2017-04-15 VITALS — BP 136/84 | HR 73 | Temp 98.4°F | Ht 65.0 in | Wt 193.6 lb

## 2017-04-15 DIAGNOSIS — I10 Essential (primary) hypertension: Secondary | ICD-10-CM | POA: Diagnosis not present

## 2017-04-15 DIAGNOSIS — I700 Atherosclerosis of aorta: Secondary | ICD-10-CM | POA: Diagnosis not present

## 2017-04-15 DIAGNOSIS — Z780 Asymptomatic menopausal state: Secondary | ICD-10-CM

## 2017-04-15 DIAGNOSIS — Z0000 Encounter for general adult medical examination without abnormal findings: Secondary | ICD-10-CM

## 2017-04-15 DIAGNOSIS — K900 Celiac disease: Secondary | ICD-10-CM | POA: Diagnosis not present

## 2017-04-15 DIAGNOSIS — Z7189 Other specified counseling: Secondary | ICD-10-CM | POA: Diagnosis not present

## 2017-04-15 DIAGNOSIS — Z23 Encounter for immunization: Secondary | ICD-10-CM

## 2017-04-15 DIAGNOSIS — E034 Atrophy of thyroid (acquired): Secondary | ICD-10-CM

## 2017-04-15 LAB — LIPID PANEL
CHOL/HDL RATIO: 4
CHOLESTEROL: 160 mg/dL (ref 0–200)
HDL: 43.1 mg/dL (ref >39.00)
LDL Cholesterol: 93 mg/dL (ref 0–99)
NONHDL: 116.6
Triglycerides: 118 mg/dL (ref 0.0–149.0)
VLDL: 23.6 mg/dL (ref 0.0–40.0)

## 2017-04-15 LAB — POC URINALSYSI DIPSTICK (AUTOMATED)
BILIRUBIN UA: NEGATIVE
Blood, UA: NEGATIVE
GLUCOSE UA: NEGATIVE
KETONES UA: NEGATIVE
Nitrite, UA: NEGATIVE
PH UA: 6 (ref 5.0–8.0)
Protein, UA: NEGATIVE
Spec Grav, UA: 1.025 (ref 1.010–1.025)
Urobilinogen, UA: 0.2 E.U./dL

## 2017-04-15 LAB — CBC
HEMATOCRIT: 44.3 % (ref 36.0–46.0)
Hemoglobin: 14.7 g/dL (ref 12.0–15.0)
MCHC: 33.2 g/dL (ref 30.0–36.0)
MCV: 91.2 fl (ref 78.0–100.0)
Platelets: 271 10*3/uL (ref 150.0–400.0)
RBC: 4.86 Mil/uL (ref 3.87–5.11)
RDW: 14.1 % (ref 11.5–15.5)
WBC: 4.5 10*3/uL (ref 4.0–10.5)

## 2017-04-15 LAB — COMPREHENSIVE METABOLIC PANEL
ALBUMIN: 4.6 g/dL (ref 3.5–5.2)
ALT: 55 U/L — AB (ref 0–35)
AST: 32 U/L (ref 0–37)
Alkaline Phosphatase: 60 U/L (ref 39–117)
BILIRUBIN TOTAL: 0.8 mg/dL (ref 0.2–1.2)
BUN: 10 mg/dL (ref 6–23)
CO2: 26 mEq/L (ref 19–32)
CREATININE: 0.79 mg/dL (ref 0.40–1.20)
Calcium: 9.7 mg/dL (ref 8.4–10.5)
Chloride: 104 mEq/L (ref 96–112)
GFR: 77.18 mL/min (ref >60.00)
GLUCOSE: 84 mg/dL (ref 70–99)
POTASSIUM: 3.8 meq/L (ref 3.5–5.1)
SODIUM: 140 meq/L (ref 135–145)
TOTAL PROTEIN: 7.3 g/dL (ref 6.0–8.3)

## 2017-04-15 NOTE — Assessment & Plan Note (Addendum)
Hypothyroidism- Last tsh slightly off- levothyroxine now at 88 mcg. Managed by Dr. Chalmers Cater- declines labs

## 2017-04-15 NOTE — Patient Instructions (Addendum)
Schedule your bone density test at check out desk  We could consider being more aggressive. Your bad cholesterol was 84- with plaque could target under 70 but I do not really think benefits outweigh risks unless progression noted or if LDL/bad cholesterol gets above 100.

## 2017-04-15 NOTE — Assessment & Plan Note (Signed)
Discussed atherosclerosis and managing risk factors. LDL 84 last check- likely wouldn't start statin unless LDL over 100

## 2017-04-15 NOTE — Assessment & Plan Note (Signed)
HTN- controlled on lisinopril-hctz 20-96m

## 2017-04-15 NOTE — Assessment & Plan Note (Signed)
Estrogen replacement- intense hot flashes off estradiol. Discussed risks particularly cancer and DVT/PE risks. Stretches the patch as long as she can until she gets hot flashes. has to get mammogram.

## 2017-04-15 NOTE — Progress Notes (Signed)
Phone: [phone]  Subjective:  Patient presents today for their annual physical. Chief complaint-noted.   See problem oriented charting- ROS- full  review of systems was completed and negative except for: occasional chest pain nonexertional- has seen cardiology in the past. No shortness of breath with it. Mild sweating at times. Slight lightheadedness.   The following were reviewed and entered/updated in epic: Past Medical History:  Diagnosis Date  . Allergy   . Celiac sprue   . Hypertension   . Hypothyroidism    Patient Active Problem List   Diagnosis Date Noted  . Counseling for estrogen replacement therapy 08/15/2014    Priority: Medium  . Obesity 08/15/2014    Priority: Medium  . Hypothyroidism 07/19/2014    Priority: Medium  . CELIAC SPRUE 04/20/2008    Priority: Medium  . Essential hypertension 06/05/2007    Priority: Medium  . Retinal tear 08/15/2014    Priority: Low  . Left knee pain 07/25/2014    Priority: Low  . Allergic rhinitis 07/20/2007    Priority: Low  . Aortic atherosclerosis ([place]) 04/15/2017   Past Surgical History:  Procedure Laterality Date  . ABDOMINAL HYSTERECTOMY     in 90s. fibroids. including cervix removal. per patient was told no further pap smears after.   Marland Kitchen arthroscopic knee surgery     2015 under Dr. Theda Sers  . CHOLECYSTECTOMY    . COCCYX REMOVAL     after cheerleading accident  . OOPHORECTOMY     in 57s  . TONSILLECTOMY      Family History  Problem Relation Age of Onset  . Hypertension Mother   . Coronary artery disease Father        26  . COPD Father        passed age 5    Medications- reviewed and updated Current Outpatient Prescriptions  Medication Sig Dispense Refill  . ALPHA LIPOIC ACID PO Take 1 tablet by mouth daily.    . Cholecalciferol (VITAMIN D-3 PO) Take 1,500 Units by mouth daily.    Marland Kitchen estradiol (VIVELLE-DOT) 0.05 MG/24HR patch Place 1 patch (0.05 mg total) onto the skin every 7 (seven) days. Changes on  Wednesday 12 patch 3  . fexofenadine (ALLEGRA) 180 MG tablet Take 180 mg by mouth daily.    . fish oil-omega-3 fatty acids 1000 MG capsule Take 1 g by mouth daily.      . fluticasone (FLONASE) 50 MCG/ACT nasal spray Place 2 sprays into both nostrils daily. 16 g 6  . glucosamine-chondroitin 500-400 MG tablet Take 1 tablet by mouth daily.      Marland Kitchen levothyroxine (SYNTHROID, LEVOTHROID) 88 MCG tablet Take 88 mcg by mouth daily.    Marland Kitchen lisinopril-hydrochlorothiazide (PRINZIDE,ZESTORETIC) 20-25 MG tablet Take 1 tablet by mouth daily. 90 tablet 3  . SUPREP BOWEL PREP KIT 17.5-3.13-1.6 GM/180ML SOLN Suprep-Use as directed 354 mL 0  . vitamin B-12 (CYANOCOBALAMIN) 500 MCG tablet Take 500 mcg by mouth daily.     Allergies-reviewed and updated Allergies  Allergen Reactions  . Gluten Meal     Has celic  . Wheat Bran     Has celic   . Cephalexin Hives    Social History   Social History  . Marital status: Married    Spouse name: N/A  . Number of children: N/A  . Years of education: N/A   Social History Main Topics  . Smoking status: Former Smoker    Packs/day: 1.00    Years: 39.00    Types: Cigarettes  Quit date: 07/13/2006  . Smokeless tobacco: Former Systems developer  . Alcohol use 0.6 oz/week    1 Standard drinks or equivalent per week  . Drug use: No  . Sexual activity: Not Asked   Other Topics Concern  . None   Social History Narrative   Married (husband patient of Dr. 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  Oropharynx normal Neck: no thyromegaly CV: RRR no murmurs rubs or gallops Lungs: CTAB no crackles, wheeze, rhonchi Abdomen: soft/nontender/nondistended/normal bowel sounds. No rebound or guarding.  Ext: no edema Skin: warm, dry Neuro: grossly normal, moves all extremities, PERRLA  Assessment/Plan:  66 y.o. female presenting for annual physical.  Health Maintenance counseling: 1. Anticipatory guidance: Patient counseled regarding regular dental exams q6 months, eye exams yearly, wearing seatbelts.  2. Risk factor reduction:  Advised patient of need for regular exercise and diet rich and fruits and vegetables to reduce risk of heart attack and stroke. Exercise- lacking due to stress caring for grandkids/niece/husband/mother. Diet-. Weight stable from last year. Goal had been 10 lbs down- had a lot of barriers come up  3. Immunizations/screenings/ancillary studies- declines flu shot. Discussed pneumovax 23 today- given Immunization History  Administered Date(s) Administered  . Pneumococcal Conjugate-13 04/14/2016  . Td 11/07/2008  . Tdap 11/24/2013  . Zoster 06/02/2013  4. Cervical cancer screening-  hysterectomy including cervix due to fibroid uterus- no pap smear indicated 5. Breast cancer screening-  breast exam declined - also declines issues on self exam- and mammogram advised- last noted 2015- give breast exam handout 6. Colon cancer screening - 01/07/12 with 5 year follow up due to history of ulcerative colitis in college which has not been active fortunately- has visit scheduled with Dr. Havery Moros 7. Skin cancer screening-  advised regular sunscreen use. Denies worrisome, changing, or new skin lesions.  8. Osteporosis screening- recommended getting bone density . Ordered today  Status of chronic or acute concerns   Counseling for estrogen replacement therapy Estrogen replacement- intense hot flashes off estradiol. Discussed risks particularly cancer and DVT/PE risks. Stretches the  patch as long as she can until she gets hot flashes. has to get mammogram.   Essential hypertension HTN- controlled on lisinopril-hctz 20-49m  Hypothyroidism Hypothyroidism- Last tsh slightly off- levothyroxine now at 88 mcg. Managed by Dr. BChalmers Cater declines labs  CELIAC SPRUE Celiac disease- remains gluten free as best she can.   Aortic atherosclerosis ([facility]) Discussed atherosclerosis and managing risk factors. LDL 84 last check- likely wouldn't start statin unless LDL over 100  Future Appointments Date Time Provider [place] 05/04/2017 8:00 AM Arm[place], SCarlota Raspberry MD [facility]   Return in about 1 year (around 04/15/2018) for physical.  Orders Placed This Encounter  Procedures  . DG Bone Density    Standing Status:   Future    Standing Expiration Date:   06/15/2018    Order Specific Question:  Reason for Exam (SYMPTOM  OR DIAGNOSIS REQUIRED)    Answer:   postmenopausal screening    Order Specific Question:   Preferred imaging location?    Answer:   [facility]  . CBC    [facility] [facility]  . Comprehensive metabolic panel    [facility]    Order Specific Question:   Has the patient fasted?    Answer:   No  . Lipid panel    [place]    Order Specific Question:   Has the patient fasted?    Answer:   No  . POCT Urinalysis Dipstick (Automated)    Meds ordered this encounter  Medications  . levothyroxine (SYNTHROID, LEVOTHROID) 88 MCG tablet    Sig: Take 88 mcg by mouth daily.    Return precautions advised.  Garret Reddish, MD

## 2017-04-15 NOTE — Progress Notes (Signed)
Your CBC was normal (blood counts, infection fighting cells, platelets). Your CMET was largely normal (kidney, liver, and electrolytes, blood sugar). One of your liver tests was a hair high- weight loss and minimizing alcohol intake can often help with this.  Your cholesterol looked great with bad cholesterol under 100 at 93 and all other #s at goal.

## 2017-04-15 NOTE — Assessment & Plan Note (Signed)
Celiac disease- remains gluten free as best she can.

## 2017-04-21 ENCOUNTER — Encounter: Payer: Self-pay | Admitting: Gastroenterology

## 2017-05-03 ENCOUNTER — Telehealth: Payer: Self-pay | Admitting: Gastroenterology

## 2017-05-03 NOTE — Telephone Encounter (Signed)
Okay thanks for letting me know, she can reschedule at her convenience

## 2017-05-03 NOTE — Telephone Encounter (Signed)
Christine Leon rescheduled for 05-27-17.

## 2017-05-04 ENCOUNTER — Encounter: Payer: Medicare HMO | Admitting: Gastroenterology

## 2017-05-27 ENCOUNTER — Other Ambulatory Visit: Payer: Self-pay

## 2017-05-27 ENCOUNTER — Ambulatory Visit (AMBULATORY_SURGERY_CENTER): Payer: Medicare HMO | Admitting: Gastroenterology

## 2017-05-27 ENCOUNTER — Encounter: Payer: Self-pay | Admitting: Gastroenterology

## 2017-05-27 VITALS — BP 123/55 | HR 69 | Temp 98.9°F | Resp 20 | Ht 65.0 in | Wt 194.0 lb

## 2017-05-27 DIAGNOSIS — K6389 Other specified diseases of intestine: Secondary | ICD-10-CM | POA: Diagnosis not present

## 2017-05-27 DIAGNOSIS — K900 Celiac disease: Secondary | ICD-10-CM | POA: Diagnosis not present

## 2017-05-27 DIAGNOSIS — R935 Abnormal findings on diagnostic imaging of other abdominal regions, including retroperitoneum: Secondary | ICD-10-CM

## 2017-05-27 DIAGNOSIS — D123 Benign neoplasm of transverse colon: Secondary | ICD-10-CM

## 2017-05-27 DIAGNOSIS — D125 Benign neoplasm of sigmoid colon: Secondary | ICD-10-CM

## 2017-05-27 DIAGNOSIS — K635 Polyp of colon: Secondary | ICD-10-CM

## 2017-05-27 DIAGNOSIS — Z1211 Encounter for screening for malignant neoplasm of colon: Secondary | ICD-10-CM | POA: Diagnosis not present

## 2017-05-27 DIAGNOSIS — I10 Essential (primary) hypertension: Secondary | ICD-10-CM | POA: Diagnosis not present

## 2017-05-27 MED ORDER — SODIUM CHLORIDE 0.9 % IV SOLN: 500.0000 mL | INTRAVENOUS | Status: DC

## 2017-05-27 NOTE — Progress Notes (Signed)
A/ox3, pleased with MAC, report to Michele RN 

## 2017-05-27 NOTE — Progress Notes (Signed)
Called to room to assist during endoscopic procedure.  Patient ID and intended procedure confirmed with present staff. Received instructions for my participation in the procedure from the performing physician.  

## 2017-05-27 NOTE — Patient Instructions (Signed)
YOU HAD AN ENDOSCOPIC PROCEDURE TODAY AT [facility]:   Refer to the procedure report that was given to you for any specific questions about what was found during the examination.  If the procedure report does not answer your questions, please call your gastroenterologist to clarify.  If you requested that your care partner not be given the details of your procedure findings, then the procedure report has been included in a sealed envelope for you to review at your convenience later.  YOU SHOULD EXPECT: Some feelings of bloating in the abdomen. Passage of more gas than usual.  Walking can help get rid of the air that was put into your GI tract during the procedure and reduce the bloating. If you had a lower endoscopy (such as a colonoscopy or flexible sigmoidoscopy) you may notice spotting of blood in your stool or on the toilet paper. If you underwent a bowel prep for your procedure, you may not have a normal bowel movement for a few days.  Please Note:  You might notice some irritation and congestion in your nose or some drainage.  This is from the oxygen used during your procedure.  There is no need for concern and it should clear up in a day or so.  SYMPTOMS TO REPORT IMMEDIATELY:   Following lower endoscopy (colonoscopy or flexible sigmoidoscopy):  Excessive amounts of blood in the stool  Significant tenderness or worsening of abdominal pains  Swelling of the abdomen that is new, acute  Fever of 100F or higher   Following upper endoscopy (EGD)  Vomiting of blood or coffee ground material  New chest pain or pain under the shoulder blades  Painful or persistently difficult swallowing  New shortness of breath  Fever of 100F or higher  Black, tarry-looking stools  For urgent or emergent issues, a gastroenterologist can be reached at any hour by calling [phone].   DIET:  We do recommend a small meal at first, but then you may proceed to your regular diet.  Drink  plenty of fluids but you should avoid alcoholic beverages for 24 hours.  ACTIVITY:  You should plan to take it easy for the rest of today and you should NOT DRIVE or use heavy machinery until tomorrow (because of the sedation medicines used during the test).    FOLLOW UP: Our staff will call the number listed on your records the next business day following your procedure to check on you and address any questions or concerns that you may have regarding the information given to you following your procedure. If we do not reach you, we will leave a message.  However, if you are feeling well and you are not experiencing any problems, there is no need to return our call.  We will assume that you have returned to your regular daily activities without incident.  If any biopsies were taken you will be contacted by phone or by letter within the next 1-3 weeks.  Please call us at [phone] if you have not heard about the biopsies in 3 weeks.   Await for biopsy results to determine next repeat Colonoscopy screening Polyps (handout given) Diverticulosis (hnadout given) Hemorrhoids (handout given) High Fiber Diet (handout given) .Marland KitchenNo ibuprofen, naproxen or other non-steroidal anti-inflammatory drugs for 2 weeks after polyp removal. Tylenol okay if needed. Strict Gluten free diet possible   SIGNATURES/CONFIDENTIALITY: You and/or your care partner have signed paperwork which will be entered into your electronic medical record.  These signatures  attest to the fact that that the information above on your After Visit Summary has been reviewed and is understood.  Full responsibility of the confidentiality of this discharge information lies with you and/or your care-partner.

## 2017-05-27 NOTE — Op Note (Signed)
[place] Patient Name: Christine Leon Procedure Date: 05/27/2017 2:01 PM MRN: [id number] Endoscopist: Remo Lipps P. Emelynn Rance MD, MD Age: 66 Referring MD:  Date of Birth: 1951/01/31 Gender: Female Account #: [id number] Procedure:                Upper GI endoscopy Indications:              Follow-up of history of celiac disease. Remote                            diagnosis. TTG mildly positive. Medicines:                Monitored Anesthesia Care Procedure:                Pre-Anesthesia Assessment:                           - Prior to the procedure, a History and Physical                            was performed, and patient medications and                            allergies were reviewed. The patient's tolerance of                            previous anesthesia was also reviewed. The risks                            and benefits of the procedure and the sedation                            options and risks were discussed with the patient.                            All questions were answered, and informed consent                            was obtained. Prior Anticoagulants: The patient has                            taken no previous anticoagulant or antiplatelet                            agents. ASA Grade Assessment: II - A patient with                            mild systemic disease. After reviewing the risks                            and benefits, the patient was deemed in                            satisfactory condition to undergo the procedure.  After obtaining informed consent, the endoscope was                            passed under direct vision. Throughout the                            procedure, the patient's blood pressure, pulse, and                            oxygen saturations were monitored continuously. The                            Endoscope was introduced through the mouth, and                            advanced to the  second part of duodenum. The upper                            GI endoscopy was accomplished without difficulty.                            The patient tolerated the procedure well. Scope In: Scope Out: Findings:                 Esophagogastric landmarks were identified: the                            Z-line was found at 36 cm, the gastroesophageal                            junction was found at 36 cm and the upper extent of                            the gastric folds was found at 36 cm from the                            incisors.                           The exam of the esophagus was otherwise normal.                           The entire examined stomach was normal.                           Scalloped mucosa was found in the second portion of                            the duodenum, and mildly in the bulb, consistent                            with celiac disease. Biopsies for histology were  taken with a cold forceps for evaluation of celiac                            disease. No other significant pathology noted. Complications:            No immediate complications. Estimated blood loss:                            Minimal. Estimated Blood Loss:     Estimated blood loss was minimal. Impression:               - Esophagogastric landmarks identified.                           - Normal esophagus.                           - Normal stomach.                           - Duodenal mucosal changes seen, suspicious for                            celiac disease. Biopsied. Recommendation:           - Patient has a contact number available for                            emergencies. The signs and symptoms of potential                            delayed complications were discussed with the                            patient. Return to normal activities tomorrow.                            Written discharge instructions were provided to the                             patient.                           - Strict gluten free diet if possible.                           - Continue present medications.                           - Await pathology results. Remo Lipps P. Gaile [name] MD, MD 05/27/2017 2:46:48 PM This report has been signed electronically.

## 2017-05-27 NOTE — Op Note (Signed)
[place] Patient Name: Christine Leon Procedure Date: 05/27/2017 2:01 PM MRN: [id number] Endoscopist: Remo Lipps P.  MD, MD Age: 66 Referring MD:  Date of Birth: 1950-11-09 Gender: Female Account #: [id number] Procedure:                Colonoscopy Indications:              Abnormal CT of the GI tract - right sided colonic                            inflammation Medicines:                Monitored Anesthesia Care Procedure:                Pre-Anesthesia Assessment:                           - Prior to the procedure, a History and Physical                            was performed, and patient medications and                            allergies were reviewed. The patient's tolerance of                            previous anesthesia was also reviewed. The risks                            and benefits of the procedure and the sedation                            options and risks were discussed with the patient.                            All questions were answered, and informed consent                            was obtained. Prior Anticoagulants: The patient has                            taken no previous anticoagulant or antiplatelet                            agents. ASA Grade Assessment: II - A patient with                            mild systemic disease. After reviewing the risks                            and benefits, the patient was deemed in                            satisfactory condition to undergo the procedure.  After obtaining informed consent, the colonoscope                            was passed under direct vision. Throughout the                            procedure, the patient's blood pressure, pulse, and                            oxygen saturations were monitored continuously. The                            Colonoscope was introduced through the anus and                            advanced to the the cecum, identified  by                            appendiceal orifice and ileocecal valve. The                            colonoscopy was performed without difficulty. The                            patient tolerated the procedure well. The quality                            of the bowel preparation was good. The ileocecal                            valve, appendiceal orifice, and rectum were                            photographed. Scope In: 2:18:50 PM Scope Out: 2:37:09 PM Scope Withdrawal Time: 0 hours 13 minutes 16 seconds  Total Procedure Duration: 0 hours 18 minutes 19 seconds  Findings:                 The perianal and digital rectal examinations were                            normal.                           Multiple medium-mouthed diverticula were found in                            the sigmoid colon. A few noted in the transverse                            colon.                           A 4 mm polyp was found in the transverse colon. The  polyp was sessile. The polyp was removed with a                            cold snare. Resection and retrieval were complete.                           Two sessile polyps were found in the sigmoid colon.                            The polyps were 4 mm in size. These polyps were                            removed with a cold snare. Resection and retrieval                            were complete.                           Internal hemorrhoids were found during retroflexion.                           The exam was otherwise without abnormality. The                            right colon was normal. Complications:            No immediate complications. Estimated blood loss:                            Minimal. Estimated Blood Loss:     Estimated blood loss was minimal. Impression:               - Diverticulosis in the sigmoid colon.                           - One 4 mm polyp in the transverse colon, removed                             with a cold snare. Resected and retrieved.                           - Two 4 mm polyps in the sigmoid colon, removed                            with a cold snare. Resected and retrieved.                           - Internal hemorrhoids.                           - The examination was otherwise normal.                           Overall, no pathology noted to cause prior CT scan  findings - I suspect this may have represented self                            limited changes from infection. Recommendation:           - Patient has a contact number available for                            emergencies. The signs and symptoms of potential                            delayed complications were discussed with the                            patient. Return to normal activities tomorrow.                            Written discharge instructions were provided to the                            patient.                           - Resume previous diet.                           - Continue present medications.                           - Await pathology results.                           - Repeat colonoscopy is recommended for                            surveillance. The colonoscopy date will be                            determined after pathology results from today's                            exam become available for review.                           - No ibuprofen, naproxen, or other non-steroidal                            anti-inflammatory drugs for 2 weeks after polyp                            removal. Remo Lipps P.  MD, MD 05/27/2017 2:43:27 PM This report has been signed electronically.

## 2017-05-28 ENCOUNTER — Telehealth: Payer: Self-pay | Admitting: *Deleted

## 2017-05-28 NOTE — Telephone Encounter (Signed)
  Follow up Call-  Call back number 05/27/2017  Post procedure Call Back phone  # [phone]  Permission to leave phone message Yes  Some recent data might be hidden     Patient questions:  Do you have a fever, pain , or abdominal swelling? No. Pain Score  0 *  Have you tolerated food without any problems? Yes.    Have you been able to return to your normal activities? Yes.    Do you have any questions about your discharge instructions: Diet   No. Medications  No. Follow up visit  No.  Do you have questions or concerns about your Care? No.  Actions: * If pain score is 4 or above: No action needed, pain <4.

## 2017-06-02 ENCOUNTER — Encounter: Payer: Self-pay | Admitting: Gastroenterology

## 2017-06-07 ENCOUNTER — Encounter: Payer: Self-pay | Admitting: Family Medicine

## 2017-06-07 DIAGNOSIS — Z86010 Personal history of colonic polyps: Secondary | ICD-10-CM | POA: Insufficient documentation

## 2017-06-23 DIAGNOSIS — E039 Hypothyroidism, unspecified: Secondary | ICD-10-CM | POA: Diagnosis not present

## 2017-06-30 DIAGNOSIS — E039 Hypothyroidism, unspecified: Secondary | ICD-10-CM | POA: Diagnosis not present

## 2017-07-11 ENCOUNTER — Other Ambulatory Visit: Payer: Self-pay | Admitting: Family Medicine

## 2017-07-13 DIAGNOSIS — Z923 Personal history of irradiation: Secondary | ICD-10-CM

## 2017-07-13 HISTORY — PX: BREAST LUMPECTOMY: SHX2

## 2017-07-13 HISTORY — DX: Personal history of irradiation: Z92.3

## 2017-08-05 DIAGNOSIS — R69 Illness, unspecified: Secondary | ICD-10-CM | POA: Diagnosis not present

## 2017-08-31 DIAGNOSIS — E039 Hypothyroidism, unspecified: Secondary | ICD-10-CM | POA: Diagnosis not present

## 2017-09-29 ENCOUNTER — Telehealth: Payer: Self-pay | Admitting: Family Medicine

## 2017-09-29 NOTE — Telephone Encounter (Signed)
Pt declined AWV

## 2017-10-12 DIAGNOSIS — E039 Hypothyroidism, unspecified: Secondary | ICD-10-CM | POA: Diagnosis not present

## 2017-11-25 DIAGNOSIS — Z0100 Encounter for examination of eyes and vision without abnormal findings: Secondary | ICD-10-CM | POA: Diagnosis not present

## 2017-12-16 DIAGNOSIS — E039 Hypothyroidism, unspecified: Secondary | ICD-10-CM | POA: Diagnosis not present

## 2017-12-20 DIAGNOSIS — H524 Presbyopia: Secondary | ICD-10-CM | POA: Diagnosis not present

## 2017-12-20 DIAGNOSIS — H33303 Unspecified retinal break, bilateral: Secondary | ICD-10-CM | POA: Diagnosis not present

## 2017-12-20 DIAGNOSIS — H5213 Myopia, bilateral: Secondary | ICD-10-CM | POA: Diagnosis not present

## 2017-12-27 ENCOUNTER — Other Ambulatory Visit: Payer: Self-pay | Admitting: Family Medicine

## 2017-12-29 ENCOUNTER — Telehealth: Payer: Self-pay | Admitting: *Deleted

## 2017-12-29 NOTE — Telephone Encounter (Signed)
PA request received via fax for Estradiol 0.05 mg/24 hr patches. PA initiated by pharmacy via CoverMyMeds but needs to be completed. Key: [id number]

## 2018-01-05 NOTE — Telephone Encounter (Signed)
PA was submitted and was approved

## 2018-03-09 DIAGNOSIS — J309 Allergic rhinitis, unspecified: Secondary | ICD-10-CM | POA: Diagnosis not present

## 2018-03-09 DIAGNOSIS — Z8249 Family history of ischemic heart disease and other diseases of the circulatory system: Secondary | ICD-10-CM | POA: Diagnosis not present

## 2018-03-09 DIAGNOSIS — E669 Obesity, unspecified: Secondary | ICD-10-CM | POA: Diagnosis not present

## 2018-03-09 DIAGNOSIS — I10 Essential (primary) hypertension: Secondary | ICD-10-CM | POA: Diagnosis not present

## 2018-03-09 DIAGNOSIS — Z79890 Hormone replacement therapy (postmenopausal): Secondary | ICD-10-CM | POA: Diagnosis not present

## 2018-03-09 DIAGNOSIS — R69 Illness, unspecified: Secondary | ICD-10-CM | POA: Diagnosis not present

## 2018-03-09 DIAGNOSIS — Z825 Family history of asthma and other chronic lower respiratory diseases: Secondary | ICD-10-CM | POA: Diagnosis not present

## 2018-03-09 DIAGNOSIS — Z7722 Contact with and (suspected) exposure to environmental tobacco smoke (acute) (chronic): Secondary | ICD-10-CM | POA: Diagnosis not present

## 2018-03-09 DIAGNOSIS — E039 Hypothyroidism, unspecified: Secondary | ICD-10-CM | POA: Diagnosis not present

## 2018-03-09 DIAGNOSIS — Z6832 Body mass index (BMI) 32.0-32.9, adult: Secondary | ICD-10-CM | POA: Diagnosis not present

## 2018-03-21 ENCOUNTER — Other Ambulatory Visit: Payer: Self-pay | Admitting: Family Medicine

## 2018-03-21 DIAGNOSIS — N6489 Other specified disorders of breast: Secondary | ICD-10-CM

## 2018-03-29 ENCOUNTER — Other Ambulatory Visit: Payer: Self-pay | Admitting: Family Medicine

## 2018-03-29 ENCOUNTER — Ambulatory Visit
Admission: RE | Admit: 2018-03-29 | Discharge: 2018-03-29 | Disposition: A | Discharge status: Home / Self Care | Payer: Medicare HMO | Source: Ambulatory Visit | Attending: Family Medicine | Admitting: Family Medicine

## 2018-03-29 DIAGNOSIS — N6310 Unspecified lump in the right breast, unspecified quadrant: Secondary | ICD-10-CM

## 2018-03-29 DIAGNOSIS — R928 Other abnormal and inconclusive findings on diagnostic imaging of breast: Secondary | ICD-10-CM | POA: Diagnosis not present

## 2018-03-29 DIAGNOSIS — N6489 Other specified disorders of breast: Secondary | ICD-10-CM

## 2018-03-29 DIAGNOSIS — N651 Disproportion of reconstructed breast: Secondary | ICD-10-CM | POA: Diagnosis not present

## 2018-03-29 DIAGNOSIS — N6311 Unspecified lump in the right breast, upper outer quadrant: Secondary | ICD-10-CM | POA: Diagnosis not present

## 2018-03-30 ENCOUNTER — Ambulatory Visit
Admission: RE | Admit: 2018-03-30 | Discharge: 2018-03-30 | Disposition: A | Discharge status: Home / Self Care | Payer: Medicare HMO | Source: Ambulatory Visit | Attending: Family Medicine | Admitting: Family Medicine

## 2018-03-30 DIAGNOSIS — N6310 Unspecified lump in the right breast, unspecified quadrant: Secondary | ICD-10-CM

## 2018-03-30 DIAGNOSIS — C50811 Malignant neoplasm of overlapping sites of right female breast: Secondary | ICD-10-CM | POA: Diagnosis not present

## 2018-03-30 DIAGNOSIS — N6311 Unspecified lump in the right breast, upper outer quadrant: Secondary | ICD-10-CM | POA: Diagnosis not present

## 2018-04-01 HISTORY — PX: BREAST BIOPSY: SHX20

## 2018-04-15 ENCOUNTER — Other Ambulatory Visit: Payer: Self-pay | Admitting: General Surgery

## 2018-04-15 DIAGNOSIS — C50111 Malignant neoplasm of central portion of right female breast: Secondary | ICD-10-CM

## 2018-04-19 ENCOUNTER — Telehealth: Payer: Self-pay | Admitting: Oncology

## 2018-04-19 ENCOUNTER — Ambulatory Visit (INDEPENDENT_AMBULATORY_CARE_PROVIDER_SITE_OTHER): Payer: Medicare HMO | Admitting: Family Medicine

## 2018-04-19 ENCOUNTER — Encounter: Payer: Self-pay | Admitting: Family Medicine

## 2018-04-19 VITALS — BP 110/68 | HR 64 | Temp 97.7°F | Ht 65.0 in | Wt 198.4 lb

## 2018-04-19 DIAGNOSIS — K900 Celiac disease: Secondary | ICD-10-CM | POA: Diagnosis not present

## 2018-04-19 DIAGNOSIS — Z0000 Encounter for general adult medical examination without abnormal findings: Secondary | ICD-10-CM | POA: Diagnosis not present

## 2018-04-19 DIAGNOSIS — E034 Atrophy of thyroid (acquired): Secondary | ICD-10-CM | POA: Diagnosis not present

## 2018-04-19 DIAGNOSIS — I700 Atherosclerosis of aorta: Secondary | ICD-10-CM | POA: Diagnosis not present

## 2018-04-19 DIAGNOSIS — R232 Flushing: Secondary | ICD-10-CM

## 2018-04-19 DIAGNOSIS — I10 Essential (primary) hypertension: Secondary | ICD-10-CM

## 2018-04-19 DIAGNOSIS — Z23 Encounter for immunization: Secondary | ICD-10-CM

## 2018-04-19 MED ORDER — ESCITALOPRAM OXALATE 5 MG PO TABS: 5.0000 mg | ORAL_TABLET | Freq: Every day | ORAL | 5 refills | Status: DC

## 2018-04-19 NOTE — Progress Notes (Signed)
Phone: [phone]  Subjective:  Patient presents today for their annual physical. Chief complaint-noted.   See problem oriented charting- ROS- full  review of systems was completed and negative except for: seasonal allergies  The following were reviewed and entered/updated in epic: Past Medical History:  Diagnosis Date  . Allergy   . Celiac sprue   . Hypertension   . Hypothyroidism   . PONV (postoperative nausea and vomiting)    Patient Active Problem List   Diagnosis Date Noted  . Hot flashes 08/15/2014    Priority: Medium  . Obesity 08/15/2014    Priority: Medium  . Hypothyroidism 07/19/2014    Priority: Medium  . CELIAC SPRUE 04/20/2008    Priority: Medium  . Essential hypertension 06/05/2007    Priority: Medium  . Retinal tear 08/15/2014    Priority: Low  . Left knee pain 07/25/2014    Priority: Low  . Allergic rhinitis 07/20/2007    Priority: Low  . History of adenomatous polyp of colon 06/07/2017  . Aortic atherosclerosis ([place]) 04/15/2017   Past Surgical History:  Procedure Laterality Date  . ABDOMINAL HYSTERECTOMY     in 36s. fibroids. including cervix removal. per patient was told no further pap smears after.   Marland Kitchen arthroscopic knee surgery     2015 under Dr. Theda Sers  . CHOLECYSTECTOMY    . COCCYX REMOVAL     after cheerleading accident  . OOPHORECTOMY     in 61s  . TONSILLECTOMY      Family History  Problem Relation Age of Onset  . Hypertension Mother   . Coronary artery disease Father        74  . COPD Father        passed age 24  . Breast cancer Maternal Aunt     Medications- reviewed and updated Current Outpatient Medications  Medication Sig Dispense Refill  . ALPHA LIPOIC ACID PO Take 1 tablet by mouth daily.    . Cholecalciferol (VITAMIN D-3 PO) Take 1,500 Units by mouth daily.    . fish oil-omega-3 fatty acids 1000 MG capsule Take 1 g by mouth daily.      . fluticasone (FLONASE) 50 MCG/ACT nasal spray Place 2 sprays into both  nostrils daily. 16 g 6  . glucosamine-chondroitin 500-400 MG tablet Take 1 tablet by mouth daily.      Marland Kitchen levothyroxine (SYNTHROID, LEVOTHROID) 125 MCG tablet Take 125 mcg by mouth daily before breakfast.    . lisinopril-hydrochlorothiazide (PRINZIDE,ZESTORETIC) 20-25 MG tablet TAKE ONE TABLET BY MOUTH ONCE DAILY 90 tablet 3  . vitamin B-12 (CYANOCOBALAMIN) 500 MCG tablet Take 500 mcg by mouth daily.    Marland Kitchen escitalopram (LEXAPRO) 5 MG tablet Take 1 tablet (5 mg total) by mouth daily. 30 tablet 5   No current facility-administered medications for this visit.     Allergies-reviewed and updated Allergies  Allergen Reactions  . Gluten Meal     Has celic  . Wheat Bran     Has celic   . Cephalexin Hives    Social History   Social History Narrative   Married (husband patient of Dr. Yong Channel), 2 children, 1 grandchild   Her mom lives with her. Dad passed 2017- had lived with them.       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 Oropharynx normal Neck: no thyromegaly CV: RRR no murmurs rubs or gallops Lungs: CTAB no crackles, wheeze, rhonchi Abdomen: soft/nontender/nondistended/normal bowel sounds. No rebound or guarding.  Ext: no edema Skin: warm, dry Neuro: grossly normal, moves all extremities, PERRLA  Monofilament exam intact. Reports some numbness/tingling in toes. Family history of neuropathy  Assessment/Plan:  67 y.o. female presenting for annual physical.  Health Maintenance counseling: 1. Anticipatory guidance: Patient counseled regarding regular dental exams -q6 months,  eye exams - yearly, wearing seatbelts.  2. Risk factor reduction:  Advised patient of need for regular exercise and diet rich and fruits and vegetables to reduce risk of heart attack and stroke. Exercise- doing yardwork to stay active, discussed increasing exercise and that can help with cancer related/treatment quality of life scores. . Diet-feels like doing better here- difficult with celiac disease to always eat well weight up 5 lbs over last year- has been stressful year.  Wt Readings from Last 3 Encounters:  04/19/18 198 lb 6.4 oz (90 kg)  05/27/17 194 lb (88 kg)  04/15/17 193 lb 9.6 oz (87.8 kg)  3. Immunizations/screenings/ancillary studies- flu shot today  Immunization History  Administered Date(s) Administered  . Influenza, High Dose Seasonal PF 05/21/2016, 04/19/2018  . Pneumococcal Conjugate-13 04/14/2016  . Pneumococcal Polysaccharide-23 04/15/2017  . Td 11/07/2008  . Tdap 11/24/2013  . Zoster 06/02/2013  4. Cervical cancer screening-  hysterectomy involving cervix due to fibroid uterus- no pap indicated 5. Breast cancer- ongoing treatment plan as below 6. Colon cancer screening - 5 year follow up planned in 2013 due to ulcerative colitis. - just had this 05/2017 with another colonoscopy in 2 years due to polyps 7. Skin cancer screening- no dermatologist. advised regular sunscreen use. Denies worrisome, changing, or new skin lesions.  8. Birth control/STD check- hysterectomy 9. Osteoporosis screening at 60- defers until next year or next visit  Status of chronic or acute concerns   Surgery on 16th- lumpectomy with sentinel lymph node biopsy planned for ductal carcinoma in situ. She is prepared for possibly having to take out more. Dr. Donne Hazel is about 99% sure that she will not need chemotherapy.   Dealing with allergies- has tried loratadine- is going to try zyrtec.   HTN- controlled on lisinopril-hctz 20-40m  Hypothyroidism- levothyroxine now at 125 mcg. Managed by Dr.  BChalmers Cater declines labs today  Celiac disease- remains gluten free as best she can.   Hot flashes Had been on estrogen replacement long term- she was aware of cancer and dvt/pe risks- unfortunately she did develop ductal carcinoma in situ. She has stopped estrogen. From avs "Start lexapro/escitalopram 5 mg- hoping you get at least a modest benefit in your hot flashes with this. Thank you for stopping the estradiol"  Aortic atherosclerosis ([place]: noted on prior imaging- ct chest A/P: likely stable. Continue risk factor modification with BP and lipid control. LDL has been ok- she declines to update lipids today   Future Appointments  Date Time Provider [place] 04/22/2018  9:00 AM Magrinat, GVirgie Dad MD [facility] None  04/26/2018  1:30 PM [facility] MM IR 1 [facility] GI-BREAST CE  04/27/2018  1:45 PM [facility] INJ 1 [facility]  04/27/2018  2:45 PM [facility] BREAST SPECIMEN [facility] GI-BREAST CE   Lab/Order associations:  NOT fasting. Declines labs given upcoming cancer treatments.  Need for prophylactic vaccination and inoculation against influenza - Plan: Flu vaccine HIGH DOSE PF  Hot flashes  Aortic atherosclerosis ([place])  Meds ordered this encounter  Medications  . escitalopram (LEXAPRO) 5 MG tablet    Sig: Take 1 tablet (5 mg total) by mouth daily.    Dispense:  30 tablet    Refill:  5   Return precautions advised.  Garret Reddish, MD

## 2018-04-19 NOTE — Patient Instructions (Addendum)
Health Maintenance Due  Topic Date Due  . DEXA SCAN -referral placed. Perhaps consider within 6 months  When things settle down Schedule your bone density test at check out desk. You may also call directly to X-ray at [phone] to schedule an appointment that is convenient for you.  - located [address] across the street from [place] - in the basement - you do need an appointment for the bone density tests.   05/21/2015  . INFLUENZA VACCINE-today  02/10/2018   Start lexapro/escitalopram 5 mg- hoping you get at least a modest benefit in your hot flashes with this. Thank you for stopping the estradiol  Taking the medicine as directed and not missing any doses is one of the best things you can do to treat your hot flashes.  Here are some things to keep in mind:  1) Side effects (stomach upset, some increased anxiety) may happen before you notice a benefit.  These side effects typically go away over time. 2) Changes to your dose of medicine or a change in medication all together is sometimes necessary 3) Many people will notice an improvement within two weeks for depression but lets give it at least 4-6 weeks before making adjustments 4) Stopping the medication when you start feeling better often results in a return of symptoms 5) If you start having thoughts of hurting yourself or others after starting this medicine, call our office immediately at [phone] or seek care through 911.

## 2018-04-19 NOTE — Telephone Encounter (Signed)
Urgent referral received from Dr. Donne Hazel for breast cancer to see Dr. Jana Hakim before [place]. Pt has been cld and scheduled to see Dr. Jana Hakim on 10/11 at 4pm. Pt aware to arrive 30 minutes early. Location to our facility given to the pt.

## 2018-04-20 ENCOUNTER — Encounter (HOSPITAL_BASED_OUTPATIENT_CLINIC_OR_DEPARTMENT_OTHER): Payer: Self-pay | Admitting: *Deleted

## 2018-04-20 NOTE — Progress Notes (Signed)
[place]  [phone] Fax:(336) [phone]     ID: Christine Leon DOB: June 14, 1951  MR#: [id number]  EQA#:[id number]  Patient Care Team: Marin Olp, MD as PCP - General (Family Medicine) Rolm Bookbinder, MD as Consulting Physician (General Surgery) Toran Murch, Virgie Dad, MD as Consulting Physician (Oncology) Eppie Gibson, MD as Attending Physician (Radiation Oncology) Armbruster, Carlota Raspberry, MD as Consulting Physician (Gastroenterology) OTHER MD:  CHIEF COMPLAINT: Estrogen receptor positive breast cancer  CURRENT TREATMENT: Awaiting definitive surgery   HISTORY OF CURRENT ILLNESS: Christine Leon "Christine Leon" had previous mammography in 2016 showing distortion of the right breast with benign findings along with a 41-monthfollow up. She did not have further mammography until she had bilateral diagnostic mammography with tomography and [facility] showing: breast density category B. There was a suspicious mass in the right breast upper outer quadrant at 12 o'clock measuring 0.5 x 0.4 x 0.4 cm. There were multiple adjacent cysts. There was no sonographic evidence of abnormal lymph nodes or adenopathy.   Accordingly on 03/30/2018 she proceeded to biopsy of the right breast area in question. The pathology from this procedure showed (([id number]: Invasive ductal carcinoma, grade I. Prognostic indicators significant for: estrogen receptor, 100% positive and progesterone receptor, 80% positive, both with string staining intensity. Proliferation marker Ki67 at 3%. HER2 negative with an immunohistochemistry of (1+).  The patient's subsequent history is as detailed below.  INTERVAL HISTORY: [name] was evaluated in the breast cancer clinic on 04/22/2018 accompanied by her husband. Her case was also presented at the multidisciplinary breast cancer conference on 04/13/2018. At that time a preliminary plan was  proposed: Breast conserving surgery, adjuvant radiation, antiestrogens   REVIEW OF SYSTEMS: There were no specific symptoms leading to the original mammogram, which was routinely scheduled. She is having regular hot flashes and she started Lexapro about 3 days ago. The patient denies unusual headaches, visual changes, nausea, vomiting, stiff neck, dizziness, or gait imbalance. There has been no cough, phlegm production, or pleurisy, no chest pain or pressure, and no change in bowel or bladder habits. The patient denies fever, rash, bleeding, unexplained fatigue or unexplained weight loss. A detailed review of systems was otherwise entirely negative.   PAST MEDICAL HISTORY: Past Medical History:  Diagnosis Date  . Allergy   . Celiac sprue   . Hypertension   . Hypothyroidism   . PONV (postoperative nausea and vomiting)     PAST SURGICAL HISTORY: Past Surgical History:  Procedure Laterality Date  . ABDOMINAL HYSTERECTOMY     in 573s fibroids. including cervix removal. per patient was told no further pap smears after.   .Marland Kitchenarthroscopic knee surgery     2015 under Dr. CTheda Sers . CHOLECYSTECTOMY    . COCCYX REMOVAL     after cheerleading accident  . OOPHORECTOMY     in 519s . TONSILLECTOMY    Hysterectomy with BSO (fibroids)  FAMILY HISTORY Family History  Problem Relation Age of Onset  . Hypertension Mother   . Coronary artery disease Father        711 . COPD Father        passed age 477 . Breast cancer Maternal Aunt   The patient's father died at age 449due to COPD and heart valve issues. The patient's mother is alive at age 425 The patient had 1 brother (deceased) and no sisters. There was a maternal aunt who was diagnosed with breast  cancer in the 9's. The patient' denies any other family members with breast, ovarian, or other cancers.  The patient does not meet criteria for genetics testing  GYNECOLOGIC HISTORY:  No LMP recorded (lmp unknown). Patient has had a  hysterectomy. Menarche: [age] years old Age at first live birth: 67 years old  She is GX P2. She is status post total hysterectomy with BSO in 1995. Her LMP was in her late 45's. She took HRT for a few years and recently discontinued.     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 Her husband, [name], is retired from working in Scientist, physiological for [employer]. The patient's oldest, a son named [name], lives in [place] as a 5th Land. The patient's youngest, a daughter named [name], lives in [place] as a Print production planner. The patient has 4 grandchildren. The patient does not attend church.      ADVANCED DIRECTIVES:    HEALTH MAINTENANCE: Social History   Tobacco Use  . Smoking status: Former Smoker    Packs/day: 1.00    Years: 39.00    Pack years: 39.00    Types: Cigarettes    Last attempt to quit: 07/13/2006    Years since quitting: 11.7  . Smokeless tobacco: Former Network engineer Use Topics  . Alcohol use: Yes    Alcohol/week: 1.0 standard drinks    Types: 1 Standard drinks or equivalent per week  . Drug use: No     Colonoscopy: 2018/ Dr. Havery Moros (repeat in 2 years)  PAP:  Bone density: scheduled for 2019   Allergies  Allergen Reactions  . Gluten Meal     Has celic  . Wheat Bran     Has celic   . Cephalexin Hives    Current Outpatient Medications  Medication Sig Dispense Refill  . ALPHA LIPOIC ACID PO Take 1 tablet by mouth daily.    . Cholecalciferol (VITAMIN D-3 PO) Take 1,500 Units by mouth daily.    Marland Kitchen escitalopram (LEXAPRO) 5 MG tablet Take 1 tablet (5 mg total) by mouth daily. 30 tablet 5  . fish oil-omega-3 fatty acids 1000 MG capsule Take 1 g by mouth daily.      . fluticasone (FLONASE) 50 MCG/ACT nasal spray Place 2 sprays into both nostrils daily. 16 g 6  . glucosamine-chondroitin 500-400 MG tablet Take 1 tablet by mouth daily.      Marland Kitchen levothyroxine (SYNTHROID, LEVOTHROID) 125 MCG tablet Take 125 mcg by mouth  daily before breakfast.    . lisinopril-hydrochlorothiazide (PRINZIDE,ZESTORETIC) 20-25 MG tablet TAKE ONE TABLET BY MOUTH ONCE DAILY 90 tablet 3  . vitamin B-12 (CYANOCOBALAMIN) 500 MCG tablet Take 500 mcg by mouth daily.     No current facility-administered medications for this visit.     OBJECTIVE: Middle-aged white woman in no acute distress  Vitals:   04/22/18 0830  BP: (!) 139/58  Pulse: 71  Resp: 18  Temp: 97.7 F (36.5 C)  SpO2: 98%     Body mass index is 33.1 kg/m.   Wt Readings from Last 3 Encounters:  04/22/18 198 lb 14.4 oz (90.2 kg)  04/19/18 198 lb 6.4 oz (90 kg)  05/27/17 194 lb (88 kg)      ECOG FS:0 - Asymptomatic  Ocular: Sclerae unicteric, pupils round and equal Ear-nose-throat: Oropharynx clear and moist Lymphatic: No cervical or supraclavicular adenopathy Lungs no rales or rhonchi Heart regular rate and rhythm Abd soft, nontender, positive bowel sounds MSK no  focal spinal tenderness, no joint edema Neuro: non-focal, well-oriented, positive affect Breasts: The right breast is status post recent biopsy.  There is no hematoma.  There is no palpable mass.  There are no skin or nipple changes of concern.  The left breast is benign.  Both axillae are benign.   LAB RESULTS:  CMP     Component Value Date/Time   NA 140 04/15/2017 1039   K 3.8 04/15/2017 1039   CL 104 04/15/2017 1039   CO2 26 04/15/2017 1039   GLUCOSE 84 04/15/2017 1039   BUN 10 04/15/2017 1039   CREATININE 0.79 04/15/2017 1039   CALCIUM 9.7 04/15/2017 1039   PROT 7.3 04/15/2017 1039   ALBUMIN 4.6 04/15/2017 1039   AST 32 04/15/2017 1039   ALT 55 (H) 04/15/2017 1039   ALKPHOS 60 04/15/2017 1039   BILITOT 0.8 04/15/2017 1039   GFRNONAA >60 12/27/2016 0637   GFRAA >60 12/27/2016 0637    No results found for: TOTALPROTELP, ALBUMINELP, A1GS, A2GS, BETS, BETA2SER, GAMS, MSPIKE, SPEI  No results found for: KPAFRELGTCHN, LAMBDASER, KAPLAMBRATIO  Lab Results  Component Value Date     WBC 4.5 04/15/2017   NEUTROABS 4.6 12/27/2016   HGB 14.7 04/15/2017   HCT 44.3 04/15/2017   MCV 91.2 04/15/2017   PLT 271.0 04/15/2017    @LASTCHEMISTRY @  No results found for: LABCA2  No components found for: [id number]  No results for input(s): INR in the last 168 hours.  No results found for: LABCA2  No results found for: ATF573  No results found for: UKG254  No results found for: YHC623  No results found for: CA2729  No components found for: HGQUANT  No results found for: CEA1 / No results found for: CEA1   No results found for: AFPTUMOR  No results found for: CHROMOGRNA  No results found for: PSA1  No visits with results within 3 Day(s) from this visit.  Latest known visit with results is:  Office Visit on 04/15/2017  Component Date Value Ref Range Status  . WBC 04/15/2017 4.5  4.0 - 10.5 K/uL Final  . RBC 04/15/2017 4.86  3.87 - 5.11 Mil/uL Final  . Platelets 04/15/2017 271.0  150.0 - 400.0 K/uL Final  . Hemoglobin 04/15/2017 14.7  12.0 - 15.0 g/dL Final  . HCT 04/15/2017 44.3  36.0 - 46.0 % Final  . MCV 04/15/2017 91.2  78.0 - 100.0 fl Final  . MCHC 04/15/2017 33.2  30.0 - 36.0 g/dL Final  . RDW 04/15/2017 14.1  11.5 - 15.5 % Final  . Sodium 04/15/2017 140  135 - 145 mEq/L Final  . Potassium 04/15/2017 3.8  3.5 - 5.1 mEq/L Final  . Chloride 04/15/2017 104  96 - 112 mEq/L Final  . CO2 04/15/2017 26  19 - 32 mEq/L Final  . Glucose, Bld 04/15/2017 84  70 - 99 mg/dL Final  . BUN 04/15/2017 10  6 - 23 mg/dL Final  . Creatinine, Ser 04/15/2017 0.79  0.40 - 1.20 mg/dL Final  . Total Bilirubin 04/15/2017 0.8  0.2 - 1.2 mg/dL Final  . Alkaline Phosphatase 04/15/2017 60  39 - 117 U/L Final  . AST 04/15/2017 32  0 - 37 U/L Final  . ALT 04/15/2017 55* 0 - 35 U/L Final  . Total Protein 04/15/2017 7.3  6.0 - 8.3 g/dL Final  . Albumin 04/15/2017 4.6  3.5 - 5.2 g/dL Final  . Calcium 04/15/2017 9.7  8.4 - 10.5 mg/dL Final  . GFR 04/15/2017 77.18  >  60.00 mL/min  Final  . Cholesterol 04/15/2017 160  0 - 200 mg/dL Final   ATP III Classification       Desirable:  < 200 mg/dL               Borderline High:  200 - 239 mg/dL          High:  > = 240 mg/dL  . Triglycerides 04/15/2017 118.0  0.0 - 149.0 mg/dL Final   Normal:  <150 mg/dLBorderline High:  150 - 199 mg/dL  . HDL 04/15/2017 43.10  >39.00 mg/dL Final  . VLDL 04/15/2017 23.6  0.0 - 40.0 mg/dL Final  . LDL Cholesterol 04/15/2017 93  0 - 99 mg/dL Final  . Total CHOL/HDL Ratio 04/15/2017 4   Final                  Men          Women1/2 Average Risk     3.4          3.3Average Risk          5.0          4.42X Average Risk          9.6          7.13X Average Risk          15.0          11.0                      . NonHDL 04/15/2017 116.60   Final   NOTE:  Non-HDL goal should be 30 mg/dL higher than patient's LDL goal (i.e. LDL goal of < 70 mg/dL, would have non-HDL goal of < 100 mg/dL)  . Color, UA 04/15/2017 Yellow   Final  . Clarity, UA 04/15/2017 Clear   Final  . Glucose, UA 04/15/2017 Negative   Final  . Bilirubin, UA 04/15/2017 Negative   Final  . Ketones, UA 04/15/2017 Negative   Final  . Spec Grav, UA 04/15/2017 1.025  1.010 - 1.025 Final  . Blood, UA 04/15/2017 Negative   Final  . pH, UA 04/15/2017 6.0  5.0 - 8.0 Final  . Protein, UA 04/15/2017 Negative   Final  . Urobilinogen, UA 04/15/2017 0.2  0.2 or 1.0 E.U./dL Final  . Nitrite, UA 04/15/2017 Negative   Final  . Leukocytes, UA 04/15/2017 Moderate (2+)* Negative Final    (this displays the last labs from the last 3 days)  No results found for: TOTALPROTELP, ALBUMINELP, A1GS, A2GS, BETS, BETA2SER, GAMS, MSPIKE, SPEI (this displays SPEP labs)  No results found for: KPAFRELGTCHN, LAMBDASER, KAPLAMBRATIO (kappa/lambda light chains)  No results found for: HGBA, HGBA2QUANT, HGBFQUANT, HGBSQUAN (Hemoglobinopathy evaluation)   No results found for: LDH  No results found for: IRON, TIBC, IRONPCTSAT (Iron and TIBC)  No results found  for: FERRITIN  Urinalysis    Component Value Date/Time   COLORURINE YELLOW 12/27/2016 0626   APPEARANCEUR CLEAR 12/27/2016 0626   LABSPEC 1.006 12/27/2016 0626   PHURINE 7.0 12/27/2016 0626   GLUCOSEU NEGATIVE 12/27/2016 0626   HGBUR NEGATIVE 12/27/2016 0626   BILIRUBINUR Negative 04/15/2017 1047   KETONESUR NEGATIVE 12/27/2016 0626   PROTEINUR Negative 04/15/2017 1047   PROTEINUR NEGATIVE 12/27/2016 0626   UROBILINOGEN 0.2 04/15/2017 1047   NITRITE Negative 04/15/2017 1047   NITRITE NEGATIVE 12/27/2016 0626   LEUKOCYTESUR Moderate (2+) (A) 04/15/2017 1047     STUDIES: US Breast Ltd Uni Right Inc Axilla  Result Date: 03/29/2018 CLINICAL DATA:  Delayed screening recall for a right breast distortion. EXAM: DIGITAL DIAGNOSTIC BILATERAL MAMMOGRAM WITH CAD AND TOMO ULTRASOUND RIGHT BREAST COMPARISON:  Previous exam(s). ACR Breast Density Category b: There are scattered areas of fibroglandular density. FINDINGS: There is a distortion in the superior right breast at the approximate 12 o'clock position. No suspicious calcifications, masses or areas of distortion are seen in the bilateral breasts. Mammographic images were processed with CAD. Ultrasound of the right breast at 12 o'clock, 6 cm from the nipple demonstrates an irregular shadowing hypoechoic mass measuring 5 x 4 x 4 mm. There are multiple adjacent benign cysts and clusters of cysts. Ultrasound of the right axilla demonstrates normal-appearing lymph nodes. IMPRESSION: 1. There is a suspicious mass in the right breast at 12 o'clock which likely corresponds with the distortion identified mammographically. 2.  No evidence of right axillary lymphadenopathy. 3.  No mammographic evidence of left breast malignancy. RECOMMENDATION: 1. Ultrasound-guided biopsy is recommended for the right breast mass at 12 o'clock. Close correlation with the post biopsy mammogram is recommended to ensure that the clip resides at the site of distortion. I have  discussed the findings and recommendations with the patient. Results were also provided in writing at the conclusion of the visit. If applicable, a reminder letter will be sent to the patient regarding the next appointment. BI-RADS CATEGORY  4: Suspicious. Electronically Signed   By: Ammie Ferrier M.D.   On: 03/29/2018 10:41   Mm Diag Breast Tomo Bilateral  Result Date: 03/29/2018 CLINICAL DATA:  Delayed screening recall for a right breast distortion. EXAM: DIGITAL DIAGNOSTIC BILATERAL MAMMOGRAM WITH CAD AND TOMO ULTRASOUND RIGHT BREAST COMPARISON:  Previous exam(s). ACR Breast Density Category b: There are scattered areas of fibroglandular density. FINDINGS: There is a distortion in the superior right breast at the approximate 12 o'clock position. No suspicious calcifications, masses or areas of distortion are seen in the bilateral breasts. Mammographic images were processed with CAD. Ultrasound of the right breast at 12 o'clock, 6 cm from the nipple demonstrates an irregular shadowing hypoechoic mass measuring 5 x 4 x 4 mm. There are multiple adjacent benign cysts and clusters of cysts. Ultrasound of the right axilla demonstrates normal-appearing lymph nodes. IMPRESSION: 1. There is a suspicious mass in the right breast at 12 o'clock which likely corresponds with the distortion identified mammographically. 2.  No evidence of right axillary lymphadenopathy. 3.  No mammographic evidence of left breast malignancy. RECOMMENDATION: 1. Ultrasound-guided biopsy is recommended for the right breast mass at 12 o'clock. Close correlation with the post biopsy mammogram is recommended to ensure that the clip resides at the site of distortion. I have discussed the findings and recommendations with the patient. Results were also provided in writing at the conclusion of the visit. If applicable, a reminder letter will be sent to the patient regarding the next appointment. BI-RADS CATEGORY  4: Suspicious. Electronically  Signed   By: Ammie Ferrier M.D.   On: 03/29/2018 10:41   Mm Clip Placement Right  Result Date: 03/30/2018 CLINICAL DATA:  Evaluate clip placement following ultrasound-guided RIGHT breast biopsy. EXAM: DIAGNOSTIC RIGHT MAMMOGRAM POST ULTRASOUND BIOPSY COMPARISON:  Previous exam(s). FINDINGS: Mammographic images were obtained following ultrasound guided biopsy of the 5 mm mass within the UPPER RIGHT breast. The RIBBON shaped clip is in satisfactory position and corresponds to the architectural distortion identified mammographically. IMPRESSION: Satisfactory RIBBON clip position following ultrasound-guided RIGHT breast biopsy. The 5 mm mass identified sonographically corresponds to the  architectural distortion identified mammographically. Final Assessment: Post Procedure Mammograms for Marker Placement Electronically Signed   By: Margarette Canada M.D.   On: 03/30/2018 10:27   [place] Rt Breast Bx W Loc Dev 1st Lesion Img Bx Spec US Guide  Addendum Date: 04/01/2018   ADDENDUM REPORT: 03/31/2018 12:59 ADDENDUM: Pathology revealed GRADE I INVASIVE DUCTAL CARCINOMA of RIGHT breast, upper, 12 o'clock position. This was found to be concordant by Dr. Hassan Rowan. Pathology results were discussed with the patient by telephone. The patient reported doing well after the biopsy with tenderness at the site. Post biopsy instructions and care were reviewed and questions were answered. The patient was encouraged to call [facility] for any additional concerns. Surgical consultation has been arranged with Dr. Rolm Bookbinder per patient request at [facility] on April 15, 2018. Pathology results reported by Roselind Messier, RN on 03/31/2018. Electronically Signed   By: Margarette Canada M.D.   On: 03/31/2018 12:59   Result Date: 04/01/2018 CLINICAL DATA:  67 year old female for tissue sampling of 5 mm UPPER RIGHT breast mass. EXAM: ULTRASOUND GUIDED RIGHT BREAST CORE NEEDLE BIOPSY COMPARISON:  Previous  exam(s). FINDINGS: I met with the patient and we discussed the procedure of ultrasound-guided biopsy, including benefits and alternatives. We discussed the high likelihood of a successful procedure. We discussed the risks of the procedure, including infection, bleeding, tissue injury, clip migration, and inadequate sampling. Informed written consent was given. The usual time-out protocol was performed immediately prior to the procedure. Lesion quadrant: UPPER-OUTER Using sterile technique and 1% Lidocaine as local anesthetic, under direct ultrasound visualization, a 12 gauge spring-loaded device was used to perform biopsy of the 5 mm mass at the 11 30 to 12 o'clock position of the RIGHT breast using a LATERAL approach. At the conclusion of the procedure a RIBBON tissue marker clip was deployed into the biopsy cavity. Follow up 2 view mammogram was performed and dictated separately. IMPRESSION: Ultrasound guided biopsy of 5 mm UPPER RIGHT breast mass. No apparent complications. Electronically Signed: By: Margarette Canada M.D. On: 03/30/2018 09:56    ELIGIBLE FOR AVAILABLE RESEARCH PROTOCOL: no  ASSESSMENT: 67 y.o. Jule Ser, [place] woman status post right breast upper outer quadrant biopsy 03/30/2018 for a clinical T1a N0, stage IA invasive ductal carcinoma, grade 1, estrogen and progesterone receptor positive, HER-2 not amplified, with an MIB-1 of 3%.  (1) definitive surgery pending  (2) adjuvant radiation to follow  (3) consider antiestrogens at the completion of local treatment  PLAN: I spent approximately 60 minutes face to face with Leylani with more than 50% of that time spent in counseling and coordination of care. Specifically we reviewed the biology of the patient's diagnosis and the specifics of her situation.  We first reviewed the fact that cancer is not one disease but more than 100 different diseases and that it is important to keep them separate-- otherwise when friends and relatives discuss  their own cancer experiences with Demoni confusion can result. Similarly we explained that if breast cancer spreads to the bone or liver, the patient would not have bone cancer or liver cancer, but breast cancer in the bone and breast cancer in the liver: one cancer in three places-- not 3 different cancers which otherwise would have to be treated in 3 different ways.  We discussed the difference between local and systemic therapy. In terms of loco-regional treatment, lumpectomy plus radiation is equivalent to mastectomy as far as survival is concerned. For this  reason, and because the cosmetic results are generally superior, we recommend breast conserving surgery.   We then discussed the rationale for systemic therapy. There is some risk that this cancer may have already spread to other parts of her body. Patients frequently ask at this point about bone scans, CAT scans and PET scans to find out if they have occult breast cancer somewhere else. The problem is that in early stage disease we are much more likely to find false positives then true cancers and this would expose the patient to unnecessary procedures as well as unnecessary radiation. Scans cannot answer the question the patient really would like to know, which is whether she has microscopic disease elsewhere in her body. For those reasons we do not recommend them.  Of course we would proceed to aggressive evaluation of any symptoms that might suggest metastatic disease, but that is not the case here.  Next we went over the options for systemic therapy which are anti-estrogens, anti-HER-2 immunotherapy, and chemotherapy. Rhodie does not meet criteria for anti-HER-2 immunotherapy. She is a good candidate for anti-estrogens.  The question of chemotherapy is more complicated. Chemotherapy is most effective in rapidly growing, aggressive tumors. It is much less effective in low-grade, slow growing cancers, like Annitta 's. For that reason and  because in T1 a tumors any systemic therapy is optional, we are not going to obtain an Oncotype but will forego the chemotherapy option and consider antiestrogens at the completion of local treatment.    The plan then is for surgery followed by radiation and then consideration of antiestrogens.  Mykel has a good understanding of the overall plan. She agrees with it. She knows the goal of treatment in her case is cure. She will call with any problems that may develop before her next visit here, which will be mid January.   Aladdin Kollmann, Virgie Dad, MD  04/22/18 8:58 AM Medical Oncology and Hematology [facility] [address] [place], [place] [address] Tel. [phone]    Fax. [phone]  Alice Rieger, am acting as scribe for Chauncey Cruel MD.  I, Lurline Del MD, have reviewed the above documentation for accuracy and completeness, and I agree with the above.

## 2018-04-20 NOTE — Assessment & Plan Note (Signed)
S: noted on prior imaging- ct chest A/P: likely stable. Continue risk factor modification with BP and lipid control. LDL has been ok- she declines to update lipids today

## 2018-04-20 NOTE — Assessment & Plan Note (Signed)
Had been on estrogen replacement long term- she was aware of cancer and dvt/pe risks- unfortunately she did develop ductal carcinoma in situ. She has stopped estrogen. From avs "Start lexapro/escitalopram 5 mg- hoping you get at least a modest benefit in your hot flashes with this. Thank you for stopping the estradiol"

## 2018-04-21 ENCOUNTER — Encounter: Payer: Self-pay | Admitting: Radiation Oncology

## 2018-04-22 ENCOUNTER — Encounter (HOSPITAL_BASED_OUTPATIENT_CLINIC_OR_DEPARTMENT_OTHER)
Admission: RE | Admit: 2018-04-22 | Discharge: 2018-04-22 | Disposition: A | Discharge status: Home / Self Care | Payer: Medicare HMO | Source: Ambulatory Visit | Attending: General Surgery | Admitting: General Surgery

## 2018-04-22 ENCOUNTER — Telehealth: Payer: Self-pay | Admitting: Oncology

## 2018-04-22 ENCOUNTER — Inpatient Hospital Stay: Payer: Medicare HMO | Attending: Oncology | Admitting: Oncology

## 2018-04-22 ENCOUNTER — Encounter: Payer: Self-pay | Admitting: *Deleted

## 2018-04-22 DIAGNOSIS — Z79899 Other long term (current) drug therapy: Secondary | ICD-10-CM

## 2018-04-22 DIAGNOSIS — Z87891 Personal history of nicotine dependence: Secondary | ICD-10-CM | POA: Diagnosis not present

## 2018-04-22 DIAGNOSIS — Z01818 Encounter for other preprocedural examination: Secondary | ICD-10-CM | POA: Diagnosis not present

## 2018-04-22 DIAGNOSIS — Z803 Family history of malignant neoplasm of breast: Secondary | ICD-10-CM | POA: Diagnosis not present

## 2018-04-22 DIAGNOSIS — Z90710 Acquired absence of both cervix and uterus: Secondary | ICD-10-CM | POA: Diagnosis not present

## 2018-04-22 DIAGNOSIS — C50411 Malignant neoplasm of upper-outer quadrant of right female breast: Secondary | ICD-10-CM | POA: Diagnosis not present

## 2018-04-22 DIAGNOSIS — I10 Essential (primary) hypertension: Secondary | ICD-10-CM | POA: Diagnosis not present

## 2018-04-22 DIAGNOSIS — Z170 Estrogen receptor positive status [ER+]: Secondary | ICD-10-CM | POA: Diagnosis not present

## 2018-04-22 LAB — BASIC METABOLIC PANEL
Anion gap: 9 (ref 5–15)
BUN: 13 mg/dL (ref 8–23)
CHLORIDE: 106 mmol/L (ref 98–111)
CO2: 27 mmol/L (ref 22–32)
Calcium: 9.3 mg/dL (ref 8.9–10.3)
Creatinine, Ser: 0.82 mg/dL (ref 0.44–1.00)
GFR calc non Af Amer: >60 mL/min (ref >60)
Glucose, Bld: 110 mg/dL — ABNORMAL HIGH (ref 70–99)
POTASSIUM: 3.5 mmol/L (ref 3.5–5.1)
Sodium: 142 mmol/L (ref 135–145)

## 2018-04-22 MED ORDER — ENSURE PRE-SURGERY PO LIQD: 296.0000 mL | Freq: Once | ORAL | Status: DC

## 2018-04-22 NOTE — Telephone Encounter (Signed)
Gave pt avs and calendar  °

## 2018-04-22 NOTE — Progress Notes (Signed)
Patient given hibiclens and directions on how to use.  Patient given pre-surgery drink (ENSURE) and instruction to drink at 1015 am DOS.  Patient verbalized understanding of both.

## 2018-04-25 NOTE — Progress Notes (Signed)
Reviewed EKG with Dr. Ola Spurr.  Okay for surgery as planned.

## 2018-04-26 ENCOUNTER — Ambulatory Visit
Admission: RE | Admit: 2018-04-26 | Discharge: 2018-04-26 | Disposition: A | Discharge status: Home / Self Care | Payer: Medicare HMO | Source: Ambulatory Visit | Attending: General Surgery | Admitting: General Surgery

## 2018-04-26 DIAGNOSIS — C50111 Malignant neoplasm of central portion of right female breast: Secondary | ICD-10-CM

## 2018-04-26 DIAGNOSIS — R928 Other abnormal and inconclusive findings on diagnostic imaging of breast: Secondary | ICD-10-CM | POA: Diagnosis not present

## 2018-04-27 ENCOUNTER — Ambulatory Visit (HOSPITAL_BASED_OUTPATIENT_CLINIC_OR_DEPARTMENT_OTHER): Payer: Medicare HMO | Admitting: Anesthesiology

## 2018-04-27 ENCOUNTER — Encounter (HOSPITAL_COMMUNITY)
Admission: RE | Admit: 2018-04-27 | Discharge: 2018-04-27 | Disposition: A | Discharge status: Home / Self Care | Payer: Medicare HMO | Source: Ambulatory Visit | Attending: General Surgery | Admitting: General Surgery

## 2018-04-27 ENCOUNTER — Ambulatory Visit (HOSPITAL_BASED_OUTPATIENT_CLINIC_OR_DEPARTMENT_OTHER)
Admission: RE | Admit: 2018-04-27 | Discharge: 2018-04-27 | Disposition: A | Discharge status: Home / Self Care | Payer: Medicare HMO | Source: Ambulatory Visit | Attending: General Surgery | Admitting: General Surgery

## 2018-04-27 ENCOUNTER — Encounter (HOSPITAL_BASED_OUTPATIENT_CLINIC_OR_DEPARTMENT_OTHER)
Admission: RE | Disposition: A | Discharge status: Home / Self Care | Payer: Self-pay | Source: Ambulatory Visit | Attending: General Surgery

## 2018-04-27 ENCOUNTER — Encounter (HOSPITAL_BASED_OUTPATIENT_CLINIC_OR_DEPARTMENT_OTHER): Payer: Self-pay | Admitting: Certified Registered"

## 2018-04-27 ENCOUNTER — Ambulatory Visit
Admission: RE | Admit: 2018-04-27 | Discharge: 2018-04-27 | Disposition: A | Discharge status: Home / Self Care | Payer: Medicare HMO | Source: Ambulatory Visit | Attending: General Surgery | Admitting: General Surgery

## 2018-04-27 DIAGNOSIS — C50411 Malignant neoplasm of upper-outer quadrant of right female breast: Secondary | ICD-10-CM | POA: Diagnosis not present

## 2018-04-27 DIAGNOSIS — G8918 Other acute postprocedural pain: Secondary | ICD-10-CM | POA: Diagnosis not present

## 2018-04-27 DIAGNOSIS — I10 Essential (primary) hypertension: Secondary | ICD-10-CM | POA: Diagnosis not present

## 2018-04-27 DIAGNOSIS — Z79899 Other long term (current) drug therapy: Secondary | ICD-10-CM | POA: Diagnosis not present

## 2018-04-27 DIAGNOSIS — C50111 Malignant neoplasm of central portion of right female breast: Secondary | ICD-10-CM | POA: Insufficient documentation

## 2018-04-27 DIAGNOSIS — Z87891 Personal history of nicotine dependence: Secondary | ICD-10-CM | POA: Diagnosis not present

## 2018-04-27 DIAGNOSIS — Z79890 Hormone replacement therapy (postmenopausal): Secondary | ICD-10-CM | POA: Diagnosis not present

## 2018-04-27 DIAGNOSIS — E039 Hypothyroidism, unspecified: Secondary | ICD-10-CM | POA: Diagnosis not present

## 2018-04-27 DIAGNOSIS — R928 Other abnormal and inconclusive findings on diagnostic imaging of breast: Secondary | ICD-10-CM | POA: Diagnosis not present

## 2018-04-27 DIAGNOSIS — Z90710 Acquired absence of both cervix and uterus: Secondary | ICD-10-CM | POA: Insufficient documentation

## 2018-04-27 DIAGNOSIS — Z170 Estrogen receptor positive status [ER+]: Secondary | ICD-10-CM | POA: Diagnosis not present

## 2018-04-27 DIAGNOSIS — C50911 Malignant neoplasm of unspecified site of right female breast: Secondary | ICD-10-CM | POA: Diagnosis not present

## 2018-04-27 HISTORY — DX: Other specified postprocedural states: Z98.890

## 2018-04-27 HISTORY — PX: BREAST LUMPECTOMY WITH RADIOACTIVE SEED AND SENTINEL LYMPH NODE BIOPSY: SHX6550

## 2018-04-27 HISTORY — DX: Nausea with vomiting, unspecified: R11.2

## 2018-04-27 SURGERY — BREAST LUMPECTOMY WITH RADIOACTIVE SEED AND SENTINEL LYMPH NODE BIOPSY
Anesthesia: General | Laterality: Right

## 2018-04-27 MED ORDER — OXYCODONE HCL 5 MG/5ML PO SOLN: 5.0000 mg | Freq: Once | ORAL | Status: DC | PRN

## 2018-04-27 MED ORDER — PROPOFOL 10 MG/ML IV BOLUS
INTRAVENOUS | Status: DC | PRN
  Administered 2018-04-27: 150 mg via INTRAVENOUS

## 2018-04-27 MED ORDER — DEXAMETHASONE SODIUM PHOSPHATE 4 MG/ML IJ SOLN
INTRAMUSCULAR | Status: DC | PRN
  Administered 2018-04-27: 10 mg via INTRAVENOUS

## 2018-04-27 MED ORDER — BUPIVACAINE HCL (PF) 0.25 % IJ SOLN
INTRAMUSCULAR | Status: DC | PRN
  Administered 2018-04-27: 8 mL

## 2018-04-27 MED ORDER — PROMETHAZINE HCL 25 MG/ML IJ SOLN
6.2500 mg | Freq: Once | INTRAMUSCULAR | Status: AC | PRN
  Administered 2018-04-27: 6.25 mg via INTRAVENOUS

## 2018-04-27 MED ORDER — FENTANYL CITRATE (PF) 100 MCG/2ML IJ SOLN: 25.0000 ug | INTRAMUSCULAR | Status: DC | PRN

## 2018-04-27 MED ORDER — FENTANYL CITRATE (PF) 100 MCG/2ML IJ SOLN
50.0000 ug | INTRAMUSCULAR | Status: AC | PRN
  Administered 2018-04-27: 25 ug via INTRAVENOUS
  Administered 2018-04-27 (×2): 50 ug via INTRAVENOUS

## 2018-04-27 MED ORDER — PROMETHAZINE HCL 25 MG/ML IJ SOLN
INTRAMUSCULAR | Status: AC
  Filled 2018-04-27: qty 1

## 2018-04-27 MED ORDER — CIPROFLOXACIN IN D5W 400 MG/200ML IV SOLN
INTRAVENOUS | Status: AC
  Filled 2018-04-27: qty 200

## 2018-04-27 MED ORDER — FENTANYL CITRATE (PF) 100 MCG/2ML IJ SOLN
INTRAMUSCULAR | Status: AC
  Filled 2018-04-27: qty 2

## 2018-04-27 MED ORDER — OXYCODONE HCL 5 MG PO TABS: 5.0000 mg | ORAL_TABLET | Freq: Four times a day (QID) | ORAL | 0 refills | Status: DC | PRN

## 2018-04-27 MED ORDER — GABAPENTIN 100 MG PO CAPS
100.0000 mg | ORAL_CAPSULE | ORAL | Status: AC
  Administered 2018-04-27: 100 mg via ORAL

## 2018-04-27 MED ORDER — MIDAZOLAM HCL 2 MG/2ML IJ SOLN
INTRAMUSCULAR | Status: AC
  Filled 2018-04-27: qty 2

## 2018-04-27 MED ORDER — TECHNETIUM TC 99M SULFUR COLLOID FILTERED
1.0000 | Freq: Once | INTRAVENOUS | Status: AC | PRN
  Administered 2018-04-27: 1 via INTRADERMAL

## 2018-04-27 MED ORDER — ONDANSETRON HCL 4 MG/2ML IJ SOLN
INTRAMUSCULAR | Status: DC | PRN
  Administered 2018-04-27: 4 mg via INTRAVENOUS

## 2018-04-27 MED ORDER — SCOPOLAMINE 1 MG/3DAYS TD PT72: 1.0000 | MEDICATED_PATCH | Freq: Once | TRANSDERMAL | Status: DC | PRN

## 2018-04-27 MED ORDER — EPHEDRINE 5 MG/ML INJ
INTRAVENOUS | Status: AC
  Filled 2018-04-27: qty 10

## 2018-04-27 MED ORDER — OXYCODONE HCL 5 MG PO TABS: 5.0000 mg | ORAL_TABLET | Freq: Once | ORAL | Status: DC | PRN

## 2018-04-27 MED ORDER — GABAPENTIN 100 MG PO CAPS
ORAL_CAPSULE | ORAL | Status: AC
  Filled 2018-04-27: qty 1

## 2018-04-27 MED ORDER — ACETAMINOPHEN 500 MG PO TABS
1000.0000 mg | ORAL_TABLET | ORAL | Status: AC
  Administered 2018-04-27: 1000 mg via ORAL

## 2018-04-27 MED ORDER — ROPIVACAINE HCL 5 MG/ML IJ SOLN
INTRAMUSCULAR | Status: DC | PRN
  Administered 2018-04-27: 30 mL via PERINEURAL

## 2018-04-27 MED ORDER — SODIUM CHLORIDE 0.9 % IJ SOLN
INTRAMUSCULAR | Status: AC
  Filled 2018-04-27: qty 10

## 2018-04-27 MED ORDER — LIDOCAINE HCL (CARDIAC) PF 100 MG/5ML IV SOSY
PREFILLED_SYRINGE | INTRAVENOUS | Status: DC | PRN
  Administered 2018-04-27: 30 mg via INTRAVENOUS

## 2018-04-27 MED ORDER — ACETAMINOPHEN 500 MG PO TABS
ORAL_TABLET | ORAL | Status: AC
  Filled 2018-04-27: qty 2

## 2018-04-27 MED ORDER — ONDANSETRON HCL 4 MG/2ML IJ SOLN: 4.0000 mg | Freq: Once | INTRAMUSCULAR | Status: DC | PRN

## 2018-04-27 MED ORDER — KETOROLAC TROMETHAMINE 15 MG/ML IJ SOLN
15.0000 mg | INTRAMUSCULAR | Status: AC
  Administered 2018-04-27: 15 mg via INTRAVENOUS

## 2018-04-27 MED ORDER — CIPROFLOXACIN IN D5W 400 MG/200ML IV SOLN
400.0000 mg | INTRAVENOUS | Status: AC
  Administered 2018-04-27: 400 mg via INTRAVENOUS

## 2018-04-27 MED ORDER — LACTATED RINGERS IV SOLN
INTRAVENOUS | Status: DC
  Administered 2018-04-27 (×2): via INTRAVENOUS

## 2018-04-27 MED ORDER — KETOROLAC TROMETHAMINE 15 MG/ML IJ SOLN
INTRAMUSCULAR | Status: AC
  Filled 2018-04-27: qty 1

## 2018-04-27 MED ORDER — MIDAZOLAM HCL 2 MG/2ML IJ SOLN
1.0000 mg | INTRAMUSCULAR | Status: DC | PRN
  Administered 2018-04-27: 2 mg via INTRAVENOUS

## 2018-04-27 SURGICAL SUPPLY — 61 items
APPLIER CLIP 9.375 MED OPEN (MISCELLANEOUS)
BINDER BREAST LRG (GAUZE/BANDAGES/DRESSINGS) IMPLANT
BINDER BREAST MEDIUM (GAUZE/BANDAGES/DRESSINGS) IMPLANT
BINDER BREAST XLRG (GAUZE/BANDAGES/DRESSINGS) IMPLANT
BINDER BREAST XXLRG (GAUZE/BANDAGES/DRESSINGS) ×2 IMPLANT
BLADE SURG 15 STRL LF DISP TIS (BLADE) ×1 IMPLANT
BLADE SURG 15 STRL SS (BLADE) ×1
CANISTER SUC SOCK COL 7IN (MISCELLANEOUS) IMPLANT
CANISTER SUCT 1200ML W/VALVE (MISCELLANEOUS) IMPLANT
CHLORAPREP W/TINT 26ML (MISCELLANEOUS) ×4 IMPLANT
CLIP APPLIE 9.375 MED OPEN (MISCELLANEOUS) IMPLANT
CLIP VESOCCLUDE SM WIDE 6/CT (CLIP) ×2 IMPLANT
COVER BACK TABLE 60X90IN (DRAPES) ×2 IMPLANT
COVER MAYO STAND STRL (DRAPES) ×2 IMPLANT
COVER PROBE W GEL 5X96 (DRAPES) ×2 IMPLANT
COVER WAND RF STERILE (DRAPES) IMPLANT
DECANTER SPIKE VIAL GLASS SM (MISCELLANEOUS) IMPLANT
DERMABOND ADVANCED (GAUZE/BANDAGES/DRESSINGS) ×1
DERMABOND ADVANCED .7 DNX12 (GAUZE/BANDAGES/DRESSINGS) ×1 IMPLANT
DEVICE DUBIN W/COMP PLATE 8390 (MISCELLANEOUS) ×2 IMPLANT
DRAPE LAPAROSCOPIC ABDOMINAL (DRAPES) ×2 IMPLANT
DRAPE UTILITY XL STRL (DRAPES) IMPLANT
ELECT COATED BLADE 2.86 ST (ELECTRODE) ×2 IMPLANT
ELECT REM PT RETURN 9FT ADLT (ELECTROSURGICAL) ×2
ELECTRODE REM PT RTRN 9FT ADLT (ELECTROSURGICAL) ×1 IMPLANT
GLOVE BIO SURGEON STRL SZ 6.5 (GLOVE) ×4 IMPLANT
GLOVE BIO SURGEON STRL SZ7 (GLOVE) ×4 IMPLANT
GLOVE BIOGEL PI IND STRL 7.0 (GLOVE) ×3 IMPLANT
GLOVE BIOGEL PI IND STRL 7.5 (GLOVE) ×1 IMPLANT
GLOVE BIOGEL PI INDICATOR 7.0 (GLOVE) ×3
GLOVE BIOGEL PI INDICATOR 7.5 (GLOVE) ×1
GLOVE ECLIPSE 6.5 STRL STRAW (GLOVE) ×2 IMPLANT
GOWN STRL REUS W/ TWL LRG LVL3 (GOWN DISPOSABLE) ×3 IMPLANT
GOWN STRL REUS W/TWL LRG LVL3 (GOWN DISPOSABLE) ×3
HEMOSTAT ARISTA ABSORB 3G PWDR (MISCELLANEOUS) IMPLANT
ILLUMINATOR WAVEGUIDE N/F (MISCELLANEOUS) IMPLANT
KIT MARKER MARGIN INK (KITS) ×2 IMPLANT
LIGHT WAVEGUIDE WIDE FLAT (MISCELLANEOUS) IMPLANT
NDL SAFETY ECLIPSE 18X1.5 (NEEDLE) IMPLANT
NEEDLE HYPO 18GX1.5 SHARP (NEEDLE)
NEEDLE HYPO 25X1 1.5 SAFETY (NEEDLE) ×2 IMPLANT
NS IRRIG 1000ML POUR BTL (IV SOLUTION) IMPLANT
PACK BASIN DAY SURGERY FS (CUSTOM PROCEDURE TRAY) ×2 IMPLANT
PENCIL BUTTON HOLSTER BLD 10FT (ELECTRODE) ×2 IMPLANT
SLEEVE SCD COMPRESS KNEE MED (MISCELLANEOUS) ×2 IMPLANT
SPONGE LAP 4X18 RFD (DISPOSABLE) ×4 IMPLANT
STRIP CLOSURE SKIN 1/2X4 (GAUZE/BANDAGES/DRESSINGS) ×2 IMPLANT
SUT ETHILON 2 0 FS 18 (SUTURE) IMPLANT
SUT MNCRL AB 4-0 PS2 18 (SUTURE) ×2 IMPLANT
SUT MON AB 5-0 PS2 18 (SUTURE) ×2 IMPLANT
SUT SILK 2 0 SH (SUTURE) IMPLANT
SUT VIC AB 2-0 SH 27 (SUTURE) ×2
SUT VIC AB 2-0 SH 27XBRD (SUTURE) ×2 IMPLANT
SUT VIC AB 3-0 SH 27 (SUTURE) ×1
SUT VIC AB 3-0 SH 27X BRD (SUTURE) ×1 IMPLANT
SUT VIC AB 5-0 PS2 18 (SUTURE) IMPLANT
SYR CONTROL 10ML LL (SYRINGE) ×2 IMPLANT
TOWEL GREEN STERILE FF (TOWEL DISPOSABLE) ×2 IMPLANT
TOWEL OR NON WOVEN STRL DISP B (DISPOSABLE) IMPLANT
TUBE CONNECTING 20X1/4 (TUBING) IMPLANT
YANKAUER SUCT BULB TIP NO VENT (SUCTIONS) IMPLANT

## 2018-04-27 NOTE — Anesthesia Procedure Notes (Signed)
Procedure Name: LMA Insertion Date/Time: 04/27/2018 2:25 PM Performed by: Signe Colt, CRNA Pre-anesthesia Checklist: Patient identified, Emergency Drugs available, Suction available and Patient being monitored Patient Re-evaluated:Patient Re-evaluated prior to induction Oxygen Delivery Method: Circle system utilized Preoxygenation: Pre-oxygenation with 100% oxygen Induction Type: IV induction Ventilation: Mask ventilation without difficulty LMA: LMA inserted LMA Size: 4.0 Number of attempts: 1 Airway Equipment and Method: Bite block Placement Confirmation: positive ETCO2 Tube secured with: Tape Dental Injury: Teeth and Oropharynx as per pre-operative assessment

## 2018-04-27 NOTE — H&P (Signed)
67 yof referred by Dr Melanee Spry for new right breast cancer. she has no family history. she has history of cysts but otherwise nothing concerning. she has no mass or dc. she had mm in follow up for distortion with a right breast mass. there is central breast mass that on [place] is 5x4x4 mm mass and [place] of axilla is negative. biopsy was done and is grade I IDC that is er/pr pos, her 2 negative and Ki is 3%. she is retired (does some part time drafting now)   Past Surgical History Sabino Gasser; 04/15/2018 10:28 AM) Hysterectomy (not due to cancer) - Complete  Tonsillectomy   Diagnostic Studies History Sabino Gasser; 04/15/2018 10:28 AM) Mammogram  within last year Pap Smear  >5 years ago  Allergies Sabino Gasser; 04/15/2018 10:28 AM) Keflex *CEPHALOSPORINS*   Medication History Sabino Gasser; 04/15/2018 10:29 AM) Levothyroxine Sodium (125MCG Tablet, Oral) Active. Lisinopril-hydroCHLOROthiazide (20-25MG Tablet, Oral) Active. Medications Reconciled  Social History Sabino Gasser; 04/15/2018 10:28 AM) Caffeine use  Coffee. No drug use  Tobacco use  Former smoker.  Family History Sabino Gasser; 04/15/2018 10:28 AM) Hypertension  Father. Respiratory Condition  Father. Thyroid problems  Father.  Pregnancy / Birth History Sabino Gasser; 04/15/2018 10:28 AM) Age at menarche  42 years. Age of menopause  94-50 Contraceptive History  Oral contraceptives. Gravida  2 Maternal age  67-30 Para  2  Other Problems Sabino Gasser; 04/15/2018 10:28 AM) Cholelithiasis  High blood pressure  Thyroid Disease     Review of Systems Sabino Gasser; 04/15/2018 10:28 AM) General Not Present- Appetite Loss, Chills, Fatigue, Fever, Night Sweats, Weight Gain and Weight Loss. HEENT Not Present- Earache, Hearing Loss, Hoarseness, Nose Bleed, Oral Ulcers, Ringing in the Ears, Seasonal Allergies, Sinus Pain, Sore Throat, Visual Disturbances, Wears glasses/contact lenses and Yellow  Eyes. Breast Not Present- Breast Mass, Breast Pain, Nipple Discharge and Skin Changes. Psychiatric Not Present- Anxiety, Bipolar, Change in Sleep Pattern, Depression, Fearful and Frequent crying. Endocrine Present- Hot flashes. Not Present- Cold Intolerance, Excessive Hunger, Hair Changes, Heat Intolerance and New Diabetes. Hematology Not Present- Blood Thinners, Easy Bruising, Excessive bleeding, Gland problems, HIV and Persistent Infections.  Vitals Sabino Gasser; 04/15/2018 10:30 AM) 04/15/2018 10:29 AM Weight: 197.38 lb Height: 65in Body Surface Area: 1.97 m Body Mass Index: 32.84 kg/m  Temp.: 84F(Oral)  Pulse: 80 (Regular)  BP: 130/82 (Sitting, Left Arm, Standard)       Physical Exam Rolm Bookbinder MD; 04/15/2018 10:51 AM) General Mental Status-Alert.  Head and Neck Trachea-midline. Thyroid Gland Characteristics - normal size and consistency.  Eye Sclera/Conjunctiva - Bilateral-No scleral icterus.  Chest and Lung Exam Chest and lung exam reveals -quiet, even and easy respiratory effort with no use of accessory muscles and on auscultation, normal breath sounds, no adventitious sounds and normal vocal resonance.  Breast Nipples-No Discharge. Breast Lump-No Palpable Breast Mass. Note: hematoma central right breast   Cardiovascular Cardiovascular examination reveals -normal heart sounds, regular rate and rhythm with no murmurs.  Abdomen Note: soft nontender no hm   Neurologic Neurologic evaluation reveals -alert and oriented x 3 with no impairment of recent or remote memory.  Lymphatic Head & Neck  General Head & Neck Lymphatics: Bilateral - Description - Normal. Axillary  General Axillary Region: Bilateral - Description - Normal. Note: no Wynne adenopathy     Assessment & Plan Rolm Bookbinder MD; 04/15/2018 11:21 AM) CANCER OF CENTRAL PORTION OF RIGHT BREAST (C50.111) Story: Right breast seed guided lumpectomy, right  ax sn biopsy, refer rad  onc and Dr Jana Hakim We discussed the staging and pathophysiology of breast cancer. We discussed all of the different options for treatment for breast cancer including surgery, chemotherapy, radiation therapy, Herceptin, and antiestrogen therapy. We discussed a sentinel lymph node biopsy as she does not appear to having lymph node involvement right now. We discussed the performance of that with injection of radioactive tracer. We discussed that there is a chance of having a positive node with a sentinel lymph node biopsy and we will await the permanent pathology to make any other first further decisions in terms of her treatment. One of these options might be to return to the operating room to perform an axillary lymph node dissection. We discussed up to a 5% risk lifetime of chronic shoulder pain as well as lymphedema associated with a sentinel lymph node biopsy. We discussed the options for treatment of the breast cancer which included lumpectomy versus a mastectomy. We discussed the performance of the lumpectomy with radioactive seed placement. We discussed a 5% chance of a positive margin requiring reexcision in the operating room. We also discussed that she will need radiation therapy if she undergoes lumpectomy. We discussed the mastectomy and the postoperative care for that as well. Mastectomy can be followed by reconstruction. The decision for lumpectomy vs mastectomy has no impact on decision for chemotherapy. Most mastectomy patients will not need radiation therapy. We discussed that there is no difference in her survival whether she undergoes lumpectomy with radiation therapy or antiestrogen therapy versus a mastectomy. There is also no real difference between her recurrence in the breast. We discussed the risks of operation including bleeding, infection, possible reoperation. She understands her further therapy will be based on what her stages at the time of her operation.

## 2018-04-27 NOTE — Anesthesia Preprocedure Evaluation (Addendum)
Anesthesia Evaluation  Patient identified by MRN, date of birth, ID band Patient awake    Reviewed: Allergy & Precautions, NPO status , Patient's Chart, lab work & pertinent test results  History of Anesthesia Complications (+) PONVNegative for: history of anesthetic complications  Airway Mallampati: II  TM Distance: >3 FB Neck ROM: Full    Dental no notable dental hx.    Pulmonary neg pulmonary ROS, former smoker,    Pulmonary exam normal        Cardiovascular hypertension, Normal cardiovascular exam     Neuro/Psych negative neurological ROS  negative psych ROS   GI/Hepatic Neg liver ROS, Celiac disease   Endo/Other  Hypothyroidism   Renal/GU negative Renal ROS  negative genitourinary   Musculoskeletal negative musculoskeletal ROS (+)   Abdominal   Peds  Hematology negative hematology ROS (+)   Anesthesia Other Findings   Reproductive/Obstetrics                            Anesthesia Physical Anesthesia Plan  ASA: II  Anesthesia Plan: General   Post-op Pain Management: GA combined w/ Regional for post-op pain   Induction: Intravenous  PONV Risk Score and Plan: 4 or greater and Ondansetron, Dexamethasone, Midazolam and Treatment may vary due to age or medical condition  Airway Management Planned: LMA  Additional Equipment: None  Intra-op Plan:   Post-operative Plan: Extubation in OR  Informed Consent: I have reviewed the patients History and Physical, chart, labs and discussed the procedure including the risks, benefits and alternatives for the proposed anesthesia with the patient or authorized representative who has indicated his/her understanding and acceptance.     Plan Discussed with:   Anesthesia Plan Comments:        Anesthesia Quick Evaluation

## 2018-04-27 NOTE — Transfer of Care (Signed)
Immediate Anesthesia Transfer of Care Note  Patient: Christine Leon  Procedure(s) Performed: RIGHT BREAST LUMPECTOMY WITH RADIOACTIVE SEED AND RIGHT AXILLARYSENTINEL LYMPH NODE BIOPSY ERAS PATHWAY (Right )  Patient Location: PACU  Anesthesia Type:GA combined with regional for post-op pain  Level of Consciousness: awake, alert , oriented and patient cooperative  Airway & Oxygen Therapy: Patient Spontanous Breathing and Patient connected to face mask oxygen  Post-op Assessment: Report given to RN and Post -op Vital signs reviewed and stable  Post vital signs: Reviewed and stable  Last Vitals:  Vitals Value Taken Time  BP    Temp    Pulse 88 04/27/2018  3:33 PM  Resp 12 04/27/2018  3:33 PM  SpO2 100 % 04/27/2018  3:33 PM  Vitals shown include unvalidated device data.  Last Pain:  Vitals:   04/27/18 1305  TempSrc: Oral  PainSc: 0-No pain         Complications: No apparent anesthesia complications

## 2018-04-27 NOTE — Anesthesia Procedure Notes (Signed)
Anesthesia Regional Block: Pectoralis block   Pre-Anesthetic Checklist: ,, timeout performed, Correct Patient, Correct Site, Correct Laterality, Correct Procedure, Correct Position, site marked, Risks and benefits discussed,  Surgical consent,  Pre-op evaluation,  At surgeon's request and post-op pain management  Laterality: Right  Prep: chloraprep       Needles:  Injection technique: Single-shot  Needle Type: Echogenic Stimulator Needle     Needle Length: 10cm  Needle Gauge: 21     Additional Needles:   Procedures:,,,, ultrasound used (permanent image in chart),,,,  Narrative:  Start time: 04/27/2018 1:40 PM End time: 04/27/2018 1:48 PM Injection made incrementally with aspirations every 5 mL.  Performed by: Personally  Anesthesiologist: Lidia Collum, MD  Additional Notes: Monitors applied. Injection made in 5cc increments. No resistance to injection. Good needle visualization. Patient tolerated procedure well.

## 2018-04-27 NOTE — Progress Notes (Signed)
Assisted Dr. Christella Hartigan with right, ultrasound guided, pectoralis block. Side rails up, monitors on throughout procedure. See vital signs in flow sheet. Tolerated Procedure well.

## 2018-04-27 NOTE — Anesthesia Postprocedure Evaluation (Signed)
Anesthesia Post Note  Patient: Kendy Haston Lastra  Procedure(s) Performed: RIGHT BREAST LUMPECTOMY WITH RADIOACTIVE SEED AND RIGHT AXILLARYSENTINEL LYMPH NODE BIOPSY ERAS PATHWAY (Right )     Patient location during evaluation: PACU Anesthesia Type: General Level of consciousness: awake and alert Pain management: pain level controlled Vital Signs Assessment: post-procedure vital signs reviewed and stable Respiratory status: spontaneous breathing, nonlabored ventilation, respiratory function stable and patient connected to nasal cannula oxygen Cardiovascular status: blood pressure returned to baseline and stable Postop Assessment: no apparent nausea or vomiting Anesthetic complications: no    Last Vitals:  Vitals:   04/27/18 1615 04/27/18 1630  BP: (!) 155/75 (!) 155/83  Pulse: 80 80  Resp: 14 15  Temp:    SpO2: 93% 93%    Last Pain:  Vitals:   04/27/18 1630  TempSrc:   PainSc: 3                  Lidia Collum

## 2018-04-27 NOTE — Discharge Instructions (Signed)
[facility] Office Phone Number [phone]  POST OP INSTRUCTIONS Take 400 mg of ibuprofen every 8 hours or 650 mg tylenol every 6 hours for next 72 hours then as needed. Use ice several times daily also. Always review your discharge instruction sheet given to you by the facility where your surgery was performed.  IF YOU HAVE DISABILITY OR FAMILY LEAVE FORMS, YOU MUST BRING THEM TO THE OFFICE FOR PROCESSING.  DO NOT GIVE THEM TO YOUR DOCTOR.  1. A prescription for pain medication may be given to you upon discharge.  Take your pain medication as prescribed, if needed.  If narcotic pain medicine is not needed, then you may take acetaminophen (Tylenol), naprosyn (Alleve) or ibuprofen (Advil) as needed. *1,041m Tylenol taken at 12:38* No Ibuprofen until 8:15 if needed* 2. Take your usually prescribed medications unless otherwise directed 3. If you need a refill on your pain medication, please contact your pharmacy.  They will contact our office to request authorization.  Prescriptions will not be filled after 5pm or on week-ends. 4. You should eat very light the first 24 hours after surgery, such as soup, crackers, pudding, etc.  Resume your normal diet the day after surgery. 5. Most patients will experience some swelling and bruising in the breast.  Ice packs and a good support bra will help.  Wear the breast binder provided or a sports bra for 72 hours day and night.  After that wear a sports bra during the day until you return to the office. Swelling and bruising can take several days to resolve.  6. It is common to experience some constipation if taking pain medication after surgery.  Increasing fluid intake and taking a stool softener will usually help or prevent this problem from occurring.  A mild laxative (Milk of Magnesia or Miralax) should be taken according to package directions if there are no bowel movements after 48 hours. 7. Unless discharge instructions indicate otherwise,  you may remove your bandages 48 hours after surgery and you may shower at that time.  You may have steri-strips (small skin tapes) in place directly over the incision.  These strips should be left on the skin for 7-10 days and will come off on their own.  If your surgeon used skin glue on the incision, you may shower in 24 hours.  The glue will flake off over the next 2-3 weeks.  Any sutures or staples will be removed at the office during your follow-up visit. 8. ACTIVITIES:  You may resume regular daily activities (gradually increasing) beginning the next day.  Wearing a good support bra or sports bra minimizes pain and swelling.  You may have sexual intercourse when it is comfortable. a. You may drive when you no longer are taking prescription pain medication, you can comfortably wear a seatbelt, and you can safely maneuver your car and apply brakes. b. RETURN TO WORK:  ______________________________________________________________________________________ 9. You should see your doctor in the office for a follow-up appointment approximately two weeks after your surgery.  Your doctors nurse will typically make your follow-up appointment when she calls you with your pathology report.  Expect your pathology report 3-4 business days after your surgery.  You may call to check if you do not hear from [place] three days. 10. OTHER INSTRUCTIONS: _______________________________________________________________________________________________ _____________________________________________________________________________________________________________________________________ _____________________________________________________________________________________________________________________________________ _____________________________________________________________________________________________________________________________________  WHEN TO CALL DR WAKEFIELD: 1. Fever over 101.0 2. Nausea and/or  vomiting. 3. Extreme swelling or bruising. 4. Continued bleeding from incision. 5. Increased pain, redness, or  drainage from the incision.  The clinic staff is available to answer your questions during regular business hours.  Please dont hesitate to call and ask to speak to one of the nurses for clinical concerns.  If you have a medical emergency, go to the nearest emergency room or call 911.  A surgeon from [facility] is always on call at the hospital.  For further questions, please visit [email or web address]   Post Anesthesia Home Care Instructions  Activity: Get plenty of rest for the remainder of the day. A responsible individual must stay with you for 24 hours following the procedure.  For the next 24 hours, DO NOT: -Drive a car -Paediatric nurse -Drink alcoholic beverages -Take any medication unless instructed by your physician -Make any legal decisions or sign important papers.  Meals: Start with liquid foods such as gelatin or soup. Progress to regular foods as tolerated. Avoid greasy, spicy, heavy foods. If nausea and/or vomiting occur, drink only clear liquids until the nausea and/or vomiting subsides. Call your physician if vomiting continues.  Special Instructions/Symptoms: Your throat may feel dry or sore from the anesthesia or the breathing tube placed in your throat during surgery. If this causes discomfort, gargle with warm salt water. The discomfort should disappear within 24 hours.  If you had a scopolamine patch placed behind your ear for the management of post- operative nausea and/or vomiting:  1. The medication in the patch is effective for 72 hours, after which it should be removed.  Wrap patch in a tissue and discard in the trash. Wash hands thoroughly with soap and water. 2. You may remove the patch earlier than 72 hours if you experience unpleasant side effects which may include dry mouth, dizziness or visual disturbances. 3. Avoid  touching the patch. Wash your hands with soap and water after contact with the patch.

## 2018-04-27 NOTE — Op Note (Signed)
Preoperative diagnosis: Clinical stage I right breast cancer Postoperative diagnosis: Same as above Procedure: 1. Right breast seed guided lumpectomy 2. Right deep axillary sentinel lymph node biopsy Surgeon: Dr. Serita Grammes Anesthesia: General with a pectoral block Estimated blood loss: Less than 30 cc Specimens: 1. Right breast tissue marked with paint 2.  Right breast axillary sentinel lymph nodes with highest count of 539 Complications: None Drains: None Sponge needle count was correct at completion Disposition to recovery in stable condition  Indications: This is a healthy 67 year old female who is diagnosed on screening mammography with a clinical stage I right breast cancer. We discussed all of her options and elected to proceed with a lumpectomy and axillary sentinel lymph node biopsy. She had a seed placed prior to beginning. I had these mammograms available in the operating room.  Procedure: After informed consent was obtained the patient first underwent a pectoral block. She underwent injection of technetium in the standard periareolar fashion. She was administered antibiotics. SCDs were in place. She was then placed under general anesthesia without complication. She was prepped and draped in the standard sterile surgical fashion. A surgical timeout was then performed.  I located the radioactive seed in the upper central right breast. I infiltrated Marcaine around the areola. I made a periareolar incision in order to hide the scar later. I then tunneled to the lesion using the neoprobe's guidance. I then used the neoprobe to guide the excision of the seed and the surrounding tissue with an attempt to get a clear margin. Marland Kitchen Hemostasis was obtained. I placed some clips in the cavity. I sutured this closed with 2-0 Vicryl. I then closed the skin with 3-0 Vicryl and 5-0 Monocryl. Glue and Steri-Strips were applied.  I then located the sentinel nodes in the low  axilla. I made an incision after infiltrating Marcaine at the axillary hairline. I dissected through the axillary fascia. I then identified what appeared to be several sentinel lymph nodes. The highest count as listed above. There was no background radioactivity once I was completed. There were no palpable lymph nodes either. I then obtained hemostasis. I closed down the axillary fascia with a 2-0 Vicryl. The remaining tissue was closed with 3-0 Vicryl and 4-0 Monocryl. Glue and Steri-Strips were applied as well. She tolerated this well was extubated and transferred to the recovery room stable.

## 2018-04-28 ENCOUNTER — Encounter (HOSPITAL_BASED_OUTPATIENT_CLINIC_OR_DEPARTMENT_OTHER): Payer: Self-pay | Admitting: General Surgery

## 2018-05-02 ENCOUNTER — Telehealth: Payer: Self-pay | Admitting: *Deleted

## 2018-05-02 NOTE — Telephone Encounter (Signed)
Received order for oncotype testing. Requisition faxed to pathology. Received by Keisha 

## 2018-05-05 NOTE — Progress Notes (Signed)
Location of Breast Cancer: Right Breast  Histology per Pathology Report:  03/30/18 Diagnosis Breast, right, needle core biopsy, upper, 12 o'clock position - INVASIVE DUCTAL CARCINOMA.  Receptor Status: ER(100%), PR (80%), Her2-neu (NEG), Ki-(3%)  04/27/18 Diagnosis 1. Breast, lumpectomy, right with radioactive seed - INVASIVE DUCTAL CARCINOMA, 1.6 CM, NOTTINGHAM GRADE I OF III. - MARGINS OF RESECTION ARE NOT INVOLVED (CLOSEST MARGIN: 5 MM, MEDIAL). - BIOPSY SITE CHANGES. - SEE ONCOLOGY TABLE. 2. Lymph node, sentinel, biopsy, right axillary - ONE LYMPH NODE, NEGATIVE FOR CARCINOMA (0/1).  Did patient present with symptoms or was this found on screening mammography?:  Dr. Virgie Dad note 04/22/18: She had previous mammography in 2016 showing distortion of the right breast with benign findings along with a 16-monthfollow up. She did not have further mammography until she had bilateral diagnostic mammography with tomography and [facility].   Past/Anticipated interventions by surgeon, if any: 04/27/18 Procedure: 1.Rightbreast seed guided lumpectomy 2.Rightdeep axillary sentinel lymph node biopsy Surgeon: Dr. MSerita Grammes Past/Anticipated interventions by medical oncology, if any:  Dr. MJana Hakim10/11/19 (1) definitive surgery pending (2) adjuvant radiation to follow (3) consider antiestrogens at the completion of local treatment  Lymphedema issues, if any:  She denies. She has good arm mobility  Pain issues, if any:  She denies.   SAFETY ISSUES:  Prior radiation? No  Pacemaker/ICD? No  Possible current pregnancy? No  Is the patient on methotrexate? No  Current Complaints / other details:    BP (!) 153/70 (BP Location: Left Arm, Patient Position: Sitting)   Pulse 64   Temp 97.8 F (36.6 C) (Oral)   Resp 20   Ht 5' 5.5" (1.664 m)   Wt 197 lb 12.8 oz (89.7 kg)   LMP  (LMP Unknown)   BMI 32.42 kg/m     Wt Readings from Last 3 Encounters:  05/11/18 197 lb 12.8 oz (89.7 kg)  04/27/18 197 lb 1.5 oz (89.4 kg)  04/22/18 198 lb 14.4 oz (90.2 kg)      Ayse Mccartin, JStephani Police RN 05/05/2018,10:39 AM

## 2018-05-10 ENCOUNTER — Telehealth: Payer: Self-pay | Admitting: *Deleted

## 2018-05-10 NOTE — Telephone Encounter (Signed)
Received oncotype score of 17/5%. Physician team notified. Called pt with results. Left vm for return call to discuss chemo not recommended. Contact information provided.

## 2018-05-10 NOTE — Telephone Encounter (Signed)
Discussed Oncotype results of 17. Informed pt does not need chemo. Next step xrt with Dr. Isidore Moos. Received verbal understanding

## 2018-05-11 ENCOUNTER — Encounter: Payer: Self-pay | Admitting: Radiation Oncology

## 2018-05-11 ENCOUNTER — Other Ambulatory Visit: Payer: Self-pay

## 2018-05-11 ENCOUNTER — Ambulatory Visit
Admission: RE | Admit: 2018-05-11 | Discharge: 2018-05-11 | Disposition: A | Discharge status: Home / Self Care | Payer: Medicare HMO | Source: Ambulatory Visit | Attending: Radiation Oncology | Admitting: Radiation Oncology

## 2018-05-11 VITALS — BP 153/70 | HR 64 | Temp 97.8°F | Resp 20 | Ht 65.5 in | Wt 197.8 lb

## 2018-05-11 DIAGNOSIS — Z87891 Personal history of nicotine dependence: Secondary | ICD-10-CM | POA: Insufficient documentation

## 2018-05-11 DIAGNOSIS — Z170 Estrogen receptor positive status [ER+]: Secondary | ICD-10-CM | POA: Insufficient documentation

## 2018-05-11 DIAGNOSIS — E039 Hypothyroidism, unspecified: Secondary | ICD-10-CM | POA: Insufficient documentation

## 2018-05-11 DIAGNOSIS — C50411 Malignant neoplasm of upper-outer quadrant of right female breast: Secondary | ICD-10-CM

## 2018-05-11 DIAGNOSIS — Z79899 Other long term (current) drug therapy: Secondary | ICD-10-CM | POA: Diagnosis not present

## 2018-05-11 DIAGNOSIS — Z881 Allergy status to other antibiotic agents status: Secondary | ICD-10-CM | POA: Insufficient documentation

## 2018-05-11 DIAGNOSIS — Z803 Family history of malignant neoplasm of breast: Secondary | ICD-10-CM | POA: Diagnosis not present

## 2018-05-11 DIAGNOSIS — Z9889 Other specified postprocedural states: Secondary | ICD-10-CM | POA: Diagnosis not present

## 2018-05-11 NOTE — Progress Notes (Signed)
Radiation Oncology         [phone] ________________________________  Initial outpatient Consultation  Name: Christine Leon [facility] MRN: [id number]  Date: 05/11/2018  DOB: 04-23-1951  QI:[id number], [name], MD  Rolm Bookbinder, MD   REFERRING PHYSICIAN: Rolm Bookbinder, MD  DIAGNOSIS:    ICD-10-CM   1. Malignant neoplasm of upper-outer quadrant of right breast in female, estrogen receptor positive ([place]) C50.411 Ambulatory referral to Social Work   Z17.0    Cancer Staging Malignant neoplasm of upper-outer quadrant of right breast in female, estrogen receptor positive ([place]) Staging form: Breast, AJCC 8th Edition - Clinical: Stage IA (cT1c, cN0, cM0, G1, ER+, PR+, HER2-) - Signed by Eppie Gibson, MD on 05/11/2018 - Pathologic: Stage IA (pT1c, pN0, cM0, G1, ER+, PR+, HER2-) - Signed by Eppie Gibson, MD on 05/13/2018   CHIEF COMPLAINT: Here to discuss management of right breast cancer  HISTORY OF PRESENT ILLNESS::Christine Leon is a 67 y.o. female who presented with right breast abnormality on the following imaging: tomo mammography on the date of 03-29-18.  This was a delayed screening recall for a right breast distortion.  Ultrasound of breast on same date revealed a 78m suspicious mass at 12:00 of right breast.  Axilla clear on [place] Biopsy on date of 03-30-18 showed invasive ductal carcinoma.  ER status: +; PR status +, Her2 status neg.  On 04-27-18 she underwent lumpectomy and SLN biopsy.  This revealed a 1.6cm invasive ductal carcinoma, grade I, margins negative by 561m 0/1 SLN involved.  She is doing well.  No issues with lymphedema or arm mobility.  No pain.  PREVIOUS RADIATION THERAPY: No  PAST MEDICAL HISTORY:  has a past medical history of Allergy, Celiac sprue, Hypertension, Hypothyroidism, and PONV (postoperative nausea and vomiting).    PAST SURGICAL HISTORY: Past Surgical History:  Procedure Laterality Date  . ABDOMINAL HYSTERECTOMY     in 5079s fibroids. including cervix removal. per patient was told no further pap smears after.   . Marland Kitchenrthroscopic knee surgery     2015 under Dr. CoTheda Sers. BREAST LUMPECTOMY WITH RADIOACTIVE SEED AND SENTINEL LYMPH NODE BIOPSY Right 04/27/2018   Procedure: RIGHT BREAST LUMPECTOMY WITH RADIOACTIVE SEED AND RIGHT AXILLARYSENTINEL LYMPH NODE BIOPSY ERAS PATHWAY;  Surgeon: WaRolm BookbinderMD;  [facility];  Laterality: Right;  PECTORAL BLOCK  . CHOLECYSTECTOMY    . COCCYX REMOVAL     after cheerleading accident  . OOPHORECTOMY     in 5061s. TONSILLECTOMY      FAMILY HISTORY: family history includes Breast cancer in her maternal aunt; COPD in her father; Coronary artery disease in her father; Hypertension in her mother.  SOCIAL HISTORY:  reports that she quit smoking about 11 years ago. Her smoking use included cigarettes. She has a 39.00 pack-year smoking history. She has never used smokeless tobacco. She reports that she drinks about 1.0 standard drinks of alcohol per week. She reports that she does not use drugs.  ALLERGIES: Gluten meal; Wheat bran; and Cephalexin  MEDICATIONS:  Current Outpatient Medications  Medication Sig Dispense Refill  . ALPHA LIPOIC ACID PO Take 1 tablet by mouth daily.    . Cholecalciferol (VITAMIN D-3 PO) Take 1,500 Units by mouth daily.    . Marland Kitchenscitalopram (LEXAPRO) 5 MG tablet Take 1 tablet (5 mg total) by mouth daily. 30 tablet 5  . fish oil-omega-3 fatty acids 1000 MG capsule Take 1 g by mouth daily.      .Marland Kitchen  fluticasone (FLONASE) 50 MCG/ACT nasal spray Place 2 sprays into both nostrils daily. 16 g 6  . glucosamine-chondroitin 500-400 MG tablet Take 1 tablet by mouth daily.      Marland Kitchen levothyroxine (SYNTHROID, LEVOTHROID) 125 MCG tablet Take 125 mcg by mouth daily before breakfast.    . lisinopril-hydrochlorothiazide (PRINZIDE,ZESTORETIC) 20-25 MG tablet TAKE ONE TABLET BY MOUTH ONCE DAILY 90 tablet 3  . vitamin B-12  (CYANOCOBALAMIN) 500 MCG tablet Take 500 mcg by mouth daily.    Marland Kitchen oxyCODONE (OXY IR/ROXICODONE) 5 MG immediate release tablet Take 1 tablet (5 mg total) by mouth every 6 (six) hours as needed for moderate pain, severe pain or breakthrough pain. 10 tablet 0   No current facility-administered medications for this encounter.     REVIEW OF SYSTEMS: A 10+ POINT REVIEW OF SYSTEMS WAS OBTAINED including neurology, dermatology, psychiatry, cardiac, respiratory, lymph, extremities, GI, GU, Musculoskeletal, constitutional, breasts, reproductive, HEENT.  All pertinent positives are noted in the HPI.  All others are negative.   PHYSICAL EXAM:  height is 5' 5.5" (1.664 m) and weight is 197 lb 12.8 oz (89.7 kg). Her oral temperature is 97.8 F (36.6 C). Her blood pressure is 153/70 (abnormal) and her pulse is 64. Her respiration is 20.   General: Alert and oriented, in no acute distress HEENT: Head is normocephalic. Extraocular movements are intact. Oropharynx is clear. Neck: Neck is supple, no palpable cervical or supraclavicular lymphadenopathy. Heart: Regular in rate and rhythm with no murmurs, rubs, or gallops. Chest: Clear to auscultation bilaterally, with no rhonchi, wheezes, or rales. Abdomen: Soft, nontender, nondistended, with no rigidity or guarding. Extremities: No cyanosis or edema. Lymphatics: see Neck Exam Skin: No concerning lesions. Musculoskeletal: symmetric strength and muscle tone throughout. Neurologic: Cranial nerves II through XII are grossly intact. No obvious focalities. Speech is fluent. Coordination is intact. Psychiatric: Judgment and insight are intact. Affect is appropriate. Breasts: healing well from right breast surgery, scars intact . No concerning palpable masses appreciated in the breasts or axillae .  ECOG = 0  0 - Asymptomatic (Fully active, able to carry on all predisease activities without restriction)  1 - Symptomatic but completely ambulatory (Restricted in  physically strenuous activity but ambulatory and able to carry out work of a light or sedentary nature. For example, light housework, office work)  2 - Symptomatic, <50% in bed during the day (Ambulatory and capable of all self care but unable to carry out any work activities. Up and about more than 50% of waking hours)  3 - Symptomatic, >50% in bed, but not bedbound (Capable of only limited self-care, confined to bed or chair 50% or more of waking hours)  4 - Bedbound (Completely disabled. Cannot carry on any self-care. Totally confined to bed or chair)  5 - Death   Eustace Pen MM, Creech RH, Tormey DC, et al. [phone]). "Toxicity and response criteria of the [facility] Group". [place] Oncol. 5 (6): 649-55   LABORATORY DATA:  Lab Results  Component Value Date   WBC 4.5 04/15/2017   HGB 14.7 04/15/2017   HCT 44.3 04/15/2017   MCV 91.2 04/15/2017   PLT 271.0 04/15/2017   CMP     Component Value Date/Time   NA 142 04/22/2018 1300   K 3.5 04/22/2018 1300   CL 106 04/22/2018 1300   CO2 27 04/22/2018 1300   GLUCOSE 110 (H) 04/22/2018 1300   BUN 13 04/22/2018 1300   CREATININE 0.82 04/22/2018 1300   CALCIUM 9.3  04/22/2018 1300   PROT 7.3 04/15/2017 1039   ALBUMIN 4.6 04/15/2017 1039   AST 32 04/15/2017 1039   ALT 55 (H) 04/15/2017 1039   ALKPHOS 60 04/15/2017 1039   BILITOT 0.8 04/15/2017 1039   GFRNONAA >60 04/22/2018 1300   GFRAA >60 04/22/2018 1300         RADIOGRAPHY: Nm Sentinel Node Inj-no Rpt (breast)  Result Date: 04/27/2018 Sulfur colloid was injected by the nuclear medicine technologist for melanoma sentinel node.   Mm Breast Surgical Specimen  Result Date: 04/27/2018 CLINICAL DATA:  Patient is post radioactive seed localization and subsequent surgical excision of biopsy-proven invasive ductal carcinoma right breast. EXAM: SPECIMEN RADIOGRAPH OF THE RIGHT BREAST COMPARISON:  Previous exam(s). FINDINGS: Status post excision of the right breast. The  radioactive seed and biopsy marker clip are present, completely intact, and were marked for pathology. Findings were called to the OR at the time of dictation. IMPRESSION: Specimen radiograph of the right breast. Electronically Signed   By: Marin Olp M.D.   On: 04/27/2018 15:01   Mm Rt Radioactive Seed Loc Mammo Guide  Result Date: 04/26/2018 CLINICAL DATA:  Preoperative radioactive seed localization prior to right breast lumpectomy. EXAM: MAMMOGRAPHIC GUIDED RADIOACTIVE SEED LOCALIZATION OF THE RIGHT BREAST COMPARISON:  Previous exam(s). FINDINGS: Patient presents for radioactive seed localization prior to right breast lumpectomy. I met with the patient and we discussed the procedure of seed localization including benefits and alternatives. We discussed the high likelihood of a successful procedure. We discussed the risks of the procedure including infection, bleeding, tissue injury and further surgery. We discussed the low dose of radioactivity involved in the procedure. Informed, written consent was given. The usual time-out protocol was performed immediately prior to the procedure. Using mammographic guidance, sterile technique, 1% lidocaine and an I-125 radioactive seed, architectural distortion containing ribbon shaped marker in the right upper central breast was localized using a superior approach. The seed is located 2 mm posterior to the post biopsy marker. The follow-up mammogram images confirm the seed in the expected location and were marked for Dr. Donne Hazel. Follow-up survey of the patient confirms presence of the radioactive seed. Order number of I-125 seed:  [id number]. Total activity: 7.673 millicurie reference Date: 13 of September 2019 The patient tolerated the procedure well and was released from the [place]. She was given instructions regarding seed removal. IMPRESSION: Radioactive seed localization right breast. No apparent complications. Electronically Signed   By: Fidela [place] M.D.   On: 04/26/2018 15:09      IMPRESSION/PLAN: Stage I right breast cancer  It was a pleasure meeting the patient today. We discussed the risks, benefits, and side effects of radiotherapy. I recommend radiotherapy to the right breast to reduce her risk of locoregional recurrence by 2/3.  We discussed that radiation would take approximately 4 weeks to complete and that I would give the patient a few weeks to heal following surgery before starting treatment planning.  We spoke about acute effects including skin irritation and fatigue as well as much less common late effects including internal organ injury or irritation. We spoke about the latest technology that is used to minimize the risk of late effects for patients undergoing radiotherapy to the breast or chest wall. No guarantees of treatment were given. The patient is enthusiastic about proceeding with treatment. I look forward to participating in the patient's care. Simulation will occur in 1 week.     __________________________________________   Eppie Gibson, MD

## 2018-05-13 ENCOUNTER — Encounter: Payer: Self-pay | Admitting: General Practice

## 2018-05-13 ENCOUNTER — Encounter: Payer: Self-pay | Admitting: Radiation Oncology

## 2018-05-13 ENCOUNTER — Encounter (HOSPITAL_COMMUNITY): Payer: Self-pay | Admitting: Oncology

## 2018-05-13 ENCOUNTER — Ambulatory Visit: Payer: Medicare HMO | Attending: General Surgery | Admitting: Rehabilitation

## 2018-05-13 ENCOUNTER — Encounter: Payer: Self-pay | Admitting: Rehabilitation

## 2018-05-13 ENCOUNTER — Other Ambulatory Visit: Payer: Self-pay

## 2018-05-13 DIAGNOSIS — R293 Abnormal posture: Secondary | ICD-10-CM | POA: Diagnosis not present

## 2018-05-13 DIAGNOSIS — Z483 Aftercare following surgery for neoplasm: Secondary | ICD-10-CM | POA: Diagnosis not present

## 2018-05-13 DIAGNOSIS — Z9189 Other specified personal risk factors, not elsewhere classified: Secondary | ICD-10-CM

## 2018-05-13 NOTE — Patient Instructions (Signed)
Stretches per turning point handout part 2: single arm doorway stretch, alt UE/LE lengthening, teapot stretch, and XYW How to look for lymphedema and prevention strategies (LEAP handout) To return after radiation for strengthening education

## 2018-05-13 NOTE — Therapy (Addendum)
[place], [place], [address] Phone: [phone]   Fax:  [phone]  Physical Therapy Evaluation  Patient Details  Name: Christine Leon MRN: [id number] Date of Birth: 20-Nov-1950 Referring Provider (PT): Dr. Donne Hazel   Encounter Date: 05/13/2018  PT End of Session - 05/13/18 1437    Visit Number  1    Number of Visits  4    Date for PT Re-Evaluation  07/08/18    PT Start Time  1100    PT Stop Time  1145    PT Time Calculation (min)  45 min    Activity Tolerance  Patient tolerated treatment well    Behavior During Therapy  [facility] for tasks assessed/performed       Past Medical History:  Diagnosis Date  . Allergy   . Celiac sprue   . Hypertension   . Hypothyroidism   . PONV (postoperative nausea and vomiting)     Past Surgical History:  Procedure Laterality Date  . ABDOMINAL HYSTERECTOMY     in 24s. fibroids. including cervix removal. per patient was told no further pap smears after.   Marland Kitchen arthroscopic knee surgery     2015 under Dr. Theda Sers  . BREAST LUMPECTOMY WITH RADIOACTIVE SEED AND SENTINEL LYMPH NODE BIOPSY Right 04/27/2018   Procedure: RIGHT BREAST LUMPECTOMY WITH RADIOACTIVE SEED AND RIGHT AXILLARYSENTINEL LYMPH NODE BIOPSY ERAS PATHWAY;  Surgeon: Rolm Bookbinder, MD;  Location: [place];  Service: General;  Laterality: Right;  PECTORAL BLOCK  . CHOLECYSTECTOMY    . COCCYX REMOVAL     after cheerleading accident  . OOPHORECTOMY     in 53s  . TONSILLECTOMY      There were no vitals filed for this visit.   Subjective Assessment - 05/13/18 1426    Subjective  Feeling good overall     Pertinent History  Stage I Lt breast cancer ER/PR positive and HER2 negative with lumpectomy on 10/16 by Dr. Donne Hazel and SLNB 0/3 nodes positive.  Will not need chemotherapy and radiation begins next week or soon    Limitations  Other (comment)   has not been back to the gym  yet   Patient Stated Goals  learn what to do for now    Currently in Pain?  No/denies         [facility] PT Assessment - 05/13/18 0001      Assessment   Medical Diagnosis  Lt lumpectomy    Referring Provider (PT)  Dr. Donne Hazel    Onset Date/Surgical Date  04/27/18    Hand Dominance  Right    Next MD Visit  next week    Prior Therapy  no      Precautions   Precaution Comments  cancer, lymphedema      Restrictions   Weight Bearing Restrictions  No      Balance Screen   Has the patient fallen in the past 6 months  No    Has the patient had a decrease in activity level because of a fear of falling?   No    Is the patient reluctant to leave their home because of a fear of falling?   No      Home Environment   Living Environment  Private residence      Prior Function   Vocation  Part time employment    Vocation Requirements  Science writer     Leisure  likes to go to the gym but  is not yet       Cognition   Overall Cognitive Status  Within Functional Limits for tasks assessed      Observation/Other Assessments   Observations  well healed incisions.  Some sponginess to the axillary region on the Rt but also on the Lt.  Pt reports she has breast tissue that has moved inot the axillary region.  Her daughter has this condition as well       Coordination   Gross Motor Movements are Fluid and Coordinated  Yes      Posture/Postural Control   Posture/Postural Control  Postural limitations    Postural Limitations  Rounded Shoulders;Forward head      ROM / Strength   AROM / PROM / Strength  AROM;PROM      AROM   AROM Assessment Site  Shoulder    Right/Left Shoulder  Right;Left    Right Shoulder Flexion  162 Degrees   axillary pull   Right Shoulder ABduction  174 Degrees    Right Shoulder Internal Rotation  80 Degrees    Right Shoulder External Rotation  80 Degrees    Right Shoulder Horizontal ABduction  40 Degrees    Left Shoulder Flexion  162 Degrees    Left  Shoulder ABduction  175 Degrees    Left Shoulder Internal Rotation  80 Degrees    Left Shoulder External Rotation  80 Degrees    Left Shoulder Horizontal ABduction  40 Degrees      PROM   Overall PROM Comments  overall WNL; radiation position fine        LYMPHEDEMA/ONCOLOGY QUESTIONNAIRE - 05/13/18 1107      Type   Cancer Type  Rt breast cancer      Surgeries   Lumpectomy Date  04/27/18    Sentinel Lymph Node Biopsy Date  04/27/18    Number Lymph Nodes Removed  3   all negative     Treatment   Active Chemotherapy Treatment  No    Past Chemotherapy Treatment  No    Active Radiation Treatment  No   starting next week    Past Radiation Treatment  No    Current Hormone Treatment  No   tamoxifen after radiation   Past Hormone Therapy  No      What other symptoms do you have   Are you Having Heaviness or Tightness  No    Are you having Pain  No      Lymphedema Assessments   Lymphedema Assessments  Upper extremities      Right Upper Extremity Lymphedema   10 cm Proximal to Olecranon Process  32.4 cm    Olecranon Process  29 cm    10 cm Proximal to Ulnar Styloid Process  24.5 cm    Just Proximal to Ulnar Styloid Process  18 cm    Across Hand at [employer]  21.5 cm    At [place] of 2nd Digit  6.5 cm      Left Upper Extremity Lymphedema   10 cm Proximal to Olecranon Process  33 cm    Olecranon Process  29 cm    10 cm Proximal to Ulnar Styloid Process  23.5 cm    Just Proximal to Ulnar Styloid Process  18 cm    Across Hand at [employer]  20.5 cm    At [place] of 2nd Digit  6.6 cm          Quick Dash - 05/13/18 0001  Open a tight or new jar  No difficulty    Do heavy household chores (wash walls, wash floors)  Mild difficulty    Carry a shopping bag or briefcase  Mild difficulty    Wash your back  Mild difficulty    Use a knife to cut food  No difficulty    Recreational activities in which you take some force or impact through your arm, shoulder, or hand  (golf, hammering, tennis)  Mild difficulty    During the past week, to what extent has your arm, shoulder or hand problem interfered with your normal social activities with family, friends, neighbors, or groups?  Slightly    During the past week, to what extent has your arm, shoulder or hand problem limited your work or other regular daily activities  Slightly    Arm, shoulder, or hand pain.  Mild    Tingling (pins and needles) in your arm, shoulder, or hand  Mild    Difficulty Sleeping  No difficulty    DASH Score  18.18 %        Objective measurements completed on examination: See above findings.      [place] Adult PT Treatment/Exercise - 05/13/18 0001      Self-Care   Self-Care  Other Self-Care Comments    Other Self-Care Comments   what to look for in terms of lymphedema and how to monitor when exercising.       Exercises   Exercises  Other Exercises    Other Exercises   Given copy of and performed turning point shoulder stretches part 2; single arm doorway, arm/leg lengthening, XYW stretch      Given 1/2inch gray foam in thin stockinette for Rt axillary discomfort        PT Education - 05/13/18 1437    Education Details  Stretches per turning point handout part 2: single arm doorway stretch, alt UE/LE lengthening, teapot stretch, and XYW    Person(s) Educated  Patient    Methods  Explanation;Demonstration;Tactile cues;Verbal cues    Comprehension  Verbalized understanding;Returned demonstration          PT Long Term Goals - 05/13/18 1444      PT LONG TERM GOAL #1   Title  Pt will be ind with initial HEP for radiation preparation and throughout radiation    Time  1    Period  Days    Status  Achieved      PT LONG TERM GOAL #2   Title  Pt will be aware of signs and symptoms of lymphedema     Time  1    Period  Days    Status  Achieved      PT LONG TERM GOAL #3   Title  PT will update LTGs if patient returns after radiation completion    Time  8    Period   Weeks    Status  New    Target Date  07/08/18             Plan - 05/13/18 1438    Clinical Impression Statement  Christine Leon presents today after Rt lumpectomy on 10/16 without significant limitations except pulling in the axilla during flexion and abduction end range.  She is having almost no pain, no limitations, and is able to get into a radiation postion with no limitations.  She was educated on the effects of radiation and the importance of stretching as tolerated throughout.  She was given a small 1/2  inch gray foam insert for the Rt axilla due to some feelings of fullness here.  She is aware to return with any symptoms of lymphedema or after radiation to transition into strengthening.      History and Personal Factors relevant to plan of care:  Stage I Lt breast cancer ER/PR positive and HER2 negative with lumpectomy on 10/16 by Dr. Donne Hazel and SLNB 0/3 nodes positive. Will not need chemotherapy and radiation begins next week or soonStage I Lt breast cancer ER/PR positive and HER2 negative with lumpectomy on 10/16 by Dr. Donne Hazel and SLNB 0/3 nodes positive. Will not need chemotherapy and radiation begins next week or soon    Clinical Presentation  Evolving    Clinical Presentation due to:  post op status    Clinical Decision Making  Moderate    Rehab Potential  Excellent    Clinical Impairments Affecting Rehab Potential  will be starting radiation    PT Frequency  --   as needed   PT Duration  8 weeks    PT Treatment/Interventions  ADLs/Self Care Home Management;Therapeutic exercise;Patient/family education;Passive range of motion    PT Next Visit Plan  if pt returns reassess status; work towards strength ABC    PT Home Exercise Plan  given Stretches per turning point handout part 2: single arm doorway stretch, alt UE/LE lengthening, teapot stretch, and XYW    Consulted and Agree with Plan of Care  Patient       Patient will benefit from skilled therapeutic intervention in order to  improve the following deficits and impairments:  Impaired sensation, Decreased knowledge of precautions, Decreased activity tolerance  Visit Diagnosis: Aftercare following surgery for neoplasm  At risk for lymphedema  Abnormal posture     Problem List Patient Active Problem List   Diagnosis Date Noted  . Malignant neoplasm of upper-outer quadrant of right breast in female, estrogen receptor positive ([place]) 04/22/2018  . History of adenomatous polyp of colon 06/07/2017  . Aortic atherosclerosis ([place]) 04/15/2017  . Hot flashes 08/15/2014  . Retinal tear 08/15/2014  . Obesity 08/15/2014  . Left knee pain 07/25/2014  . Hypothyroidism 07/19/2014  . CELIAC SPRUE 04/20/2008  . Allergic rhinitis 07/20/2007  . Essential hypertension 06/05/2007    Shan Levans, PT 05/13/2018, 2:47 PM  [place], [place], [address] Phone: [phone]   Fax:  [phone]  Name: Christine Leon [facility] MRN: [id number] Date of Birth: [birth year]   PHYSICAL THERAPY DISCHARGE SUMMARY  Visits from Start of Care: 1     Plan: Patient agrees to discharge.  Patient goals were not met. Patient is being discharged due to not returning since the last visit.  ?????     Pt did not return after Eval.   Lyndee Hensen, PT, DPT 3:16 PM  12/26/18

## 2018-05-13 NOTE — Progress Notes (Signed)
[place] Psychosocial Distress Screening Clinical Social Work  Clinical Social Work was referred by distress screening protocol.  The patient scored a 5 on the Psychosocial Distress Thermometer which indicates moderate distress. Clinical Social Worker contacted patient by phone to assess for distress and other psychosocial needs. "I am doing great."  Does not anticipate any issues w transportation or needing help - has family and friends who are ready and willing to assist.  Briefly reviewed [place] services, encouraged patient to access when/if needed.    ONCBCN DISTRESS SCREENING 05/11/2018  Screening Type Initial Screening  Distress experienced in past week (1-10) 5  Emotional problem type Nervousness/Anxiety;Adjusting to illness    Clinical Social Worker follow up needed: No.  If yes, follow up plan:  Beverely Pace, [place], LCSW Clinical Social Worker Phone:  [phone]

## 2018-05-18 ENCOUNTER — Ambulatory Visit
Admission: RE | Admit: 2018-05-18 | Discharge: 2018-05-18 | Disposition: A | Discharge status: Home / Self Care | Payer: Medicare HMO | Source: Ambulatory Visit | Attending: Radiation Oncology | Admitting: Radiation Oncology

## 2018-05-18 DIAGNOSIS — C50411 Malignant neoplasm of upper-outer quadrant of right female breast: Secondary | ICD-10-CM | POA: Insufficient documentation

## 2018-05-18 DIAGNOSIS — Z170 Estrogen receptor positive status [ER+]: Secondary | ICD-10-CM | POA: Diagnosis not present

## 2018-05-18 DIAGNOSIS — Z510 Encounter for antineoplastic radiation therapy: Secondary | ICD-10-CM | POA: Insufficient documentation

## 2018-05-18 NOTE — Progress Notes (Signed)
  Radiation Oncology         [phone]) [phone] ________________________________  Name: Christine Leon [facility] MRN: [id number]  Date: 05/18/2018  DOB: [birth year]  SIMULATION AND TREATMENT PLANNING NOTE    Outpatient  DIAGNOSIS:     ICD-10-CM   1. Malignant neoplasm of upper-outer quadrant of right breast in female, estrogen receptor positive ([place]) C50.411    Z17.0     NARRATIVE:  The patient was brought to the [facility].  Identity was confirmed.  All relevant records and images related to the planned course of therapy were reviewed.  The patient freely provided informed written consent to proceed with treatment after reviewing the details related to the planned course of therapy. The consent form was witnessed and verified by the simulation staff.    Then, the patient was set-up in a stable reproducible supine position for radiation therapy with her ipsilateral arm over her head, and her upper body secured in a custom-made Vac-lok device.  CT images were obtained.  Surface markings were placed.  The CT images were loaded into the planning software.    TREATMENT PLANNING NOTE: Treatment planning then occurred.  The radiation prescription was entered and confirmed.     A total of 3 medically necessary complex treatment devices were fabricated and supervised by me: 2 fields with MLCs for custom blocks to protect heart, and lungs;  and, a Vac-lok. MORE COMPLEX DEVICES MAY BE MADE IN DOSIMETRY FOR FIELD IN FIELD BEAMS FOR DOSE HOMOGENEITY.  I have requested : 3D Simulation which is medically necessary to give adequate dose to at risk tissues while sparing lungs and heart.  I have requested a DVH of the following structures: lungs, heart, right lumpectomy cavity.    The patient will receive 40.05 Gy in 15 fractions to the right breast with 2 tangential fields.  This will be followed by a boost.  Optical Surface Tracking Plan:  Since intensity modulated radiotherapy (IMRT) and 3D  conformal radiation treatment methods are predicated on accurate and precise positioning for treatment, intrafraction motion monitoring is medically necessary to ensure accurate and safe treatment delivery. The ability to quantify intrafraction motion without excessive ionizing radiation dose can only be performed with optical surface tracking. Accordingly, surface imaging offers the opportunity to obtain 3D measurements of patient position throughout IMRT and 3D treatments without excessive radiation exposure. I am ordering optical surface tracking for this patient's upcoming course of radiotherapy.  ________________________________   Reference:  Ursula Alert, J, et al. Surface imaging-based analysis of intrafraction motion for breast radiotherapy patients.Journal of [place], n. 6, nov. 2014. ISSN [id number].  Available at: <http://www.jacmp.org/index.php/jacmp/article/view/4957>.    -----------------------------------  Eppie Gibson, MD

## 2018-05-20 DIAGNOSIS — Z510 Encounter for antineoplastic radiation therapy: Secondary | ICD-10-CM | POA: Diagnosis not present

## 2018-05-20 DIAGNOSIS — C50411 Malignant neoplasm of upper-outer quadrant of right female breast: Secondary | ICD-10-CM | POA: Diagnosis not present

## 2018-05-26 ENCOUNTER — Ambulatory Visit
Admission: RE | Admit: 2018-05-26 | Discharge: 2018-05-26 | Disposition: A | Discharge status: Home / Self Care | Payer: Medicare HMO | Source: Ambulatory Visit | Attending: Radiation Oncology | Admitting: Radiation Oncology

## 2018-05-26 DIAGNOSIS — Z510 Encounter for antineoplastic radiation therapy: Secondary | ICD-10-CM | POA: Diagnosis not present

## 2018-05-26 DIAGNOSIS — C50411 Malignant neoplasm of upper-outer quadrant of right female breast: Secondary | ICD-10-CM | POA: Diagnosis not present

## 2018-05-27 ENCOUNTER — Ambulatory Visit
Admission: RE | Admit: 2018-05-27 | Discharge: 2018-05-27 | Disposition: A | Discharge status: Home / Self Care | Payer: Medicare HMO | Source: Ambulatory Visit | Attending: Radiation Oncology | Admitting: Radiation Oncology

## 2018-05-27 ENCOUNTER — Telehealth: Payer: Self-pay | Admitting: Oncology

## 2018-05-27 DIAGNOSIS — C50411 Malignant neoplasm of upper-outer quadrant of right female breast: Secondary | ICD-10-CM | POA: Diagnosis not present

## 2018-05-27 DIAGNOSIS — Z510 Encounter for antineoplastic radiation therapy: Secondary | ICD-10-CM | POA: Diagnosis not present

## 2018-05-27 NOTE — Telephone Encounter (Signed)
Called regarding 12/16

## 2018-05-30 ENCOUNTER — Ambulatory Visit
Admission: RE | Admit: 2018-05-30 | Discharge: 2018-05-30 | Disposition: A | Discharge status: Home / Self Care | Payer: Medicare HMO | Source: Ambulatory Visit | Attending: Radiation Oncology | Admitting: Radiation Oncology

## 2018-05-30 DIAGNOSIS — Z170 Estrogen receptor positive status [ER+]: Secondary | ICD-10-CM

## 2018-05-30 DIAGNOSIS — C50411 Malignant neoplasm of upper-outer quadrant of right female breast: Secondary | ICD-10-CM | POA: Diagnosis not present

## 2018-05-30 DIAGNOSIS — Z510 Encounter for antineoplastic radiation therapy: Secondary | ICD-10-CM | POA: Diagnosis not present

## 2018-05-30 MED ORDER — RADIAPLEXRX EX GEL
Freq: Two times a day (BID) | CUTANEOUS | Status: DC
  Administered 2018-05-30: 14:00:00 via TOPICAL

## 2018-05-30 MED ORDER — ALRA NON-METALLIC DEODORANT (RAD-ONC)
1.0000 "application " | Freq: Once | TOPICAL | Status: AC
  Administered 2018-05-30: 1 via TOPICAL

## 2018-05-31 ENCOUNTER — Ambulatory Visit
Admission: RE | Admit: 2018-05-31 | Discharge: 2018-05-31 | Disposition: A | Discharge status: Home / Self Care | Payer: Medicare HMO | Source: Ambulatory Visit | Attending: Radiation Oncology | Admitting: Radiation Oncology

## 2018-05-31 DIAGNOSIS — C50411 Malignant neoplasm of upper-outer quadrant of right female breast: Secondary | ICD-10-CM | POA: Diagnosis not present

## 2018-05-31 DIAGNOSIS — Z510 Encounter for antineoplastic radiation therapy: Secondary | ICD-10-CM | POA: Diagnosis not present

## 2018-06-01 ENCOUNTER — Ambulatory Visit
Admission: RE | Admit: 2018-06-01 | Discharge: 2018-06-01 | Disposition: A | Discharge status: Home / Self Care | Payer: Medicare HMO | Source: Ambulatory Visit | Attending: Radiation Oncology | Admitting: Radiation Oncology

## 2018-06-01 DIAGNOSIS — C50411 Malignant neoplasm of upper-outer quadrant of right female breast: Secondary | ICD-10-CM | POA: Diagnosis not present

## 2018-06-01 DIAGNOSIS — Z510 Encounter for antineoplastic radiation therapy: Secondary | ICD-10-CM | POA: Diagnosis not present

## 2018-06-02 ENCOUNTER — Ambulatory Visit
Admission: RE | Admit: 2018-06-02 | Discharge: 2018-06-02 | Disposition: A | Discharge status: Home / Self Care | Payer: Medicare HMO | Source: Ambulatory Visit | Attending: Radiation Oncology | Admitting: Radiation Oncology

## 2018-06-02 DIAGNOSIS — Z510 Encounter for antineoplastic radiation therapy: Secondary | ICD-10-CM | POA: Diagnosis not present

## 2018-06-02 DIAGNOSIS — C50411 Malignant neoplasm of upper-outer quadrant of right female breast: Secondary | ICD-10-CM | POA: Diagnosis not present

## 2018-06-03 ENCOUNTER — Ambulatory Visit
Admission: RE | Admit: 2018-06-03 | Discharge: 2018-06-03 | Disposition: A | Discharge status: Home / Self Care | Payer: Medicare HMO | Source: Ambulatory Visit | Attending: Radiation Oncology | Admitting: Radiation Oncology

## 2018-06-03 DIAGNOSIS — C50411 Malignant neoplasm of upper-outer quadrant of right female breast: Secondary | ICD-10-CM | POA: Diagnosis not present

## 2018-06-03 DIAGNOSIS — Z510 Encounter for antineoplastic radiation therapy: Secondary | ICD-10-CM | POA: Diagnosis not present

## 2018-06-06 ENCOUNTER — Ambulatory Visit
Admission: RE | Admit: 2018-06-06 | Discharge: 2018-06-06 | Disposition: A | Discharge status: Home / Self Care | Payer: Medicare HMO | Source: Ambulatory Visit | Attending: Radiation Oncology | Admitting: Radiation Oncology

## 2018-06-06 DIAGNOSIS — Z510 Encounter for antineoplastic radiation therapy: Secondary | ICD-10-CM | POA: Diagnosis not present

## 2018-06-06 DIAGNOSIS — C50411 Malignant neoplasm of upper-outer quadrant of right female breast: Secondary | ICD-10-CM | POA: Diagnosis not present

## 2018-06-07 ENCOUNTER — Ambulatory Visit
Admission: RE | Admit: 2018-06-07 | Discharge: 2018-06-07 | Disposition: A | Discharge status: Home / Self Care | Payer: Medicare HMO | Source: Ambulatory Visit | Attending: Radiation Oncology | Admitting: Radiation Oncology

## 2018-06-07 DIAGNOSIS — Z510 Encounter for antineoplastic radiation therapy: Secondary | ICD-10-CM | POA: Diagnosis not present

## 2018-06-07 DIAGNOSIS — C50411 Malignant neoplasm of upper-outer quadrant of right female breast: Secondary | ICD-10-CM | POA: Diagnosis not present

## 2018-06-08 ENCOUNTER — Ambulatory Visit
Admission: RE | Admit: 2018-06-08 | Discharge: 2018-06-08 | Disposition: A | Discharge status: Home / Self Care | Payer: Medicare HMO | Source: Ambulatory Visit | Attending: Radiation Oncology | Admitting: Radiation Oncology

## 2018-06-08 DIAGNOSIS — Z510 Encounter for antineoplastic radiation therapy: Secondary | ICD-10-CM | POA: Diagnosis not present

## 2018-06-08 DIAGNOSIS — C50411 Malignant neoplasm of upper-outer quadrant of right female breast: Secondary | ICD-10-CM | POA: Diagnosis not present

## 2018-06-13 ENCOUNTER — Ambulatory Visit
Admission: RE | Admit: 2018-06-13 | Discharge: 2018-06-13 | Disposition: A | Discharge status: Home / Self Care | Payer: Medicare HMO | Source: Ambulatory Visit | Attending: Radiation Oncology | Admitting: Radiation Oncology

## 2018-06-13 ENCOUNTER — Ambulatory Visit: Payer: Medicare HMO | Admitting: Radiation Oncology

## 2018-06-13 DIAGNOSIS — Z510 Encounter for antineoplastic radiation therapy: Secondary | ICD-10-CM | POA: Diagnosis not present

## 2018-06-13 DIAGNOSIS — Z170 Estrogen receptor positive status [ER+]: Secondary | ICD-10-CM | POA: Diagnosis not present

## 2018-06-13 DIAGNOSIS — C50411 Malignant neoplasm of upper-outer quadrant of right female breast: Secondary | ICD-10-CM | POA: Diagnosis not present

## 2018-06-14 ENCOUNTER — Ambulatory Visit
Admission: RE | Admit: 2018-06-14 | Discharge: 2018-06-14 | Disposition: A | Discharge status: Home / Self Care | Payer: Medicare HMO | Source: Ambulatory Visit | Attending: Radiation Oncology | Admitting: Radiation Oncology

## 2018-06-14 DIAGNOSIS — C50411 Malignant neoplasm of upper-outer quadrant of right female breast: Secondary | ICD-10-CM | POA: Diagnosis not present

## 2018-06-14 DIAGNOSIS — Z510 Encounter for antineoplastic radiation therapy: Secondary | ICD-10-CM | POA: Diagnosis not present

## 2018-06-15 ENCOUNTER — Ambulatory Visit: Payer: Medicare HMO | Admitting: Radiation Oncology

## 2018-06-15 ENCOUNTER — Ambulatory Visit
Admission: RE | Admit: 2018-06-15 | Discharge: 2018-06-15 | Disposition: A | Discharge status: Home / Self Care | Payer: Medicare HMO | Source: Ambulatory Visit | Attending: Radiation Oncology | Admitting: Radiation Oncology

## 2018-06-15 DIAGNOSIS — C50411 Malignant neoplasm of upper-outer quadrant of right female breast: Secondary | ICD-10-CM | POA: Diagnosis not present

## 2018-06-15 DIAGNOSIS — Z510 Encounter for antineoplastic radiation therapy: Secondary | ICD-10-CM | POA: Diagnosis not present

## 2018-06-16 ENCOUNTER — Ambulatory Visit
Admission: RE | Admit: 2018-06-16 | Discharge: 2018-06-16 | Disposition: A | Discharge status: Home / Self Care | Payer: Medicare HMO | Source: Ambulatory Visit | Attending: Radiation Oncology | Admitting: Radiation Oncology

## 2018-06-16 DIAGNOSIS — Z510 Encounter for antineoplastic radiation therapy: Secondary | ICD-10-CM | POA: Diagnosis not present

## 2018-06-16 DIAGNOSIS — C50411 Malignant neoplasm of upper-outer quadrant of right female breast: Secondary | ICD-10-CM | POA: Diagnosis not present

## 2018-06-17 ENCOUNTER — Ambulatory Visit
Admission: RE | Admit: 2018-06-17 | Discharge: 2018-06-17 | Disposition: A | Discharge status: Home / Self Care | Payer: Medicare HMO | Source: Ambulatory Visit | Attending: Radiation Oncology | Admitting: Radiation Oncology

## 2018-06-17 DIAGNOSIS — Z510 Encounter for antineoplastic radiation therapy: Secondary | ICD-10-CM | POA: Diagnosis not present

## 2018-06-17 DIAGNOSIS — C50411 Malignant neoplasm of upper-outer quadrant of right female breast: Secondary | ICD-10-CM | POA: Diagnosis not present

## 2018-06-20 ENCOUNTER — Ambulatory Visit
Admission: RE | Admit: 2018-06-20 | Discharge: 2018-06-20 | Disposition: A | Discharge status: Home / Self Care | Payer: Medicare HMO | Source: Ambulatory Visit | Attending: Radiation Oncology | Admitting: Radiation Oncology

## 2018-06-20 DIAGNOSIS — Z510 Encounter for antineoplastic radiation therapy: Secondary | ICD-10-CM | POA: Diagnosis not present

## 2018-06-21 ENCOUNTER — Ambulatory Visit
Admission: RE | Admit: 2018-06-21 | Discharge: 2018-06-21 | Disposition: A | Discharge status: Home / Self Care | Payer: Medicare HMO | Source: Ambulatory Visit | Attending: Radiation Oncology | Admitting: Radiation Oncology

## 2018-06-21 DIAGNOSIS — Z510 Encounter for antineoplastic radiation therapy: Secondary | ICD-10-CM | POA: Diagnosis not present

## 2018-06-22 ENCOUNTER — Ambulatory Visit: Payer: Medicare HMO

## 2018-06-23 ENCOUNTER — Ambulatory Visit
Admission: RE | Admit: 2018-06-23 | Discharge: 2018-06-23 | Disposition: A | Discharge status: Home / Self Care | Payer: Medicare HMO | Source: Ambulatory Visit | Attending: Radiation Oncology | Admitting: Radiation Oncology

## 2018-06-23 DIAGNOSIS — Z510 Encounter for antineoplastic radiation therapy: Secondary | ICD-10-CM | POA: Diagnosis not present

## 2018-06-24 ENCOUNTER — Ambulatory Visit: Payer: Medicare HMO

## 2018-06-24 ENCOUNTER — Telehealth: Payer: Self-pay | Admitting: *Deleted

## 2018-06-24 NOTE — Telephone Encounter (Signed)
Pt called to this RN to state concern relating to appointments on Monday 12/16.  Archie had to reschedule her last radiation treatment to 12/16 at 51 from today due to " I am at the hospital with my husband who has had a stroke "  She is concerned due to appt with Dr Jannifer Rodney is at 76 and " will I be able to get to my radiation appointment by 930 ?"  This RN informed pt above should be ok- if her appointment with Dr Jannifer Rodney runs late this RN will call to [place].  No further needs at this time.

## 2018-06-26 NOTE — Progress Notes (Signed)
[name]  [phone] [phone]     ID: [name] DOB: 06/19/1951  MR#: [id number]  GUR#:[id number]  Patient Care Team: Marin Olp, MD as PCP - General (Family Medicine) Rolm Bookbinder, MD as Consulting Physician (General Surgery) Taitum Menton, Virgie Dad, MD as Consulting Physician (Oncology) Eppie Gibson, MD as Attending Physician (Radiation Oncology) Armbruster, Carlota Raspberry, MD as Consulting Physician (Gastroenterology) OTHER MD:  CHIEF COMPLAINT: Estrogen receptor positive breast cancer  CURRENT TREATMENT: Tamoxifen   HISTORY OF CURRENT ILLNESS: From the original intake note:  Christine Leon "Christine Leon" had previous mammography in 2016 showing distortion of the right breast with benign findings along with a 86-monthfollow up. She did not have further mammography until she had bilateral diagnostic mammography with tomography and right breast ultrasonography at [facility] 03/29/2018 showing: breast density category B. There was a suspicious mass in the right breast upper outer quadrant at 12 o'clock measuring 0.5 x 0.4 x 0.4 cm. There were multiple adjacent cysts. There was no sonographic evidence of abnormal lymph nodes or adenopathy.   Accordingly on 03/30/2018 she proceeded to biopsy of the right breast area in question. The pathology from this procedure showed (([id number]: Invasive ductal carcinoma, grade I. Prognostic indicators significant for: estrogen receptor, 100% positive and progesterone receptor, 80% positive, both with string staining intensity. Proliferation marker Ki67 at 3%. HER2 negative with an immunohistochemistry of (1+).  The patient's subsequent history is as detailed below.  INTERVAL HISTORY: Christine Ballerreturns today for follow-up of her estrogen receptor positive breast cancer.   Since her last visit here, she underwent a right breast lumpectomy on 04/27/2018. The pathology from this procedure  showed (([id number]: invasive ductal carcinoma 1.6 cm, nottingham grade I of III. Margins of resection are not involved with the closest margin at 5 mm, medial. There are biopsy site changes. One lymph node was biopsied, showing negative for carcinoma.   The Oncotype DX score was 17 predicting a risk of outside the breast recurrence over the next 9 years of 5% if the patient's only systemic therapy is tamoxifen for 5 years. It also predicts no benefit from chemotherapy.  She has been undergoing radiation, with her last treatment today. She experienced fatigue in the beginning, but she believes having the small breaks over the holidays really helped her with that. Her skin is slightly blistered from treatment, and it has darkened.    REVIEW OF SYSTEMS: Christine Balleris doing well overall. Her husband recently suffered a medical incident, and he is not doing very well. She has been taking care of him to the best of her ability. The patient denies unusual headaches, visual changes, nausea, vomiting, or dizziness. There has been no unusual cough, phlegm production, or pleurisy. This been no change in bowel or bladder habits. The patient denies unexplained weight loss, bleeding, or fever. A detailed review of systems was otherwise noncontributory.    PAST MEDICAL HISTORY: Past Medical History:  Diagnosis Date  . Allergy   . Celiac sprue   . Hypertension   . Hypothyroidism   . PONV (postoperative nausea and vomiting)     PAST SURGICAL HISTORY: Past Surgical History:  Procedure Laterality Date  . ABDOMINAL HYSTERECTOMY     in 545s fibroids. including cervix removal. per patient was told no further pap smears after.   .Marland Kitchenarthroscopic knee surgery     2015 under Dr. CTheda Sers . BREAST LUMPECTOMY WITH RADIOACTIVE SEED AND SENTINEL LYMPH NODE BIOPSY Right 04/27/2018  Procedure: RIGHT BREAST LUMPECTOMY WITH RADIOACTIVE SEED AND RIGHT AXILLARYSENTINEL LYMPH NODE BIOPSY ERAS PATHWAY;  Surgeon: Rolm Bookbinder, MD;  Location: [facility];  Service: General;  Laterality: Right;  PECTORAL BLOCK  . CHOLECYSTECTOMY    . COCCYX REMOVAL     after cheerleading accident  . OOPHORECTOMY     in 65s  . TONSILLECTOMY    Hysterectomy with BSO (fibroids)  FAMILY HISTORY Family History  Problem Relation Age of Onset  . Hypertension Mother   . Coronary artery disease Father        11  . COPD Father        passed age 28  . Breast cancer Maternal Aunt   The patient's father died at age 64 due to COPD and heart valve issues. The patient's mother is alive at age 36. The patient had 1 brother (deceased) and no sisters. There was a maternal aunt who was diagnosed with breast cancer in the 39's. The patient' denies any other family members with breast, ovarian, or other cancers.  The patient does not meet criteria for genetics testing  GYNECOLOGIC HISTORY:  No LMP recorded (lmp unknown). Patient has had a hysterectomy. Menarche: [age] years old Age at first live birth: 67 years old  She is GX P2. She is status post total hysterectomy with BSO in 1995. Her LMP was in her late 67's. She took HRT for a few years and recently discontinued.     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 Her husband, [name], is retired from working in Scientist, physiological for [employer]. [name] recently suffered from a medical incident and Christine Leon has been caring for him. The patient's oldest, a son named [name], lives in [place] and works as a [employer] Land. The patient's youngest, a daughter named [name], lives in [place] and works as a Print production planner. The patient has 4 grandchildren. The patient is not a church attender    ADVANCED DIRECTIVES: Her husband recently suffered a medical incident, so she plans on naming her son, Christine Leon, as Medical Power of Attorney.     HEALTH MAINTENANCE: Social History   Tobacco Use  . Smoking status: Former Smoker     Packs/day: 1.00    Years: 39.00    Pack years: 39.00    Types: Cigarettes    Last attempt to quit: 07/13/2006    Years since quitting: 11.9  . Smokeless tobacco: Never Used  Substance Use Topics  . Alcohol use: Yes    Alcohol/week: 1.0 standard drinks    Types: 1 Standard drinks or equivalent per week  . Drug use: No     Colonoscopy: 2018/ Dr. Havery Moros (repeat in 2 years)  PAP:  Bone density: scheduled for 2019   Allergies  Allergen Reactions  . Gluten Meal     Has celic  . Wheat Bran     Has celic   . Cephalexin Hives    Current Outpatient Medications  Medication Sig Dispense Refill  . ALPHA LIPOIC ACID PO Take 1 tablet by mouth daily.    . Cholecalciferol (VITAMIN D-3 PO) Take 1,500 Units by mouth daily.    Marland Kitchen escitalopram (LEXAPRO) 5 MG tablet Take 1 tablet (5 mg total) by mouth daily. 30 tablet 5  . fish oil-omega-3 fatty acids 1000 MG capsule Take 1 g by mouth daily.      . fluticasone (FLONASE) 50 MCG/ACT nasal spray Place 2 sprays into both nostrils  daily. 16 g 6  . glucosamine-chondroitin 500-400 MG tablet Take 1 tablet by mouth daily.      Marland Kitchen levothyroxine (SYNTHROID, LEVOTHROID) 125 MCG tablet Take 125 mcg by mouth daily before breakfast.    . lisinopril-hydrochlorothiazide (PRINZIDE,ZESTORETIC) 20-25 MG tablet TAKE ONE TABLET BY MOUTH ONCE DAILY 90 tablet 3  . oxyCODONE (OXY IR/ROXICODONE) 5 MG immediate release tablet Take 1 tablet (5 mg total) by mouth every 6 (six) hours as needed for moderate pain, severe pain or breakthrough pain. 10 tablet 0  . tamoxifen (NOLVADEX) 20 MG tablet Take 1 tablet (20 mg total) by mouth daily. 90 tablet 12  . vitamin B-12 (CYANOCOBALAMIN) 500 MCG tablet Take 500 mcg by mouth daily.     No current facility-administered medications for this visit.     OBJECTIVE: Middle-aged white woman who appears well  Vitals:   06/27/18 0812  BP: (!) 144/67  Pulse: 64  Resp: 18  Temp: 98.1 F (36.7 C)  SpO2: 97%     Body mass index is  31.76 kg/m.   Wt Readings from Last 3 Encounters:  06/27/18 193 lb 12.8 oz (87.9 kg)  05/11/18 197 lb 12.8 oz (89.7 kg)  04/27/18 197 lb 1.5 oz (89.4 kg)      ECOG FS:1 - Symptomatic but completely ambulatory  Sclerae unicteric, EOMs intact Oropharynx clear and moist No cervical or supraclavicular adenopathy Lungs no rales or rhonchi Heart regular rate and rhythm Abd soft, nontender, positive bowel sounds MSK no focal spinal tenderness, no upper extremity lymphedema Neuro: nonfocal, well oriented, appropriate affect Breasts: The right breast is status post lumpectomy and radiation.  There is minimal hyperpigmentation.  There is no significant desquamation.  The cosmetic result is good.  There is no suspicious finding.  The left breast is benign.  Both axillae are benign.  LAB RESULTS:  CMP     Component Value Date/Time   NA 142 04/22/2018 1300   K 3.5 04/22/2018 1300   CL 106 04/22/2018 1300   CO2 27 04/22/2018 1300   GLUCOSE 110 (H) 04/22/2018 1300   BUN 13 04/22/2018 1300   CREATININE 0.82 04/22/2018 1300   CALCIUM 9.3 04/22/2018 1300   PROT 7.3 04/15/2017 1039   ALBUMIN 4.6 04/15/2017 1039   AST 32 04/15/2017 1039   ALT 55 (H) 04/15/2017 1039   ALKPHOS 60 04/15/2017 1039   BILITOT 0.8 04/15/2017 1039   GFRNONAA >60 04/22/2018 1300   GFRAA >60 04/22/2018 1300    No results found for: TOTALPROTELP, ALBUMINELP, A1GS, A2GS, BETS, BETA2SER, GAMS, MSPIKE, SPEI  No results found for: KPAFRELGTCHN, LAMBDASER, KAPLAMBRATIO  Lab Results  Component Value Date   WBC 4.5 04/15/2017   NEUTROABS 4.6 12/27/2016   HGB 14.7 04/15/2017   HCT 44.3 04/15/2017   MCV 91.2 04/15/2017   PLT 271.0 04/15/2017    @LASTCHEMISTRY @  No results found for: LABCA2  No components found for: BZJIRC789  No results for input(s): INR in the last 168 hours.  No results found for: LABCA2  No results found for: FYB017  No results found for: PZW258  No results found for: NID782  No  results found for: CA2729  No components found for: HGQUANT  No results found for: CEA1 / No results found for: CEA1   No results found for: AFPTUMOR  No results found for: CHROMOGRNA  No results found for: PSA1  No visits with results within 3 Day(s) from this visit.  Latest known visit with results is:  Admission  on 04/27/2018, Discharged on 04/27/2018  Component Date Value Ref Range Status  . Sodium 04/22/2018 142  135 - 145 mmol/L Final  . Potassium 04/22/2018 3.5  3.5 - 5.1 mmol/L Final  . Chloride 04/22/2018 106  98 - 111 mmol/L Final  . CO2 04/22/2018 27  22 - 32 mmol/L Final  . Glucose, Bld 04/22/2018 110* 70 - 99 mg/dL Final  . BUN 04/22/2018 13  8 - 23 mg/dL Final  . Creatinine, Ser 04/22/2018 0.82  0.44 - 1.00 mg/dL Final  . Calcium 04/22/2018 9.3  8.9 - 10.3 mg/dL Final  . GFR calc non Af Amer 04/22/2018 >60  >60 mL/min Final  . GFR calc Af Amer 04/22/2018 >60  >60 mL/min Final   Comment: (NOTE) The eGFR has been calculated using the CKD EPI equation. This calculation has not been validated in all clinical situations. eGFR's persistently <60 mL/min signify possible Chronic Kidney Disease.   Georgiann Hahn gap 04/22/2018 9  5 - 15 Final   Performed at [facility], [place] [address] [place], [place] [address]    (this displays the last labs from the last 3 days)  No results found for: TOTALPROTELP, ALBUMINELP, A1GS, A2GS, BETS, BETA2SER, GAMS, MSPIKE, SPEI (this displays SPEP labs)  No results found for: KPAFRELGTCHN, LAMBDASER, KAPLAMBRATIO (kappa/lambda light chains)  No results found for: HGBA, HGBA2QUANT, HGBFQUANT, HGBSQUAN (Hemoglobinopathy evaluation)   No results found for: LDH  No results found for: IRON, TIBC, IRONPCTSAT (Iron and TIBC)  No results found for: FERRITIN  Urinalysis    Component Value Date/Time   COLORURINE YELLOW 12/27/2016 0626   APPEARANCEUR CLEAR 12/27/2016 0626   LABSPEC 1.006 12/27/2016 0626   PHURINE 7.0 12/27/2016 0626    GLUCOSEU NEGATIVE 12/27/2016 0626   HGBUR NEGATIVE 12/27/2016 0626   BILIRUBINUR Negative 04/15/2017 1047   KETONESUR NEGATIVE 12/27/2016 0626   PROTEINUR Negative 04/15/2017 1047   PROTEINUR NEGATIVE 12/27/2016 0626   UROBILINOGEN 0.2 04/15/2017 1047   NITRITE Negative 04/15/2017 1047   NITRITE NEGATIVE 12/27/2016 0626   LEUKOCYTESUR Moderate (2+) (A) 04/15/2017 1047     STUDIES: No results found.  ELIGIBLE FOR AVAILABLE RESEARCH PROTOCOL: no  ASSESSMENT: 67 y.o. [place] Ser, [place] woman status post right breast upper outer quadrant biopsy 03/30/2018 for a clinical T1a N0, stage IA invasive ductal carcinoma, grade 1, estrogen and progesterone receptor positive, HER-2 not amplified, with an MIB-1 of 3%.  (1) status post right lumpectomy and sentinel lymph node sampling 04/27/2018 for a pT1c pN0, stage IA invasive ductal carcinoma, grade 1, with negative margins  (a) 1 sentinel lymph node was removed  (2) Oncotype DX score of 17 predicts a risk of recurrence outside the breast over 9 years of 5% if the patient's only systemic therapy is an antiestrogen for 5 years.  It also predicts no benefit from chemotherapy  (3) adjuvant radiation completed 06/28/2018  (4) to start tamoxifen 07/27/2017   PLAN: Christine Leon did remarkably well with her surgery and radiation and is now ready to consider antiestrogens.  We discussed the difference between tamoxifen and anastrozole in detail. She understands that anastrozole and the aromatase inhibitors in general work by blocking estrogen production. Accordingly vaginal dryness, decrease in bone density, and of course hot flashes can result. The aromatase inhibitors can also negatively affect the cholesterol profile, although that is a minor effect. One out of 5 women on aromatase inhibitors we will feel "old and achy". This arthralgia/myalgia syndrome, which resembles fibromyalgia clinically, does resolve with stopping  the medications. Accordingly this is  not a reason to not try an aromatase inhibitor but it is a frequent reason to stop it (in other words 20% of women will not be able to tolerate these medications).  Tamoxifen on the other hand does not block estrogen production. It does not "take away a woman's estrogen". It blocks the estrogen receptor in breast cells. Like anastrozole, it can also cause hot flashes. As opposed to anastrozole, tamoxifen has many estrogen-like effects. It is technically an estrogen receptor modulator. This means that in some tissues tamoxifen works like estrogen-- for example it helps strengthen the bones. It tends to improve the cholesterol profile. It can cause thickening of the endometrial lining, and even endometrial polyps or rarely cancer of the uterus.(The risk of uterine cancer due to tamoxifen is one additional cancer per thousand women year). It can cause vaginal wetness or stickiness. It can cause blood clots through this estrogen-like effect--the risk of blood clots with tamoxifen is exactly the same as with birth control pills or hormone replacement.  Neither of these agents causes mood changes or weight gain, despite the popular belief that they can have these side effects. We have data from studies comparing either of these drugs with placebo, and in those cases the control group had the same amount of weight gain and depression as the group that took the drug.  After much discussion we decided tamoxifen would be better for her.  She is status post hysterectomy.  And she took hormone replacement for many years with no complications.  Also she has not had a bone density in many years although she tells me the last time she did have one it was normal  We decided she would start tamoxifen 07/27/2017 to give her a little time to recover from her radiation treatments.  She will see me in April.  If she tolerates tamoxifen well the plan will be to do that for a total of 5 years  She knows to call for any other  issues that may develop before then.    Shane Badeaux, Virgie Dad, MD  06/27/18 8:34 AM Medical Oncology and Hematology [facility] [address] [place], [place] [address] Tel. [phone]    Fax. [phone]   I, Jacqualyn Posey am acting as a Education administrator for Chauncey Cruel, MD.   I, Lurline Del MD, have reviewed the above documentation for accuracy and completeness, and I agree with the above.

## 2018-06-27 ENCOUNTER — Ambulatory Visit
Admission: RE | Admit: 2018-06-27 | Discharge: 2018-06-27 | Disposition: A | Discharge status: Home / Self Care | Payer: Medicare HMO | Source: Ambulatory Visit | Attending: Radiation Oncology | Admitting: Radiation Oncology

## 2018-06-27 ENCOUNTER — Telehealth: Payer: Self-pay | Admitting: Oncology

## 2018-06-27 ENCOUNTER — Ambulatory Visit: Payer: Medicare HMO

## 2018-06-27 ENCOUNTER — Inpatient Hospital Stay: Payer: Medicare HMO | Attending: Oncology | Admitting: Oncology

## 2018-06-27 VITALS — BP 144/67 | HR 64 | Temp 98.1°F | Resp 18 | Wt 193.8 lb

## 2018-06-27 DIAGNOSIS — Z803 Family history of malignant neoplasm of breast: Secondary | ICD-10-CM

## 2018-06-27 DIAGNOSIS — C50411 Malignant neoplasm of upper-outer quadrant of right female breast: Secondary | ICD-10-CM | POA: Insufficient documentation

## 2018-06-27 DIAGNOSIS — Z170 Estrogen receptor positive status [ER+]: Secondary | ICD-10-CM

## 2018-06-27 DIAGNOSIS — Z510 Encounter for antineoplastic radiation therapy: Secondary | ICD-10-CM | POA: Diagnosis not present

## 2018-06-27 DIAGNOSIS — I10 Essential (primary) hypertension: Secondary | ICD-10-CM | POA: Diagnosis not present

## 2018-06-27 DIAGNOSIS — Z87891 Personal history of nicotine dependence: Secondary | ICD-10-CM | POA: Insufficient documentation

## 2018-06-27 DIAGNOSIS — E039 Hypothyroidism, unspecified: Secondary | ICD-10-CM | POA: Diagnosis not present

## 2018-06-27 MED ORDER — TAMOXIFEN CITRATE 20 MG PO TABS: 20.0000 mg | ORAL_TABLET | Freq: Every day | ORAL | 12 refills | Status: AC

## 2018-06-27 NOTE — Telephone Encounter (Signed)
Gave patient avs report and appointments for April  °

## 2018-06-28 ENCOUNTER — Ambulatory Visit: Payer: Medicare HMO

## 2018-06-29 ENCOUNTER — Ambulatory Visit
Admission: RE | Admit: 2018-06-29 | Discharge: 2018-06-29 | Disposition: A | Discharge status: Home / Self Care | Payer: Medicare HMO | Source: Ambulatory Visit | Attending: Radiation Oncology | Admitting: Radiation Oncology

## 2018-06-29 DIAGNOSIS — C50411 Malignant neoplasm of upper-outer quadrant of right female breast: Secondary | ICD-10-CM | POA: Diagnosis not present

## 2018-06-29 DIAGNOSIS — Z510 Encounter for antineoplastic radiation therapy: Secondary | ICD-10-CM | POA: Diagnosis not present

## 2018-07-01 ENCOUNTER — Encounter: Payer: Self-pay | Admitting: Radiation Oncology

## 2018-07-01 NOTE — Progress Notes (Signed)
  Radiation Oncology         [phone] ________________________________  Name: Christine Leon MRN: [id number]  Date: 07/01/2018  DOB: 01-09-1951  End of Treatment Note  Diagnosis:   Cancer Staging Malignant neoplasm of upper-outer quadrant of right breast in female, estrogen receptor positive ([facility]) Staging form: Breast, AJCC 8th Edition - Clinical: Stage IA (cT1c, cN0, cM0, G1, ER+, PR+, HER2-) - Signed by Eppie Gibson, MD on 05/11/2018 - Pathologic: Stage IA (pT1c, pN0, cM0, G1, ER+, PR+, HER2-) - Signed by Eppie Gibson, MD on 05/13/2018     Indication for treatment:  Curative       Radiation treatment dates:   05/26/2018-06/29/2018  Site/dose:   1. Right breast, 2.67 Gy x 15 fractions for a total dose of 40.05 Gy            2. Boost, 2 Gy x 5 fractions for a total dose of 10 Gy  Beams/energy:   1. 3D, 6X, 10X         2. Electron, 12E  Narrative: The patient tolerated radiation treatment relatively well. At the beginning of her treatment, she noted moderate fatigue, however, she denied pain. On PE, it was noted slight erythema, right breast. Towards the end of her treatment, she noted soreness to her right breast and redness to the radiation site. She was noted to be using Radiaplex twice daily as instructed. On PE, it was noted moderate erythema, right breast.   Plan: The patient has completed radiation treatment. The patient will return to radiation oncology clinic for routine followup in one month. I advised them to call or return sooner if they have any questions or concerns related to their recovery or treatment.  -----------------------------------  Eppie Gibson, MD  This document serves as a record of services personally performed by Eppie Gibson, MD. It was created on her behalf by Christine Leon, a trained medical scribe. The creation of this record is based on the scribe's personal observations and the provider's statements to them. This document has been checked  and approved by the attending provider.

## 2018-07-18 ENCOUNTER — Other Ambulatory Visit: Payer: Self-pay | Admitting: Family Medicine

## 2018-07-25 ENCOUNTER — Ambulatory Visit: Payer: Medicare HMO | Admitting: Oncology

## 2018-07-26 ENCOUNTER — Encounter: Payer: Self-pay | Admitting: *Deleted

## 2018-07-27 ENCOUNTER — Ambulatory Visit: Payer: Self-pay | Admitting: Radiation Oncology

## 2018-07-28 ENCOUNTER — Encounter: Payer: Self-pay | Admitting: Radiation Oncology

## 2018-07-28 NOTE — Progress Notes (Signed)
[name] presents for follow up of radiation completed 06/29/18 to her right breast. She reports that her skin has healed well. She is currently using a vitamin E containing lotion to her right breast area. She last saw Dr. Jana Hakim on 06/27/18 and has started Tamoxifen. She will see Dr. Jana Hakim next on 10/26/18.   BP 139/62 (BP Location: Left Arm, Patient Position: Sitting)   Pulse 67   Temp (!) 97.5 F (36.4 C) (Oral)   Resp 20   Ht 5' 5.5" (1.664 m)   Wt 194 lb 6.4 oz (88.2 kg)   LMP  (LMP Unknown)   SpO2 99%   BMI 31.86 kg/m    Wt Readings from Last 3 Encounters:  08/03/18 194 lb 6.4 oz (88.2 kg)  06/27/18 193 lb 12.8 oz (87.9 kg)  05/11/18 197 lb 12.8 oz (89.7 kg)

## 2018-07-29 ENCOUNTER — Telehealth: Payer: Self-pay | Admitting: Adult Health

## 2018-07-29 NOTE — Telephone Encounter (Signed)
Scheduled appt per 1/14 sch message - sent reminder letter in the mail for April appt and SCP

## 2018-08-03 ENCOUNTER — Encounter: Payer: Self-pay | Admitting: Radiation Oncology

## 2018-08-03 ENCOUNTER — Other Ambulatory Visit: Payer: Self-pay

## 2018-08-03 ENCOUNTER — Ambulatory Visit
Admission: RE | Admit: 2018-08-03 | Discharge: 2018-08-03 | Disposition: A | Discharge status: Home / Self Care | Payer: Medicare HMO | Source: Ambulatory Visit | Attending: Radiation Oncology | Admitting: Radiation Oncology

## 2018-08-03 DIAGNOSIS — Z79810 Long term (current) use of selective estrogen receptor modulators (SERMs): Secondary | ICD-10-CM | POA: Diagnosis not present

## 2018-08-03 DIAGNOSIS — R232 Flushing: Secondary | ICD-10-CM | POA: Insufficient documentation

## 2018-08-03 DIAGNOSIS — C50411 Malignant neoplasm of upper-outer quadrant of right female breast: Secondary | ICD-10-CM

## 2018-08-03 DIAGNOSIS — Z923 Personal history of irradiation: Secondary | ICD-10-CM | POA: Insufficient documentation

## 2018-08-03 DIAGNOSIS — Z79899 Other long term (current) drug therapy: Secondary | ICD-10-CM | POA: Diagnosis not present

## 2018-08-03 DIAGNOSIS — Z170 Estrogen receptor positive status [ER+]: Secondary | ICD-10-CM | POA: Diagnosis not present

## 2018-08-03 HISTORY — DX: Personal history of irradiation: Z92.3

## 2018-08-03 NOTE — Progress Notes (Signed)
.    Radiation Oncology         [phone] ________________________________  Name: Christine Leon [facility] MRN: [id number]  Date: 08/03/2018  DOB: July 26, 1950  Follow-Up Visit Note  Outpatient  CC: Marin Olp, MD  Rolm Bookbinder, MD  Diagnosis and Prior Radiotherapy:    ICD-10-CM   1. Malignant neoplasm of upper-outer quadrant of right breast in female, estrogen receptor positive ([place]) C50.411    Z17.0     CHIEF COMPLAINT: Here for follow-up and surveillance of breast cancer  Narrative:  The patient returns today for routine follow-up.  Doing well. On Tamoxifen, having some hot flashes. Applying Vit E lotion to right breast.                              ALLERGIES:  is allergic to gluten meal; wheat bran; and cephalexin.  Meds: Current Outpatient Medications  Medication Sig Dispense Refill  . ALPHA LIPOIC ACID PO Take 1 tablet by mouth daily.    . Cholecalciferol (VITAMIN D-3 PO) Take 1,500 Units by mouth daily.    Marland Kitchen escitalopram (LEXAPRO) 5 MG tablet Take 1 tablet (5 mg total) by mouth daily. 30 tablet 5  . fish oil-omega-3 fatty acids 1000 MG capsule Take 1 g by mouth daily.      . fluticasone (FLONASE) 50 MCG/ACT nasal spray Place 2 sprays into both nostrils daily. 16 g 6  . glucosamine-chondroitin 500-400 MG tablet Take 1 tablet by mouth daily.      Marland Kitchen levothyroxine (SYNTHROID, LEVOTHROID) 125 MCG tablet Take 125 mcg by mouth daily before breakfast.    . lisinopril-hydrochlorothiazide (PRINZIDE,ZESTORETIC) 20-25 MG tablet TAKE 1 TABLET BY MOUTH ONCE DAILY 90 tablet 3  . vitamin B-12 (CYANOCOBALAMIN) 500 MCG tablet Take 500 mcg by mouth daily.     No current facility-administered medications for this encounter.     Physical Findings: The patient is in no acute distress. Patient is alert and oriented.  height is 5' 5.5" (1.664 m) and weight is 194 lb 6.4 oz (88.2 kg). Her oral temperature is 97.5 F (36.4 C) (abnormal). Her blood pressure is 139/62 and her pulse is  67. Her respiration is 20 and oxygen saturation is 99%. .    Satisfactory skin healing in radiotherapy fields. Darkening over right breast, skin slightly dry.   Lab Findings: Lab Results  Component Value Date   WBC 4.5 04/15/2017   HGB 14.7 04/15/2017   HCT 44.3 04/15/2017   MCV 91.2 04/15/2017   PLT 271.0 04/15/2017    Radiographic Findings: No results found.  Impression/Plan: Healing well from radiotherapy to the breast tissue.  Continue skin care with topical Vitamin E Oil and / or lotion for at least 2 more months for further healing.  I encouraged her to continue with yearly mammography  and followup with medical oncology. I will see her back on an as-needed basis. I have encouraged her to call if she has any issues or concerns in the future. I wished her the very best.       Eppie Gibson, MD

## 2018-08-22 ENCOUNTER — Telehealth: Payer: Self-pay | Admitting: *Deleted

## 2018-08-22 NOTE — Telephone Encounter (Signed)
This RN returned call to this paieint per her call stating " I am not able to tolerate how this tamoxifen is making me feel "  " I think I need to be swithched to another medicaition"  Per contact pt states she started the tamoxifen on the 15th of January with noted onset of hot flashes " like one big one that last all day "  This RN discussed above symptom will likely occur whether MD prescribes tamoxifen or an aromatase inhibitor "  She states she is having sporadic welts with itching.  She denies any SOB, throat discomfort or feelings of swelling in throat or tongue.  Per above discussion RN gave recommendation for pt to stop the tamoxifen at this time.  She may take benadryl for symptoms ( stated to be aware of drowsiness ) and then to call this RN next week with symptom up date for further recommendations.

## 2018-08-30 ENCOUNTER — Telehealth: Payer: Self-pay | Admitting: *Deleted

## 2018-08-30 NOTE — Telephone Encounter (Signed)
This RN spoke with pt per her call to follow up from phone discussion on 08/22/2018 regarding side effects with tamoxifen.  Christine Leon has been off tamoxifen now for over a week with resolution of welts and itching.  Hot flashes have subsided as well.  Per above - plan is to review with MD for either visit to discuss or have pt start an AI and follow up as scheduled in April.

## 2018-08-31 ENCOUNTER — Telehealth: Payer: Self-pay

## 2018-08-31 MED ORDER — ANASTROZOLE 1 MG PO TABS: 1.0000 mg | ORAL_TABLET | Freq: Every day | ORAL | 2 refills | Status: DC

## 2018-08-31 NOTE — Telephone Encounter (Signed)
Nurse spoke with patient regarding previous telephone call.    Dr. Virgie Dad recommendations are for patient to try Anastrozole.  Nurse reviewed possible side effects of medication.  Patient voiced understanding.  No further needs at this time.

## 2018-10-19 ENCOUNTER — Other Ambulatory Visit: Payer: Self-pay

## 2018-10-19 ENCOUNTER — Telehealth: Payer: Self-pay

## 2018-10-19 NOTE — Telephone Encounter (Signed)
Pt lisinopril/HCTZ is on back order. Pt advised that the Rx will be send in separate and not as a combo pill.

## 2018-10-21 ENCOUNTER — Telehealth: Payer: Self-pay | Admitting: Oncology

## 2018-10-21 NOTE — Telephone Encounter (Signed)
Spoke with patient and she already had the webex app downloaded. Sent her the email with a join link and she confirmed receiving it.

## 2018-10-25 NOTE — Progress Notes (Signed)
[facility]  [phone] [phone]    ID: Christine Leon DOB: Oct 15, 1950  MR#: [id number]  JYN#:[id number]  Patient Care Team: Marin Olp, MD as PCP - General (Family Medicine) Rolm Bookbinder, MD as Consulting Physician (General Surgery) Bertis Hustead, Virgie Dad, MD as Consulting Physician (Oncology) Eppie Gibson, MD as Attending Physician (Radiation Oncology) Armbruster, Carlota Raspberry, MD as Consulting Physician (Gastroenterology) OTHER MD:   CHIEF COMPLAINT: Estrogen receptor positive breast cancer  CURRENT TREATMENT: anastrozole   I connected with Christine Leon on 10/26/18 at  1:00 PM EDT by video enabled telemedicine visit and verified that I am speaking with the correct person using two identifiers.   I discussed the limitations, risks, security and privacy concerns of performing an evaluation and management service by telemedicine and the availability of in-person appointments. I also discussed with the patient that there may be a patient responsible charge related to this service. The patient expressed understanding and agreed to proceed.   Other persons participating in the visit and their role in the encounter:   - [place]   Patient's location: home  Provider's location: [place]   Chief Complaint: estrogen receptor positive breast cancer    HISTORY OF CURRENT ILLNESS: From the original intake note:  Christine Leon "Christine Leon" had previous mammography in 2016 showing distortion of the right breast with benign findings along with a 19-monthfollow up. She did not have further mammography until she had bilateral diagnostic mammography with tomography and right breast ultrasonography at [facility] showing: breast density category B. There was a suspicious mass in the right breast upper outer quadrant at 12 o'clock measuring 0.5 x 0.4 x 0.4 cm. There were multiple  adjacent cysts. There was no sonographic evidence of abnormal lymph nodes or adenopathy.   Accordingly on 03/30/2018 she proceeded to biopsy of the right breast area in question. The pathology from this procedure showed (([id number]: Invasive ductal carcinoma, grade I. Prognostic indicators significant for: estrogen receptor, 100% positive and progesterone receptor, 80% positive, both with string staining intensity. Proliferation marker Ki67 at 3%. HER2 negative with an immunohistochemistry of (1+).  The patient's subsequent history is as detailed below.   INTERVAL HISTORY: PJahniyahreturns today for follow-up and treatment of her estrogen receptor positive breast cancer.   She continues on anastrozole. She notes that she has horrible hot flashes, with diaphoresis. She was prescribed lexipro to treat, but she has some difficulty sleeping if she takes it at night. She has never taken gabapentin. She has not noticed any complications with vaginal dryness.  Since her last visit here, she has not undergone any additional studies.     REVIEW OF SYSTEMS: [name] been working out in her yard, primarily gardening. She notes some tenderness in her breast where the cancer was. She has noticed that her nipple is very tender and she has some discomfort up through where they removed the lymph nodes. The pain does not hurt constantly. She describes the pain as throbbing rather than stabbing. She has had no swelling in her arm. She notes that her husband is doing quite well considering some past medical difficulties.   The patient denies unusual headaches, visual changes, nausea, vomiting, or dizziness. There has been no unusual cough, phlegm production, or pleurisy. This been no change in bowel or bladder habits. The patient denies unexplained fatigue or unexplained weight loss, bleeding, rash, or fever. A detailed review of systems was  otherwise noncontributory.    PAST MEDICAL HISTORY: Past Medical  History:  Diagnosis Date  . Allergy   . Celiac sprue   . History of radiation therapy 05/26/18- 06/29/18   Right Breast 15 fractions for a total dose of 40.05 Gy. Right Breast boost 5 fractions for a total dose of 10 Gy  . Hypertension   . Hypothyroidism   . PONV (postoperative nausea and vomiting)     PAST SURGICAL HISTORY: Past Surgical History:  Procedure Laterality Date  . ABDOMINAL HYSTERECTOMY     in 32s. fibroids. including cervix removal. per patient was told no further pap smears after.   Marland Kitchen arthroscopic knee surgery     2015 under Dr. Theda Sers  . BREAST LUMPECTOMY WITH RADIOACTIVE SEED AND SENTINEL LYMPH NODE BIOPSY Right 04/27/2018   Procedure: RIGHT BREAST LUMPECTOMY WITH RADIOACTIVE SEED AND RIGHT AXILLARYSENTINEL LYMPH NODE BIOPSY ERAS PATHWAY;  Surgeon: Rolm Bookbinder, MD;  Location: [facility];  Service: General;  Laterality: Right;  PECTORAL BLOCK  . CHOLECYSTECTOMY    . COCCYX REMOVAL     after cheerleading accident  . OOPHORECTOMY     in 75s  . TONSILLECTOMY    Hysterectomy with BSO (fibroids)   FAMILY HISTORY Family History  Problem Relation Age of Onset  . Hypertension Mother   . Coronary artery disease Father        20  . COPD Father        passed age 59  . Breast cancer Maternal Aunt    The patient's father died at age 53 due to COPD and heart valve issues. The patient's mother is alive at age 78. The patient had 1 brother (deceased) and no sisters. There was a maternal aunt who was diagnosed with breast cancer in the 26's. The patient' denies any other family members with breast, ovarian, or other cancers.  The patient does not meet criteria for genetics testing   GYNECOLOGIC HISTORY:  No LMP recorded (lmp unknown). Patient has had a hysterectomy. Menarche: [age] years old Age at first live birth: 68 years old  She is GX P2. She is status post total hysterectomy with BSO in 1995. Her LMP was in her late 58's. She took HRT for a few  years and recently discontinued.     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 Her husband, [name], is retired from working in Scientist, physiological for [employer]. [name] recently suffered from a medical incident and Christine Leon has been caring for him. The patient's oldest, a son named [name], lives in [place] and works as a 5th Land. The patient's youngest, a daughter named [name], lives in [place] and works as a Print production planner. The patient has 4 grandchildren. The patient is not a church attender   ADVANCED DIRECTIVES: Her husband recently suffered a medical incident, so she plans on naming her son, Astou Lada, as Medical Power of Attorney.     HEALTH MAINTENANCE: Social History   Tobacco Use  . Smoking status: Former Smoker    Packs/day: 1.00    Years: 39.00    Pack years: 39.00    Types: Cigarettes    Last attempt to quit: 07/13/2006    Years since quitting: 12.2  . Smokeless tobacco: Never Used  Substance Use Topics  . Alcohol use: Yes    Alcohol/week: 1.0 standard drinks    Types: 1 Standard drinks or equivalent per week  . Drug use: No  Colonoscopy: 2018/ Dr. Havery Moros (repeat in 2 years)  PAP:  Bone density: scheduled for 2019   Allergies  Allergen Reactions  . Gluten Meal     Has celic  . Wheat Bran     Has celic   . Cephalexin Hives    Current Outpatient Medications  Medication Sig Dispense Refill  . ALPHA LIPOIC ACID PO Take 1 tablet by mouth daily.    Marland Kitchen anastrozole (ARIMIDEX) 1 MG tablet Take 1 tablet (1 mg total) by mouth daily. 30 tablet 2  . Cholecalciferol (VITAMIN D-3 PO) Take 1,500 Units by mouth daily.    Marland Kitchen escitalopram (LEXAPRO) 5 MG tablet Take 1 tablet (5 mg total) by mouth daily. 30 tablet 5  . fish oil-omega-3 fatty acids 1000 MG capsule Take 1 g by mouth daily.      . fluticasone (FLONASE) 50 MCG/ACT nasal spray Place 2 sprays into both nostrils daily. 16 g 6  . glucosamine-chondroitin  500-400 MG tablet Take 1 tablet by mouth daily.      . hydrochlorothiazide (HYDRODIURIL) 25 MG tablet Take 1 tablet (25 mg total) by mouth daily. 90 tablet 3  . levothyroxine (SYNTHROID, LEVOTHROID) 125 MCG tablet Take 125 mcg by mouth daily before breakfast.    . lisinopril (PRINIVIL,ZESTRIL) 20 MG tablet Take 1 tablet (20 mg total) by mouth daily. 90 tablet 3  . vitamin B-12 (CYANOCOBALAMIN) 500 MCG tablet Take 500 mcg by mouth daily.     No current facility-administered medications for this visit.     OBJECTIVE: Middle-aged white woman in no acute distress  There were no vitals filed for this visit.   There is no height or weight on file to calculate BMI.   Wt Readings from Last 3 Encounters:  08/03/18 194 lb 6.4 oz (88.2 kg)  06/27/18 193 lb 12.8 oz (87.9 kg)  05/11/18 197 lb 12.8 oz (89.7 kg)      ECOG FS:1 - Symptomatic but completely ambulatory  She has excellent range of motion in the surgical arm and no lymphedema  LAB RESULTS:  CMP     Component Value Date/Time   NA 142 04/22/2018 1300   K 3.5 04/22/2018 1300   CL 106 04/22/2018 1300   CO2 27 04/22/2018 1300   GLUCOSE 110 (H) 04/22/2018 1300   BUN 13 04/22/2018 1300   CREATININE 0.82 04/22/2018 1300   CALCIUM 9.3 04/22/2018 1300   PROT 7.3 04/15/2017 1039   ALBUMIN 4.6 04/15/2017 1039   AST 32 04/15/2017 1039   ALT 55 (H) 04/15/2017 1039   ALKPHOS 60 04/15/2017 1039   BILITOT 0.8 04/15/2017 1039   GFRNONAA >60 04/22/2018 1300   GFRAA >60 04/22/2018 1300    No results found for: TOTALPROTELP, ALBUMINELP, A1GS, A2GS, BETS, BETA2SER, GAMS, MSPIKE, SPEI  No results found for: KPAFRELGTCHN, LAMBDASER, KAPLAMBRATIO  Lab Results  Component Value Date   WBC 4.5 04/15/2017   NEUTROABS 4.6 12/27/2016   HGB 14.7 04/15/2017   HCT 44.3 04/15/2017   MCV 91.2 04/15/2017   PLT 271.0 04/15/2017    _0 @  No results found for: LABCA2  No components found for: [id number]  No results for input(s): INR  in the last 168 hours.  No results found for: LABCA2  No results found for: ITG549  No results found for: IYM415  No results found for: AXE940  No results found for: CA2729  No components found for: HGQUANT  No results found for: CEA1 / No results found for: CEA1  No results found for: AFPTUMOR  No results found for: CHROMOGRNA  No results found for: PSA1  No visits with results within 3 Day(s) from this visit.  Latest known visit with results is:  Admission on 04/27/2018, Discharged on 04/27/2018  Component Date Value Ref Range Status  . Sodium 04/22/2018 142  135 - 145 mmol/L Final  . Potassium 04/22/2018 3.5  3.5 - 5.1 mmol/L Final  . Chloride 04/22/2018 106  98 - 111 mmol/L Final  . CO2 04/22/2018 27  22 - 32 mmol/L Final  . Glucose, Bld 04/22/2018 110* 70 - 99 mg/dL Final  . BUN 04/22/2018 13  8 - 23 mg/dL Final  . Creatinine, Ser 04/22/2018 0.82  0.44 - 1.00 mg/dL Final  . Calcium 04/22/2018 9.3  8.9 - 10.3 mg/dL Final  . GFR calc non Af Amer 04/22/2018 >60  >60 mL/min Final  . GFR calc Af Amer 04/22/2018 >60  >60 mL/min Final   Comment: (NOTE) The eGFR has been calculated using the CKD EPI equation. This calculation has not been validated in all clinical situations. eGFR's persistently <60 mL/min signify possible Chronic Kidney Disease.   Georgiann Hahn gap 04/22/2018 9  5 - 15 Final   Performed at [facility], [place] [address], [place], [place] [address]    (this displays the last labs from the last 3 days)  No results found for: TOTALPROTELP, ALBUMINELP, A1GS, A2GS, BETS, BETA2SER, GAMS, MSPIKE, SPEI (this displays SPEP labs)  No results found for: KPAFRELGTCHN, LAMBDASER, KAPLAMBRATIO (kappa/lambda light chains)  No results found for: HGBA, HGBA2QUANT, HGBFQUANT, HGBSQUAN (Hemoglobinopathy evaluation)   No results found for: LDH  No results found for: IRON, TIBC, IRONPCTSAT (Iron and TIBC)  No results found for: FERRITIN  Urinalysis     Component Value Date/Time   COLORURINE YELLOW 12/27/2016 0626   APPEARANCEUR CLEAR 12/27/2016 0626   LABSPEC 1.006 12/27/2016 0626   PHURINE 7.0 12/27/2016 0626   GLUCOSEU NEGATIVE 12/27/2016 0626   HGBUR NEGATIVE 12/27/2016 0626   BILIRUBINUR Negative 04/15/2017 1047   KETONESUR NEGATIVE 12/27/2016 0626   PROTEINUR Negative 04/15/2017 1047   PROTEINUR NEGATIVE 12/27/2016 0626   UROBILINOGEN 0.2 04/15/2017 1047   NITRITE Negative 04/15/2017 1047   NITRITE NEGATIVE 12/27/2016 0626   LEUKOCYTESUR Moderate (2+) (A) 04/15/2017 1047     STUDIES: No results found.   ELIGIBLE FOR AVAILABLE RESEARCH PROTOCOL: no   ASSESSMENT: 68 y.o. [place] Ser, [place] woman status post right breast upper outer quadrant biopsy 03/30/2018 for a clinical T1a N0, stage IA invasive ductal carcinoma, grade 1, estrogen and progesterone receptor positive, HER-2 not amplified, with an MIB-1 of 3%.  (1) status post right lumpectomy and sentinel lymph node sampling 04/27/2018 for a pT1c pN0, stage IA invasive ductal carcinoma, grade 1, with negative margins  (a) 1 sentinel lymph node was removed  (2) Oncotype DX score of 17 predicts a risk of recurrence outside the breast over 9 years of 5% if the patient's only systemic therapy is an antiestrogen for 5 years.  It also predicts no benefit from chemotherapy  (3) adjuvant radiation completed 06/28/2018  (4) started tamoxifen 07/27/2017--discontinued secondary to rash  (5) anastrozole started 08/31/2018   PLAN: Christine Leon is tolerating anastrozole generally well, with hot flashes being the major problem.  She is taking Lexapro which is helping a little, but not enough.  The hot flashes are particularly bothersome at night.  I suggested she move the Lexapro to her morning dose and I will give her  a dose of gabapentin 300 mg at bedtime.  That should help her fall asleep but it should significantly cut down on the nighttime hot flashes.  Hopefully over the next several  months the hot flash issue will ameliorate  She will be due for repeat mammography in September and I will add a bone density at that time  She will see me early October.  She knows to call for any other issues that may develop before the next visit.    Akim Watkinson, Virgie Dad, MD  10/26/18 1:27 PM Medical Oncology and Hematology [facility] [address] [place], [place] [address] Tel. [phone]    Fax. [phone]  I, Jacqualyn Posey am acting as a Education administrator for Chauncey Cruel, MD.   I, Lurline Del MD, have reviewed the above documentation for accuracy and completeness, and I agree with the above.

## 2018-10-26 ENCOUNTER — Inpatient Hospital Stay: Payer: Medicare HMO | Attending: Oncology | Admitting: Oncology

## 2018-10-26 ENCOUNTER — Other Ambulatory Visit: Payer: Medicare HMO

## 2018-10-26 ENCOUNTER — Telehealth: Payer: Self-pay | Admitting: Family Medicine

## 2018-10-26 ENCOUNTER — Other Ambulatory Visit: Payer: Self-pay

## 2018-10-26 DIAGNOSIS — Z79810 Long term (current) use of selective estrogen receptor modulators (SERMs): Secondary | ICD-10-CM | POA: Diagnosis not present

## 2018-10-26 DIAGNOSIS — Z170 Estrogen receptor positive status [ER+]: Secondary | ICD-10-CM

## 2018-10-26 DIAGNOSIS — C50411 Malignant neoplasm of upper-outer quadrant of right female breast: Secondary | ICD-10-CM | POA: Diagnosis not present

## 2018-10-26 DIAGNOSIS — Z923 Personal history of irradiation: Secondary | ICD-10-CM | POA: Diagnosis not present

## 2018-10-26 MED ORDER — ANASTROZOLE 1 MG PO TABS: 1.0000 mg | ORAL_TABLET | Freq: Every day | ORAL | 2 refills | Status: DC

## 2018-10-26 MED ORDER — GABAPENTIN 300 MG PO CAPS: 300.0000 mg | ORAL_CAPSULE | Freq: Every day | ORAL | 4 refills | Status: DC

## 2018-10-26 MED ORDER — LISINOPRIL 20 MG PO TABS: 20.0000 mg | ORAL_TABLET | Freq: Every day | ORAL | 3 refills | Status: DC

## 2018-10-26 MED ORDER — HYDROCHLOROTHIAZIDE 25 MG PO TABS: 25.0000 mg | ORAL_TABLET | Freq: Every day | ORAL | 3 refills | Status: DC

## 2018-10-26 NOTE — Telephone Encounter (Signed)
See request °

## 2018-10-26 NOTE — Telephone Encounter (Signed)
Patient called because her Lisinopril HCTZ has not been sent in like she was told. She is frustrated that it is taking this long and she is aware of the back order so that it should be separate prescriptions per previous telephone note. Please advise patient [phone] (mobile)

## 2018-10-26 NOTE — Telephone Encounter (Signed)
Rx sent in Lisinopril 20MG #90/3 HCTZ 25MG  #90/3

## 2018-10-27 NOTE — Telephone Encounter (Signed)
Ok.  Yes, I called her yesterday to apoloigize for the wait and inform her that It was sent yesterday.

## 2018-10-27 NOTE — Progress Notes (Signed)
Called order to [facility] pharmacy voicemail at this time.

## 2018-10-27 NOTE — Telephone Encounter (Signed)
I know we discussed this yesterday and it looks like meds were sent in- just for future reference- please add on what has been done on the phone note like this- "discussed with Dr. Yong Channel, sent in separate rx for lisinopril and hctz, patient informed". Was the patient informed?

## 2018-10-31 ENCOUNTER — Other Ambulatory Visit: Payer: Self-pay | Admitting: *Deleted

## 2018-10-31 MED ORDER — BENAZEPRIL HCL 20 MG PO TABS: 20.0000 mg | ORAL_TABLET | Freq: Every day | ORAL | 1 refills | Status: DC

## 2018-10-31 NOTE — Progress Notes (Signed)
Spoke to pt told her new Rx for Benazepril 20 mg was [facility] to [facility] to replace the Lisinopril that was on backorder. Pt verbalized understanding and said she already picked up Lisinopril from [facility] they contacted her and said they had it. Told her okay.

## 2018-11-16 ENCOUNTER — Telehealth: Payer: Self-pay

## 2018-11-16 MED ORDER — LISINOPRIL-HYDROCHLOROTHIAZIDE 20-25 MG PO TABS: 1.0000 | ORAL_TABLET | Freq: Every day | ORAL | 3 refills | Status: DC

## 2018-11-16 NOTE — Telephone Encounter (Signed)
[facility] message stating that they need a combo Rx on file since they split pt rx due to being on back order. Combo Rx sent to pharmacy electronically. Pt has been scheduled for f/u HTN visit 11/18/2018. No further action needed!

## 2018-11-18 ENCOUNTER — Ambulatory Visit (INDEPENDENT_AMBULATORY_CARE_PROVIDER_SITE_OTHER): Payer: Medicare HMO | Admitting: Family Medicine

## 2018-11-18 ENCOUNTER — Encounter: Payer: Self-pay | Admitting: Family Medicine

## 2018-11-18 VITALS — BP 118/79 | HR 73 | Ht 65.5 in | Wt 193.0 lb

## 2018-11-18 DIAGNOSIS — I700 Atherosclerosis of aorta: Secondary | ICD-10-CM | POA: Diagnosis not present

## 2018-11-18 DIAGNOSIS — R232 Flushing: Secondary | ICD-10-CM | POA: Diagnosis not present

## 2018-11-18 DIAGNOSIS — I10 Essential (primary) hypertension: Secondary | ICD-10-CM

## 2018-11-18 DIAGNOSIS — E034 Atrophy of thyroid (acquired): Secondary | ICD-10-CM

## 2018-11-18 MED ORDER — LEVOTHYROXINE SODIUM 125 MCG PO TABS: 125.0000 ug | ORAL_TABLET | Freq: Every day | ORAL | 3 refills | Status: DC

## 2018-11-18 MED ORDER — GABAPENTIN 100 MG PO CAPS: 100.0000 mg | ORAL_CAPSULE | Freq: Two times a day (BID) | ORAL | 3 refills | Status: DC

## 2018-11-18 NOTE — Patient Instructions (Addendum)
Health Maintenance Due  Topic Date Due  . DEXA SCAN- when routine imaging is allowed we will need to schedule this-discussed next in person visit 05/21/2015   Video visit

## 2018-11-18 NOTE — Progress Notes (Addendum)
Phone [phone]   Subjective:  Virtual visit via Video note. Chief complaint: Chief Complaint  Patient presents with  . Hypertension    Follow up   This visit type was conducted due to national recommendations for restrictions regarding the COVID-19 Pandemic (e.g. social distancing).  This format is felt to be most appropriate for this patient at this time balancing risks to patient and risks to population by having him in for in person visit.  No physical exam was performed (except for noted visual exam or audio findings with Telehealth visits).    Our team/I connected with Nalee Lightle Najjar on 11/18/18 at 11:20 AM EDT by a video enabled telemedicine application ([email or web address]) and verified that I am speaking with the correct person using two identifiers.  Location patient: Home[place] Location provider: [facility], office Persons participating in the virtual visit:  patient  Our team/I discussed the limitations of evaluation and management by telemedicine and the availability of in person appointments. In light of current covid-19 pandemic, patient also understands that we are trying to protect them by minimizing in office contact if at all possible.  The patient expressed consent for telemedicine visit and agreed to proceed. Patient understands insurance will be billed.   ROS- no fever/chills/new cough (some cough and sneeze with allergies- better since we have had some rain)/congestion. No shortness of breath  or chest pain  Past Medical History-  Patient Active Problem List   Diagnosis Date Noted  . Hot flashes 08/15/2014    Priority: Medium  . Obesity 08/15/2014    Priority: Medium  . Hypothyroidism 07/19/2014    Priority: Medium  . CELIAC SPRUE 04/20/2008    Priority: Medium  . Essential hypertension 06/05/2007    Priority: Medium  . Retinal tear 08/15/2014    Priority: Low  . Left knee pain 07/25/2014    Priority: Low  . Allergic rhinitis 07/20/2007    Priority: Low  .  Malignant neoplasm of upper-outer quadrant of right breast in female, estrogen receptor positive ([place]) 04/22/2018  . History of adenomatous polyp of colon 06/07/2017  . Aortic atherosclerosis ([place]) 04/15/2017    Medications- reviewed and updated Current Outpatient Medications  Medication Sig Dispense Refill  . ALPHA LIPOIC ACID PO Take 1 tablet by mouth daily.    Marland Kitchen anastrozole (ARIMIDEX) 1 MG tablet Take 1 tablet (1 mg total) by mouth daily. 30 tablet 2  . Cholecalciferol (VITAMIN D-3 PO) Take 1,500 Units by mouth daily.    Marland Kitchen escitalopram (LEXAPRO) 5 MG tablet Take 1 tablet (5 mg total) by mouth daily. 30 tablet 5  . fish oil-omega-3 fatty acids 1000 MG capsule Take 1 g by mouth daily.      . fluticasone (FLONASE) 50 MCG/ACT nasal spray Place 2 sprays into both nostrils daily. 16 g 6  . gabapentin (NEURONTIN) 300 MG capsule Take 1 capsule (300 mg total) by mouth at bedtime. 90 capsule 4  . glucosamine-chondroitin 500-400 MG tablet Take 1 tablet by mouth daily.      Marland Kitchen levothyroxine (SYNTHROID) 125 MCG tablet Take 1 tablet (125 mcg total) by mouth daily before breakfast. 90 tablet 3  . lisinopril-hydrochlorothiazide (ZESTORETIC) 20-25 MG tablet Take 1 tablet by mouth daily. 90 tablet 3  . vitamin B-12 (CYANOCOBALAMIN) 500 MCG tablet Take 500 mcg by mouth daily.    Marland Kitchen gabapentin (NEURONTIN) 100 MG capsule Take 1 capsule (100 mg total) by mouth 2 (two) times daily. Then take 300 mg before bed (rx from Dr. Jana Hakim) 180  capsule 3   No current facility-administered medications for this visit.      Objective:  BP 118/79 (BP Location: Right Arm, Patient Position: Sitting, Cuff Size: Normal)   Pulse 73   Ht 5' 5.5" (1.664 m)   Wt 193 lb (87.5 kg)   LMP  (LMP Unknown)   BMI 31.63 kg/m  self reported vitals Gen: NAD, resting comfortably Lungs: nonlabored, normal respiratory rate  Skin: appears dry, no obvious rash    Assessment and Plan   #hypertension S: controlled on lisinopril hctz  20-25 mg (we had sent in separate rx but she ultimately did not need this) BP Readings from Last 3 Encounters:  11/18/18 118/79  08/03/18 139/62  06/27/18 (!) 144/67  A/P: Stable. Continue current medications.  Patient will schedule lab visit for next week  #hypothyroidism S: On thyroid medication-leothyroxine 125 mcg- sees Dr. Chalmers Cater- missed appointment with Dr. Chalmers Cater recently due to husband illness  She states her levothyroxine has been stable for years and she would like to transition to primary care for providing this medication A/P: I have refilled levothyroxine at 125 mcg.  We will check her TSH when she comes back for labs  #Hot flashes S:Dr. Magrinat had her take lexapro during the day and gabapentin at night- seems to be helping with hot flashes and she is able to sleep. Sometimes skips lexapro and hot flashes are only mild- she feels dulled on it.    A/P: She feels like she gets more benefit from gabapentin and Lexapro- she feels dulled on the Lexapro and would prefer alternate option.  We are going to try low-dose gabapentin during the daytime-if this makes her too fatigued we can certainly switch back.  Can use up to 100 mg twice a day during the day and continue 300 mg from Dr. Jana Hakim at night   Aortic atherosclerosis [facility]) S: Noted on prior chest CT A/P: Suspect stable-continue risk factor modification with blood pressure and lipid control-she will come back for updated lipid panel.  would target LDL at least under 100   She will call to schedule lab visit.  October 8 for later for physical- admin team will call to schedule Future Appointments  Date Time Provider [place]  01/27/2019 10:00 AM Gardenia Phlegm, NP [facility] None  04/06/2019  1:00 PM [facility] LAB 2 [facility] None  04/06/2019  1:30 PM Magrinat, Virgie Dad, MD [facility] None   Lab/Order associations: Hypothyroidism due to acquired atrophy of thyroid - Plan: TSH  Essential hypertension -  Plan: CBC, Comprehensive metabolic panel, Lipid panel  Hot flashes  Meds ordered this encounter  Medications  . gabapentin (NEURONTIN) 100 MG capsule    Sig: Take 1 capsule (100 mg total) by mouth 2 (two) times daily. Then take 300 mg before bed (rx from Dr. Jana Hakim)    Dispense:  180 capsule    Refill:  3  . levothyroxine (SYNTHROID) 125 MCG tablet    Sig: Take 1 tablet (125 mcg total) by mouth daily before breakfast.    Dispense:  90 tablet    Refill:  3   Return precautions advised.  Garret Reddish, MD

## 2018-11-18 NOTE — Assessment & Plan Note (Signed)
S: Noted on prior chest CT A/P: Suspect stable-continue risk factor modification with blood pressure and lipid control-she will come back for updated lipid panel.  would target LDL at least under 100

## 2018-11-24 ENCOUNTER — Other Ambulatory Visit: Payer: Self-pay

## 2018-11-24 ENCOUNTER — Other Ambulatory Visit (INDEPENDENT_AMBULATORY_CARE_PROVIDER_SITE_OTHER): Payer: Medicare HMO

## 2018-11-24 DIAGNOSIS — E034 Atrophy of thyroid (acquired): Secondary | ICD-10-CM

## 2018-11-24 DIAGNOSIS — I10 Essential (primary) hypertension: Secondary | ICD-10-CM | POA: Diagnosis not present

## 2018-11-24 LAB — COMPREHENSIVE METABOLIC PANEL
ALT: 33 U/L (ref 0–35)
AST: 26 U/L (ref 0–37)
Albumin: 4.1 g/dL (ref 3.5–5.2)
Alkaline Phosphatase: 58 U/L (ref 39–117)
BUN: 15 mg/dL (ref 6–23)
CO2: 30 mEq/L (ref 19–32)
Calcium: 9.1 mg/dL (ref 8.4–10.5)
Chloride: 103 mEq/L (ref 96–112)
Creatinine, Ser: 0.86 mg/dL (ref 0.40–1.20)
GFR: 65.52 mL/min (ref >60.00)
Glucose, Bld: 92 mg/dL (ref 70–99)
Potassium: 3.8 mEq/L (ref 3.5–5.1)
Sodium: 142 mEq/L (ref 135–145)
Total Bilirubin: 0.6 mg/dL (ref 0.2–1.2)
Total Protein: 6.3 g/dL (ref 6.0–8.3)

## 2018-11-24 LAB — LIPID PANEL
Cholesterol: 161 mg/dL (ref 0–200)
HDL: 50 mg/dL (ref >39.00)
LDL Cholesterol: 94 mg/dL (ref 0–99)
NonHDL: 111.19
Total CHOL/HDL Ratio: 3
Triglycerides: 84 mg/dL (ref 0.0–149.0)
VLDL: 16.8 mg/dL (ref 0.0–40.0)

## 2018-11-24 LAB — CBC
HCT: 41.3 % (ref 36.0–46.0)
Hemoglobin: 14.2 g/dL (ref 12.0–15.0)
MCHC: 34.4 g/dL (ref 30.0–36.0)
MCV: 90.7 fl (ref 78.0–100.0)
Platelets: 146 10*3/uL — ABNORMAL LOW (ref 150.0–400.0)
RBC: 4.56 Mil/uL (ref 3.87–5.11)
RDW: 14.1 % (ref 11.5–15.5)
WBC: 4.5 10*3/uL (ref 4.0–10.5)

## 2018-11-24 LAB — TSH: TSH: 0.33 u[IU]/mL — ABNORMAL LOW (ref 0.35–4.50)

## 2018-11-25 ENCOUNTER — Other Ambulatory Visit: Payer: Self-pay

## 2018-11-25 DIAGNOSIS — E034 Atrophy of thyroid (acquired): Secondary | ICD-10-CM

## 2019-01-05 ENCOUNTER — Telehealth: Payer: Self-pay | Admitting: Oncology

## 2019-01-05 NOTE — Telephone Encounter (Signed)
I talk with patient regarding visit she will try to become active on my chart

## 2019-01-27 ENCOUNTER — Encounter: Payer: Medicare HMO | Admitting: Adult Health

## 2019-03-01 ENCOUNTER — Other Ambulatory Visit: Payer: Self-pay | Admitting: Oncology

## 2019-04-05 ENCOUNTER — Ambulatory Visit (INDEPENDENT_AMBULATORY_CARE_PROVIDER_SITE_OTHER): Payer: Medicare HMO | Admitting: Physician Assistant

## 2019-04-05 ENCOUNTER — Telehealth: Payer: Self-pay | Admitting: Family Medicine

## 2019-04-05 ENCOUNTER — Encounter: Payer: Self-pay | Admitting: Physician Assistant

## 2019-04-05 DIAGNOSIS — R42 Dizziness and giddiness: Secondary | ICD-10-CM | POA: Diagnosis not present

## 2019-04-05 NOTE — Progress Notes (Signed)
Virtual Visit via Video   I connected with Christine Leon on 04/05/19 at  3:40 PM EDT by a video enabled telemedicine application and verified that I am speaking with the correct person using two identifiers. Location patient: Home Location provider: [facility], Office Persons participating in the virtual visit: Cherrill Scrima Polak, Inda Coke PA-C, Anselmo Pickler, LPN   I discussed the limitations of evaluation and management by telemedicine and the availability of in person appointments. The patient expressed understanding and agreed to proceed.  I acted as a Education administrator for [employer], PA-C [employer], LPN  Subjective:   HPI:   Dizziness Pt c/o dizziness and occasional nausea x one week, ever since she was on a boat ride last week. Hx of vertigo in the past, feels the same, but lasting longer than normal. Has tried exercises she found online but cannot tolerate them.  Denies: vomiting, blurred vision, double vision, HA, chest pain, SOB   ROS: See pertinent positives and negatives per HPI.  Patient Active Problem List   Diagnosis Date Noted  . Malignant neoplasm of upper-outer quadrant of right breast in female, estrogen receptor positive ([place]) 04/22/2018  . History of adenomatous polyp of colon 06/07/2017  . Aortic atherosclerosis ([place]) 04/15/2017  . Hot flashes 08/15/2014  . Retinal tear 08/15/2014  . Obesity 08/15/2014  . Left knee pain 07/25/2014  . Hypothyroidism 07/19/2014  . CELIAC SPRUE 04/20/2008  . Allergic rhinitis 07/20/2007  . Essential hypertension 06/05/2007    Social History   Tobacco Use  . Smoking status: Former Smoker    Packs/day: 1.00    Years: 39.00    Pack years: 39.00    Types: Cigarettes    Quit date: 07/13/2006    Years since quitting: 12.7  . Smokeless tobacco: Never Used  Substance Use Topics  . Alcohol use: Yes    Alcohol/week: 1.0 standard drinks    Types: 1 Standard drinks or equivalent per week    Current  Outpatient Medications:  .  ALPHA LIPOIC ACID PO, Take 1 tablet by mouth daily., Disp: , Rfl:  .  anastrozole (ARIMIDEX) 1 MG tablet, TAKE 1 TABLET BY MOUTH EVERY DAY, Disp: 90 tablet, Rfl: 0 .  Cholecalciferol (VITAMIN D-3 PO), Take 1,500 Units by mouth daily., Disp: , Rfl:  .  escitalopram (LEXAPRO) 5 MG tablet, Take 1 tablet (5 mg total) by mouth daily., Disp: 30 tablet, Rfl: 5 .  fish oil-omega-3 fatty acids 1000 MG capsule, Take 1 g by mouth daily.  , Disp: , Rfl:  .  fluticasone (FLONASE) 50 MCG/ACT nasal spray, Place 2 sprays into both nostrils daily., Disp: 16 g, Rfl: 6 .  gabapentin (NEURONTIN) 100 MG capsule, Take 1 capsule (100 mg total) by mouth 2 (two) times daily. Then take 300 mg before bed (rx from Dr. Jana Hakim), Disp: 180 capsule, Rfl: 3 .  gabapentin (NEURONTIN) 300 MG capsule, Take 1 capsule (300 mg total) by mouth at bedtime., Disp: 90 capsule, Rfl: 4 .  glucosamine-chondroitin 500-400 MG tablet, Take 1 tablet by mouth daily.  , Disp: , Rfl:  .  levothyroxine (SYNTHROID) 125 MCG tablet, Take 1 tablet (125 mcg total) by mouth daily before breakfast., Disp: 90 tablet, Rfl: 3 .  lisinopril-hydrochlorothiazide (ZESTORETIC) 20-25 MG tablet, Take 1 tablet by mouth daily., Disp: 90 tablet, Rfl: 3 .  vitamin B-12 (CYANOCOBALAMIN) 500 MCG tablet, Take 500 mcg by mouth daily., Disp: , Rfl:   Allergies  Allergen Reactions  . Gluten Meal  Has celic  . Wheat Bran     Has celic   . Cephalexin Hives    Objective:   VITALS: Per patient if applicable, see vitals. GENERAL: Alert, appears well and in no acute distress. HEENT: Atraumatic, conjunctiva clear, no obvious abnormalities on inspection of external nose and ears. NECK: Normal movements of the head and neck. CARDIOPULMONARY: No increased WOB. Speaking in clear sentences. I:E ratio WNL.  MS: Moves all visible extremities without noticeable abnormality. PSYCH: Pleasant and cooperative, well-groomed. Speech normal rate and  rhythm. Affect is appropriate. Insight and judgement are appropriate. Attention is focused, linear, and appropriate.  NEURO: CN grossly intact. Oriented as arrived to appointment on time with no prompting. Moves both UE equally.  SKIN: No obvious lesions, wounds, erythema, or cyanosis noted on face or hands.  Assessment and Plan:   Shyanna was seen today for dizziness.  Diagnoses and all orders for this visit:  Dizziness No red flags on discussion with patient.  Will refer her to physical therapy urgently for vestibular rehab.  She verbalized understanding to plan.  I recommended if she develops any of the warning flags that we went over, including strokelike symptoms, changes in vision, etc to go to the ER immediately.  Patient verbalized understanding of plan. -     Ambulatory referral to Physical Therapy  . Reviewed expectations re: course of current medical issues. . Discussed self-management of symptoms. . Outlined signs and symptoms indicating need for more acute intervention. . Patient verbalized understanding and all questions were answered. Marland Kitchen Health Maintenance issues including appropriate healthy diet, exercise, and smoking avoidance were discussed with patient. . See orders for this visit as documented in the electronic medical record.  I discussed the assessment and treatment plan with the patient. The patient was provided an opportunity to ask questions and all were answered. The patient agreed with the plan and demonstrated an understanding of the instructions.   The patient was advised to call back or seek an in-person evaluation if the symptoms worsen or if the condition fails to improve as anticipated.   CMA or LPN served as scribe during this visit. History, Physical, and Plan performed by medical provider. The above documentation has been reviewed and is accurate and complete.  [place], [place] 04/05/2019

## 2019-04-05 NOTE — Telephone Encounter (Signed)
Please Triage.   Copied from [place] [phone]. Topic: Appointment Scheduling - Scheduling Inquiry for Clinic >> Apr 05, 2019 10:31 AM Lennox Solders wrote: Reason for CRM:pt has been having vertigo for over a week and today it is worst. Pt would like to see some one today. Called office 3x. Pt is ok with virtual visit today

## 2019-04-05 NOTE — Telephone Encounter (Signed)
Called and spoke with patient. Sx started about 1 week ago after going on boat ride. She feels dizzy with occasional nausea. She denies CP or SOB.   Please contact pt to schedule acute visit today with other provider at [facility]. Can be in office or virtual.

## 2019-04-05 NOTE — Telephone Encounter (Signed)
Patient is scheduled   

## 2019-04-06 ENCOUNTER — Other Ambulatory Visit: Payer: Self-pay

## 2019-04-06 ENCOUNTER — Inpatient Hospital Stay: Payer: Medicare HMO | Attending: Oncology | Admitting: Oncology

## 2019-04-06 ENCOUNTER — Inpatient Hospital Stay: Payer: Medicare HMO

## 2019-04-06 VITALS — BP 149/57 | HR 69 | Temp 98.0°F | Resp 18 | Ht 65.5 in | Wt 198.9 lb

## 2019-04-06 DIAGNOSIS — Z79811 Long term (current) use of aromatase inhibitors: Secondary | ICD-10-CM | POA: Diagnosis not present

## 2019-04-06 DIAGNOSIS — I10 Essential (primary) hypertension: Secondary | ICD-10-CM | POA: Diagnosis not present

## 2019-04-06 DIAGNOSIS — E039 Hypothyroidism, unspecified: Secondary | ICD-10-CM | POA: Diagnosis not present

## 2019-04-06 DIAGNOSIS — Z170 Estrogen receptor positive status [ER+]: Secondary | ICD-10-CM | POA: Diagnosis not present

## 2019-04-06 DIAGNOSIS — C50411 Malignant neoplasm of upper-outer quadrant of right female breast: Secondary | ICD-10-CM

## 2019-04-06 LAB — CBC WITH DIFFERENTIAL/PLATELET
Abs Immature Granulocytes: 0.03 10*3/uL (ref 0.00–0.07)
Basophils Absolute: 0 10*3/uL (ref 0.0–0.1)
Basophils Relative: 0 %
Eosinophils Absolute: 0.1 10*3/uL (ref 0.0–0.5)
Eosinophils Relative: 1 %
HCT: 42.2 % (ref 36.0–46.0)
Hemoglobin: 14.4 g/dL (ref 12.0–15.0)
Immature Granulocytes: 1 %
Lymphocytes Relative: 38 %
Lymphs Abs: 1.9 10*3/uL (ref 0.7–4.0)
MCH: 30.4 pg (ref 26.0–34.0)
MCHC: 34.1 g/dL (ref 30.0–36.0)
MCV: 89 fL (ref 80.0–100.0)
Monocytes Absolute: 0.4 10*3/uL (ref 0.1–1.0)
Monocytes Relative: 7 %
Neutro Abs: 2.6 10*3/uL (ref 1.7–7.7)
Neutrophils Relative %: 53 %
Platelets: 239 10*3/uL (ref 150–400)
RBC: 4.74 MIL/uL (ref 3.87–5.11)
RDW: 13.6 % (ref 11.5–15.5)
WBC: 5 10*3/uL (ref 4.0–10.5)
nRBC: 0 % (ref 0.0–0.2)

## 2019-04-06 LAB — COMPREHENSIVE METABOLIC PANEL
ALT: 48 U/L — ABNORMAL HIGH (ref 0–44)
AST: 32 U/L (ref 15–41)
Albumin: 4.1 g/dL (ref 3.5–5.0)
Alkaline Phosphatase: 75 U/L (ref 38–126)
Anion gap: 8 (ref 5–15)
BUN: 10 mg/dL (ref 8–23)
CO2: 29 mmol/L (ref 22–32)
Calcium: 9.4 mg/dL (ref 8.9–10.3)
Chloride: 104 mmol/L (ref 98–111)
Creatinine, Ser: 0.78 mg/dL (ref 0.44–1.00)
GFR calc Af Amer: >60 mL/min (ref >60)
GFR calc non Af Amer: >60 mL/min (ref >60)
Glucose, Bld: 93 mg/dL (ref 70–99)
Potassium: 3.7 mmol/L (ref 3.5–5.1)
Sodium: 141 mmol/L (ref 135–145)
Total Bilirubin: 0.5 mg/dL (ref 0.3–1.2)
Total Protein: 6.9 g/dL (ref 6.5–8.1)

## 2019-04-06 MED ORDER — ANASTROZOLE 1 MG PO TABS: 1.0000 mg | ORAL_TABLET | Freq: Every day | ORAL | 4 refills | Status: DC

## 2019-04-06 MED ORDER — GABAPENTIN 300 MG PO CAPS: 300.0000 mg | ORAL_CAPSULE | Freq: Every day | ORAL | 4 refills | Status: DC

## 2019-04-06 MED ORDER — GABAPENTIN 100 MG PO CAPS: 100.0000 mg | ORAL_CAPSULE | Freq: Two times a day (BID) | ORAL | 3 refills | Status: DC

## 2019-04-06 NOTE — Progress Notes (Signed)
[place]  [phone] [phone]    ID: Christine Leon DOB: 03-04-1951  MR#: [id number]  WEX#:[id number]  Patient Care Team: Marin Olp, MD as PCP - General (Family Medicine) Rolm Bookbinder, MD as Consulting Physician (General Surgery) Neils Siracusa, Virgie Dad, MD as Consulting Physician (Oncology) Eppie Gibson, MD as Attending Physician (Radiation Oncology) Armbruster, Carlota Raspberry, MD as Consulting Physician (Gastroenterology) OTHER MD:   CHIEF COMPLAINT: Estrogen receptor positive breast cancer  CURRENT TREATMENT: anastrozole   INTERVAL HISTORY: Christine Leon returns today for follow-up and treatment of her estrogen receptor positive breast cancer.   She continues on anastrozole.  She is tolerating it much better since she started the gabapentin.  She took it initially at bedtime, and has been able to sleep right through the night on that basis.  She then discussed it with Dr. Yong Channel who added gabapentin during the day 800 mg and that has taken care of the daytime hot flashes.  It does not make her drowsy or sleepy.  She is scheduled to undergo repeat bilateral mammogram and bone density screening tomorrow, 04/07/2019.   REVIEW OF SYSTEMS: Renee walks 20 minutes a day with her husband, who however now is having knee problems in addition to his 2 strokes.  She is not otherwise exercising.  She enjoys her grandchildren all 106 of whom live in [place] and she takes her granddaughter to panel insulin every Thursday.  She also takes care of her 32 year old mother who is "sharp as attack" and currently a nephew's wife is staying with HER-2.  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  Ensure there is a very full house.  Despite all this she is taking appropriate pandemic precautions.  Detailed review of systems today was otherwise noncontributory   HISTORY OF CURRENT ILLNESS: From the original intake note:  Christine Leon "Christine Leon"  had previous mammography in 2016 showing distortion of the right breast with benign findings along with a 41-monthfollow up. She did not have further mammography until she had bilateral diagnostic mammography with tomography and right breast ultrasonography at [facility] 03/29/2018 showing: breast density category B. There was a suspicious mass in the right breast upper outer quadrant at 12 o'clock measuring 0.5 x 0.4 x 0.4 cm. There were multiple adjacent cysts. There was no sonographic evidence of abnormal lymph nodes or adenopathy.   Accordingly on 03/30/2018 she proceeded to biopsy of the right breast area in question. The pathology from this procedure showed (([id number]: Invasive ductal carcinoma, grade I. Prognostic indicators significant for: estrogen receptor, 100% positive and progesterone receptor, 80% positive, both with string staining intensity. Proliferation marker Ki67 at 3%. HER2 negative with an immunohistochemistry of (1+).  The patient's subsequent history is as detailed below.   PAST MEDICAL HISTORY: Past Medical History:  Diagnosis Date  . Allergy   . Celiac sprue   . History of radiation therapy 05/26/18- 06/29/18   Right Breast 15 fractions for a total dose of 40.05 Gy. Right Breast boost 5 fractions for a total dose of 10 Gy  . Hypertension   . Hypothyroidism   . PONV (postoperative nausea and vomiting)     PAST SURGICAL HISTORY: Past Surgical History:  Procedure Laterality Date  . ABDOMINAL HYSTERECTOMY     in 536s fibroids. including cervix removal. per patient was told no further pap smears after.   .Marland Kitchenarthroscopic knee surgery     2015 under Dr. CTheda Sers .  BREAST LUMPECTOMY WITH RADIOACTIVE SEED AND SENTINEL LYMPH NODE BIOPSY Right 04/27/2018   Procedure: RIGHT BREAST LUMPECTOMY WITH RADIOACTIVE SEED AND RIGHT AXILLARYSENTINEL LYMPH NODE BIOPSY ERAS PATHWAY;  Surgeon: Rolm Bookbinder, MD;  Location: [facility];  Service: General;   Laterality: Right;  PECTORAL BLOCK  . CHOLECYSTECTOMY    . COCCYX REMOVAL     after cheerleading accident  . OOPHORECTOMY     in 36s  . TONSILLECTOMY    Hysterectomy with BSO (fibroids)   FAMILY HISTORY Family History  Problem Relation Age of Onset  . Hypertension Mother   . Coronary artery disease Father        23  . COPD Father        passed age 62  . Breast cancer Maternal Aunt    The patient's father died at age 23 due to COPD and heart valve issues. The patient's mother is alive at age 40. The patient had 1 brother (deceased) and no sisters. There was a maternal aunt who was diagnosed with breast cancer in the 52's. The patient' denies any other family members with breast, ovarian, or other cancers.  The patient does not meet criteria for genetics testing   GYNECOLOGIC HISTORY:  No LMP recorded (lmp unknown). Patient has had a hysterectomy. Menarche: [age] years old Age at first live birth: 68 years old  She is GX P2. She is status post total hysterectomy with BSO in 1995. Her LMP was in her late 62's. She took HRT for a few years and recently discontinued.     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 Her husband, [name], is retired from working in Scientist, physiological for [employer]. [name] recently suffered from a medical incident and Christine Leon has been caring for him. The patient's oldest, a son named Christine Leon, lives in [place] and works as a [employer]th Land. The patient's youngest, a daughter named Christine Leon/gallery conservator, lives in [place] and works as a Print production planner. The patient has 4 grandchildren. The patient is not a church attender   ADVANCED DIRECTIVES: Her husband recently suffered a medical incident, so she plans on naming her son, Malynda Smolinski, as Medical Power of Attorney.     HEALTH MAINTENANCE: Social History   Tobacco Use  . Smoking status: Former Smoker    Packs/day: 1.00    Years: 39.00    Pack years: 39.00    Types:  Cigarettes    Quit date: 07/13/2006    Years since quitting: 12.7  . Smokeless tobacco: Never Used  Substance Use Topics  . Alcohol use: Yes    Alcohol/week: 1.0 standard drinks    Types: 1 Standard drinks or equivalent per week  . Drug use: No    Colonoscopy: 2018/ Dr. Havery Moros (repeat in 2 years)  PAP:  Bone density: scheduled for 2019   Allergies  Allergen Reactions  . Gluten Meal     Has celic  . Wheat Bran     Has celic   . Cephalexin Hives    Current Outpatient Medications  Medication Sig Dispense Refill  . ALPHA LIPOIC ACID PO Take 1 tablet by mouth daily.    Marland Kitchen anastrozole (ARIMIDEX) 1 MG tablet TAKE 1 TABLET BY MOUTH EVERY DAY 90 tablet 0  . Cholecalciferol (VITAMIN D-3 PO) Take 1,500 Units by mouth daily.    Marland Kitchen escitalopram (LEXAPRO) 5 MG tablet Take 1 tablet (5 mg total) by mouth daily. 30 tablet 5  . fish  oil-omega-3 fatty acids 1000 MG capsule Take 1 g by mouth daily.      . fluticasone (FLONASE) 50 MCG/ACT nasal spray Place 2 sprays into both nostrils daily. 16 g 6  . gabapentin (NEURONTIN) 100 MG capsule Take 1 capsule (100 mg total) by mouth 2 (two) times daily. Then take 300 mg before bed (rx from Dr. Jana Hakim) 180 capsule 3  . gabapentin (NEURONTIN) 300 MG capsule Take 1 capsule (300 mg total) by mouth at bedtime. 90 capsule 4  . glucosamine-chondroitin 500-400 MG tablet Take 1 tablet by mouth daily.      Marland Kitchen levothyroxine (SYNTHROID) 125 MCG tablet Take 1 tablet (125 mcg total) by mouth daily before breakfast. 90 tablet 3  . lisinopril-hydrochlorothiazide (ZESTORETIC) 20-25 MG tablet Take 1 tablet by mouth daily. 90 tablet 3  . vitamin B-12 (CYANOCOBALAMIN) 500 MCG tablet Take 500 mcg by mouth daily.     No current facility-administered medications for this visit.     OBJECTIVE: Middle-aged white woman who appears stated age  16:   04/06/19 1316  BP: (!) 149/57  Pulse: 69  Resp: 18  Temp: 98 F (36.7 C)  SpO2: 99%     Body mass index is 32.6  kg/m.   Wt Readings from Last 3 Encounters:  04/06/19 198 lb 14.4 oz (90.2 kg)  11/18/18 193 lb (87.5 kg)  08/03/18 194 lb 6.4 oz (88.2 kg)      ECOG FS:1 - Symptomatic but completely ambulatory  Sclerae unicteric, EOMs intact Wearing a mask No cervical or supraclavicular adenopathy Lungs no rales or rhonchi Heart regular rate and rhythm Abd soft, nontender, positive bowel sounds MSK no focal spinal tenderness, no upper extremity lymphedema Neuro: nonfocal, well oriented, appropriate affect Breasts: The right breast is status post lumpectomy and radiation.  There is no evidence of local recurrence.  The left breast is benign.  Both axillae are benign.   LAB RESULTS:  CMP     Component Value Date/Time   NA 142 11/24/2018 1006   K 3.8 11/24/2018 1006   CL 103 11/24/2018 1006   CO2 30 11/24/2018 1006   GLUCOSE 92 11/24/2018 1006   BUN 15 11/24/2018 1006   CREATININE 0.86 11/24/2018 1006   CALCIUM 9.1 11/24/2018 1006   PROT 6.3 11/24/2018 1006   ALBUMIN 4.1 11/24/2018 1006   AST 26 11/24/2018 1006   ALT 33 11/24/2018 1006   ALKPHOS 58 11/24/2018 1006   BILITOT 0.6 11/24/2018 1006   GFRNONAA >60 04/22/2018 1300   GFRAA >60 04/22/2018 1300    No results found for: TOTALPROTELP, ALBUMINELP, A1GS, A2GS, BETS, BETA2SER, GAMS, MSPIKE, SPEI  No results found for: KPAFRELGTCHN, LAMBDASER, KAPLAMBRATIO  Lab Results  Component Value Date   WBC 5.0 04/06/2019   NEUTROABS 2.6 04/06/2019   HGB 14.4 04/06/2019   HCT 42.2 04/06/2019   MCV 89.0 04/06/2019   PLT 239 04/06/2019    _0 @  No results found for: LABCA2  No components found for: YPPJKD326  No results for input(s): INR in the last 168 hours.  No results found for: LABCA2  No results found for: ZTI458  No results found for: KDX833  No results found for: ASN053  No results found for: CA2729  No components found for: HGQUANT  No results found for: CEA1 / No results found for: CEA1   No  results found for: AFPTUMOR  No results found for: CHROMOGRNA  No results found for: PSA1  Appointment on 04/06/2019  Component Date Value  Ref Range Status  . WBC 04/06/2019 5.0  4.0 - 10.5 K/uL Final  . RBC 04/06/2019 4.74  3.87 - 5.11 MIL/uL Final  . Hemoglobin 04/06/2019 14.4  12.0 - 15.0 g/dL Final  . HCT 04/06/2019 42.2  36.0 - 46.0 % Final  . MCV 04/06/2019 89.0  80.0 - 100.0 fL Final  . MCH 04/06/2019 30.4  26.0 - 34.0 pg Final  . MCHC 04/06/2019 34.1  30.0 - 36.0 g/dL Final  . RDW 04/06/2019 13.6  11.5 - 15.5 % Final  . Platelets 04/06/2019 239  150 - 400 K/uL Final  . nRBC 04/06/2019 0.0  0.0 - 0.2 % Final  . Neutrophils Relative % 04/06/2019 53  % Final  . Neutro Abs 04/06/2019 2.6  1.7 - 7.7 K/uL Final  . Lymphocytes Relative 04/06/2019 38  % Final  . Lymphs Abs 04/06/2019 1.9  0.7 - 4.0 K/uL Final  . Monocytes Relative 04/06/2019 7  % Final  . Monocytes Absolute 04/06/2019 0.4  0.1 - 1.0 K/uL Final  . Eosinophils Relative 04/06/2019 1  % Final  . Eosinophils Absolute 04/06/2019 0.1  0.0 - 0.5 K/uL Final  . Basophils Relative 04/06/2019 0  % Final  . Basophils Absolute 04/06/2019 0.0  0.0 - 0.1 K/uL Final  . Immature Granulocytes 04/06/2019 1  % Final  . Abs Immature Granulocytes 04/06/2019 0.03  0.00 - 0.07 K/uL Final   Performed at [facility], [address] [place], [place] [address]    (this displays the last labs from the last 3 days)  No results found for: TOTALPROTELP, ALBUMINELP, A1GS, A2GS, BETS, BETA2SER, GAMS, MSPIKE, SPEI (this displays SPEP labs)  No results found for: KPAFRELGTCHN, LAMBDASER, KAPLAMBRATIO (kappa/lambda light chains)  No results found for: HGBA, HGBA2QUANT, HGBFQUANT, HGBSQUAN (Hemoglobinopathy evaluation)   No results found for: LDH  No results found for: IRON, TIBC, IRONPCTSAT (Iron and TIBC)  No results found for: FERRITIN  Urinalysis    Component Value Date/Time   COLORURINE YELLOW 12/27/2016  0626   APPEARANCEUR CLEAR 12/27/2016 0626   LABSPEC 1.006 12/27/2016 0626   PHURINE 7.0 12/27/2016 0626   GLUCOSEU NEGATIVE 12/27/2016 0626   HGBUR NEGATIVE 12/27/2016 0626   BILIRUBINUR Negative 04/15/2017 1047   KETONESUR NEGATIVE 12/27/2016 0626   PROTEINUR Negative 04/15/2017 1047   PROTEINUR NEGATIVE 12/27/2016 0626   UROBILINOGEN 0.2 04/15/2017 1047   NITRITE Negative 04/15/2017 1047   NITRITE NEGATIVE 12/27/2016 0626   LEUKOCYTESUR Moderate (2+) (A) 04/15/2017 1047     STUDIES: No results found.   ELIGIBLE FOR AVAILABLE RESEARCH PROTOCOL: no   ASSESSMENT: 68 y.o. [place] Ser, [place] woman status post right breast upper outer quadrant biopsy 03/30/2018 for a clinical T1a N0, stage IA invasive ductal carcinoma, grade 1, estrogen and progesterone receptor positive, HER-2 not amplified, with an MIB-1 of 3%.  (1) status post right lumpectomy and sentinel lymph node sampling 04/27/2018 for a pT1c pN0, stage IA invasive ductal carcinoma, grade 1, with negative margins  (a) 1 sentinel lymph node was removed  (2) Oncotype DX score of 17 predicts a risk of recurrence outside the breast over 9 years of 5% if the patient's only systemic therapy is an antiestrogen for 5 years.  It also predicts no benefit from chemotherapy  (3) adjuvant radiation completed 06/28/2018  (4) started tamoxifen 07/27/2017--discontinued secondary to rash  (5) anastrozole started 08/31/2018   PLAN: Christine Leon is now just about a year out from definitive surgery for her breast cancer with no  evidence of disease recurrence.  This is favorable.  She is tolerating anastrozole much better and the plan will be to continue that for a total of 5 years.  The gabapentin is helping her hot flashes significantly and not causing her any significant side effects.  We renewed all that today.  She is keeping appropriate pandemic precautions.  She will have her mammogram and bone density tomorrow.  I plan to see her in  October of next year to make sure that the visit is after the studies and not before  She knows to call for any other issues that may develop before the next visit.  Nevea Spiewak, Virgie Dad, MD  04/06/19 1:38 PM Medical Oncology and Hematology [facility] [address] [place], [place] [address] Tel. [phone]    Fax. [phone]   I, Wilburn Mylar, am acting as scribe for Dr. Virgie Dad. Othelia Riederer.  I, Lurline Del MD, have reviewed the above documentation for accuracy and completeness, and I agree with the above.

## 2019-04-07 ENCOUNTER — Other Ambulatory Visit: Payer: Self-pay | Admitting: Oncology

## 2019-04-07 ENCOUNTER — Ambulatory Visit
Admission: RE | Admit: 2019-04-07 | Discharge: 2019-04-07 | Disposition: A | Discharge status: Home / Self Care | Payer: Medicare HMO | Source: Ambulatory Visit | Attending: Oncology | Admitting: Oncology

## 2019-04-07 ENCOUNTER — Telehealth: Payer: Self-pay | Admitting: Oncology

## 2019-04-07 DIAGNOSIS — Z170 Estrogen receptor positive status [ER+]: Secondary | ICD-10-CM

## 2019-04-07 DIAGNOSIS — N6011 Diffuse cystic mastopathy of right breast: Secondary | ICD-10-CM | POA: Diagnosis not present

## 2019-04-07 DIAGNOSIS — R928 Other abnormal and inconclusive findings on diagnostic imaging of breast: Secondary | ICD-10-CM | POA: Diagnosis not present

## 2019-04-07 DIAGNOSIS — C50411 Malignant neoplasm of upper-outer quadrant of right female breast: Secondary | ICD-10-CM

## 2019-04-07 DIAGNOSIS — Z13820 Encounter for screening for osteoporosis: Secondary | ICD-10-CM | POA: Diagnosis not present

## 2019-04-07 DIAGNOSIS — Z853 Personal history of malignant neoplasm of breast: Secondary | ICD-10-CM | POA: Diagnosis not present

## 2019-04-07 DIAGNOSIS — Z780 Asymptomatic menopausal state: Secondary | ICD-10-CM | POA: Diagnosis not present

## 2019-04-07 DIAGNOSIS — R2231 Localized swelling, mass and lump, right upper limb: Secondary | ICD-10-CM

## 2019-04-07 NOTE — Telephone Encounter (Signed)
I talk with patient and she looked at [employer]

## 2019-04-13 ENCOUNTER — Ambulatory Visit: Payer: Medicare HMO | Attending: Physician Assistant | Admitting: Physical Therapy

## 2019-04-13 ENCOUNTER — Ambulatory Visit: Payer: Medicare HMO | Admitting: Physical Therapy

## 2019-04-13 ENCOUNTER — Encounter: Payer: Self-pay | Admitting: Physical Therapy

## 2019-04-13 ENCOUNTER — Other Ambulatory Visit: Payer: Self-pay

## 2019-04-13 DIAGNOSIS — R262 Difficulty in walking, not elsewhere classified: Secondary | ICD-10-CM | POA: Insufficient documentation

## 2019-04-13 DIAGNOSIS — R42 Dizziness and giddiness: Secondary | ICD-10-CM | POA: Insufficient documentation

## 2019-04-13 DIAGNOSIS — R2681 Unsteadiness on feet: Secondary | ICD-10-CM | POA: Diagnosis not present

## 2019-04-13 NOTE — Therapy (Signed)
[facility] [address] [place], [place], [address] Phone: [phone]   Fax:  [phone]  Physical Therapy Treatment  Patient Details  Name: Christine Leon MRN: [id number] Date of Birth: 1950/12/30 Referring Provider (PT): Inda Coke, [place]   Encounter Date: 04/13/2019  PT End of Session - 04/13/19 1218    Visit Number  1    Number of Visits  5    Date for PT Re-Evaluation  05/13/19    Authorization Type  [employer] Medicare $35 copay; VL: [place]    PT Start Time  0805    PT Stop Time  0850    PT Time Calculation (min)  45 min       Past Medical History:  Diagnosis Date  . Allergy   . Celiac sprue   . History of radiation therapy 05/26/18- 06/29/18   Right Breast 15 fractions for a total dose of 40.05 Gy. Right Breast boost 5 fractions for a total dose of 10 Gy  . Hypertension   . Hypothyroidism   . Personal history of radiation therapy 2019  . PONV (postoperative nausea and vomiting)     Past Surgical History:  Procedure Laterality Date  . ABDOMINAL HYSTERECTOMY     in 7s. fibroids. including cervix removal. per patient was told no further pap smears after.   Marland Kitchen arthroscopic knee surgery     2015 under Dr. Theda Sers  . BREAST BIOPSY Right 04/01/2018  . BREAST LUMPECTOMY Right   . BREAST LUMPECTOMY WITH RADIOACTIVE SEED AND SENTINEL LYMPH NODE BIOPSY Right 04/27/2018   Procedure: RIGHT BREAST LUMPECTOMY WITH RADIOACTIVE SEED AND RIGHT AXILLARYSENTINEL LYMPH NODE BIOPSY ERAS PATHWAY;  Surgeon: Rolm Bookbinder, MD;  Location: [facility];  Service: General;  Laterality: Right;  PECTORAL BLOCK  . CHOLECYSTECTOMY    . COCCYX REMOVAL     after cheerleading accident  . OOPHORECTOMY     in 67s  . TONSILLECTOMY      There were no vitals filed for this visit.  Subjective Assessment - 04/13/19 0807    Subjective  Pt reports dizziness started after long ride on a ski boat - current symptoms  have gotten worse.  Pt has experienced this dizziness before after a trip to [place] and she went on a boat ride.  Took a couple weeks to resolve.  Has been on cruises and that does not bother her.  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    Pertinent History  Celiac disease, HTN, aortic atherosclerosis, hypothyroidism, malignant neoplasm of upper-outer quadrant of R breast with lumpectomy with radioactive seed    Patient Stated Goals  To get rid of the dizziness    Currently in Pain?  No/denies         [facility] PT Assessment - 04/13/19 0810      Assessment   Medical Diagnosis  Dizziness    Referring Provider (PT)  Inda Coke, PA    Onset Date/Surgical Date  04/05/19      Precautions   Precautions  Other (comment)    Precaution Comments  Celiac disease, HTN, aortic atherosclerosis, hypothyroidism, malignant neoplasm of upper-outer quadrant of R breast with lumpectomy with radioactive seed      Balance Screen   Has the patient fallen in the past 6 months  No      Prior Function   Level of Independence  Independent    Vocation  Full time employment  Vocation Requirements  drafts by hand for work, lots of looking down    Leisure  continues to drive - does not bother her in a car, walks for exercise      Observation/Other Assessments   Focus on Therapeutic Outcomes (FOTO)   N/A         Vestibular Assessment - 04/13/19 0812      Symptom Behavior   Subjective history of current problem  had one other episode of dizziness after trip to [place] in [place].  Denies headache, changes in vision or hearing, had a R earache but that has resolved, has had nausea but no emesis.    Type of Dizziness   Comment   rocking back and forth   Frequency of Dizziness  daily    Duration of Dizziness  30 seconds - minute    Symptom Nature  Motion provoked    Aggravating Factors  Turning body quickly;Turning head quickly;Sit to stand;Lying supine;Forward bending     Relieving Factors  Comments   sitting down   Progression of Symptoms  Worse      Oculomotor Exam   Oculomotor Alignment  Normal    Ocular ROM  WFL    Spontaneous  Absent    Gaze-induced   Left beating nystagmus with L gaze;Right beating nystagmus with R gaze    Smooth Pursuits  Intact    Saccades  Intact    Comment  dizziness reported with oculomotor exam      Oculomotor Exam-Fixation Suppressed    Left Head Impulse  negative    Right Head Impulse  negative      Vestibulo-Ocular Reflex   VOR to Slow Head Movement  Normal    VOR Cancellation  Normal   but reports feeling dizzy     Positional Testing   Dix-Hallpike  Dix-Hallpike Right;Dix-Hallpike Left    Horizontal Canal Testing  Horizontal Canal Right;Horizontal Canal Left      Dix-Hallpike Right   Dix-Hallpike Right Duration  >1 minute    Dix-Hallpike Right Symptoms  No nystagmus      Dix-Hallpike Left   Dix-Hallpike Left Duration  >1 minute    Dix-Hallpike Left Symptoms  No nystagmus      Horizontal Canal Right   Horizontal Canal Right Duration  0    Horizontal Canal Right Symptoms  Normal      Horizontal Canal Left   Horizontal Canal Left Duration  0    Horizontal Canal Left Symptoms  Normal      Balancemaster   Balancemaster Comment  MCTSIB: 30 seconds first condition, 30 seconds second condition with mild symptoms, condition 3 - 30 seconds with imbalance but no dizziness.  Condition 4: 11 seconds with imbalance, no dizziness      Positional Sensitivities   Sit to Supine  No dizziness    Supine to Left Side  Moderate dizziness   return to sitting   Supine to Right Side  Moderate dizziness   return to sitting   Supine to Sitting  Mild dizziness    Right Hallpike  Moderate dizziness    Up from Right Hallpike  Moderate dizziness    Up from Left Hallpike  Mild dizziness    Nose to Right Knee  Lightheadedness    Right Knee to Sitting  Lightedness    Nose to Left Knee  No dizziness    Left Knee to Sitting  No  dizziness    Head Turning x 5  Moderate dizziness  Head Nodding x 5  Moderate dizziness    Pivot Right in Standing  No dizziness    Pivot Left in Standing  No dizziness    Rolling Right  No dizziness    Rolling Left  No dizziness                       PT Education - 04/13/19 1218    Education Details  clinical findings, PT POC and goals    Person(s) Educated  Patient    Methods  Explanation    Comprehension  Verbalized understanding          PT Long Term Goals - 04/13/19 1224      PT LONG TERM GOAL #1   Title  Pt will be independent with final vestibular and balance HEP    Time  4    Period  Weeks    Status  New    Target Date  05/13/19      PT LONG TERM GOAL #2   Title  Pt will report no symptoms of rocking or dizziness with head turns, looking down, supine > sit and sit > stand    Baseline  mild-moderate dizziness    Time  4    Period  Weeks    Status  New    Target Date  05/13/19      PT LONG TERM GOAL #3   Title  Pt will demosntrate improved ability to utilize vestibular system for balance when sensory and visual information is compromised as indicated by ability to hold condition 4 on MCTSIB x 30 seconds    Baseline  11 seconds    Time  4    Period  Weeks    Status  New    Target Date  05/13/19            Plan - 04/13/19 1219    Clinical Impression Statement  Pt is a 68 year old female referred to Neuro OPPT for evaluation of dizziness following a boat ride.  Pt's PMH is significant for the following: Celiac disease, HTN, aortic atherosclerosis, hypothyroidism, malignant neoplasm of upper-outer quadrant of R breast with lumpectomy with radioactive seed. The following deficits were noted during pt's exam: visual motion sensitivity, disequilibrium, and impaired balance placing pt at risk for falls. Pt was negative for vertigo or nystagmus during hallpike-dix testing on R and L side.  Pt would benefit from skilled PT to address these  impairments and functional limitations to maximize functional mobility independence and reduce falls risk.    Personal Factors and Comorbidities  Comorbidity 3+;Profession    Comorbidities  Celiac disease, HTN, aortic atherosclerosis, hypothyroidism, malignant neoplasm of upper-outer quadrant of R breast with lumpectomy with radioactive seed    Examination-Activity Limitations  Bend;Locomotion Level;Bed Mobility;Stand;Other   work - looking down   [employer]  Other;Driving   work   Merchant navy officer  Stable/Uncomplicated    Designer, jewellery  Low    Rehab Potential  Good    PT Frequency  1x / week    PT Duration  4 weeks    PT Treatment/Interventions  ADLs/Self Care Home Management;Canalith Repostioning;Gait training;Stair training;Functional mobility training;Therapeutic activities;Therapeutic exercise;Balance training;Neuromuscular re-education;Patient/family education;Vestibular    PT Next Visit Plan  initiate HEP focusing on habituation, x1 viewing, balance with eyes closed, compliant surface    Consulted and Agree with Plan of Care  Patient       Patient will benefit from skilled  therapeutic intervention in order to improve the following deficits and impairments:  Decreased balance, Difficulty walking, Dizziness  Visit Diagnosis: Dizziness and giddiness  Unsteadiness on feet  Difficulty in walking, not elsewhere classified     Problem List Patient Active Problem List   Diagnosis Date Noted  . Malignant neoplasm of upper-outer quadrant of right breast in female, estrogen receptor positive ([facility]) 04/22/2018  . History of adenomatous polyp of colon 06/07/2017  . Aortic atherosclerosis ([facility]) 04/15/2017  . Hot flashes 08/15/2014  . Retinal tear 08/15/2014  . Obesity 08/15/2014  . Left knee pain 07/25/2014  . Hypothyroidism 07/19/2014  . CELIAC SPRUE 04/20/2008  . Allergic rhinitis 07/20/2007  . Essential hypertension  06/05/2007    Rico Junker, PT, DPT 04/13/19    12:29 PM    [facility] [address] [place], [place], [address] Phone: [phone]   Fax:  [phone]  Name: Kathleena Freeman [facility] MRN: [id number] Date of Birth: [birth year]

## 2019-04-19 ENCOUNTER — Encounter: Payer: Self-pay | Admitting: Family Medicine

## 2019-04-26 NOTE — Patient Instructions (Addendum)
Health Maintenance Due  Topic Date Due  . INFLUENZA VACCINE - today  02/11/2019   Please check with your pharmacy to see if they have the shingrix vaccine. If they do- please get this immunization and update [place] by phone call or mychart with dates you receive the vaccine  Continue efforts for weight loss- consider at least trying a healthy snack at lunchtime in case that is slowing metabolism. We will also make sure thyroid levels are ok today.   Please stop by lab before you go If you do not have mychart- we will call you about results within 5 business days of [place] receiving them.  If you have mychart- we will send your results within 3 business days of [place] receiving them.  If abnormal or we want to clarify a result, we will call or mychart you to make sure you receive the message.  If you have questions or concerns or don't hear within 5-7 days, please send [place] a message or call us.

## 2019-04-26 NOTE — Progress Notes (Addendum)
Phone: [phone]   Subjective:  Patient presents today for their annual physical. Chief complaint-noted.   See problem oriented charting- ROS- full  review of systems was completed and negative except for: dizziness and high stress  The following were reviewed and entered/updated in epic: Past Medical History:  Diagnosis Date  . Allergy   . Celiac sprue   . History of radiation therapy 05/26/18- 06/29/18   Right Breast 15 fractions for a total dose of 40.05 Gy. Right Breast boost 5 fractions for a total dose of 10 Gy  . Hypertension   . Hypothyroidism   . Personal history of radiation therapy 2019  . PONV (postoperative nausea and vomiting)    Patient Active Problem List   Diagnosis Date Noted  . Hot flashes 08/15/2014    Priority: Medium  . Obesity 08/15/2014    Priority: Medium  . Hypothyroidism 07/19/2014    Priority: Medium  . CELIAC SPRUE 04/20/2008    Priority: Medium  . Essential hypertension 06/05/2007    Priority: Medium  . Retinal tear 08/15/2014    Priority: Low  . Left knee pain 07/25/2014    Priority: Low  . Allergic rhinitis 07/20/2007    Priority: Low  . Malignant neoplasm of upper-outer quadrant of right breast in female, estrogen receptor positive ([place]) 04/22/2018  . History of adenomatous polyp of colon 06/07/2017  . Aortic atherosclerosis ([place]) 04/15/2017   Past Surgical History:  Procedure Laterality Date  . ABDOMINAL HYSTERECTOMY     in 69s. fibroids. including cervix removal. per patient was told no further pap smears after.   Marland Kitchen arthroscopic knee surgery     2015 under Dr. Theda Sers  . BREAST BIOPSY Right 04/01/2018  . BREAST LUMPECTOMY Right   . BREAST LUMPECTOMY WITH RADIOACTIVE SEED AND SENTINEL LYMPH NODE BIOPSY Right 04/27/2018   Procedure: RIGHT BREAST LUMPECTOMY WITH RADIOACTIVE SEED AND RIGHT AXILLARYSENTINEL LYMPH NODE BIOPSY ERAS PATHWAY;  Surgeon: Rolm Bookbinder, MD;  Location: [facility];  Service: General;   Laterality: Right;  PECTORAL BLOCK  . CHOLECYSTECTOMY    . COCCYX REMOVAL     after cheerleading accident  . OOPHORECTOMY     in 51s  . TONSILLECTOMY      Family History  Problem Relation Age of Onset  . Hypertension Mother   . Coronary artery disease Father        55  . COPD Father        passed age 51  . Breast cancer Maternal Aunt     Medications- reviewed and updated Current Outpatient Medications  Medication Sig Dispense Refill  . ALPHA LIPOIC ACID PO Take 1 tablet by mouth daily.    Marland Kitchen anastrozole (ARIMIDEX) 1 MG tablet Take 1 tablet (1 mg total) by mouth daily. 90 tablet 4  . Cholecalciferol (VITAMIN D-3 PO) Take 1,500 Units by mouth daily.    . fish oil-omega-3 fatty acids 1000 MG capsule Take 1 g by mouth daily.      . fluticasone (FLONASE) 50 MCG/ACT nasal spray Place 2 sprays into both nostrils daily. 16 g 6  . gabapentin (NEURONTIN) 100 MG capsule Take 1 capsule (100 mg total) by mouth 2 (two) times daily. Then take 300 mg before bed (rx from Dr. Jana Hakim) 180 capsule 3  . gabapentin (NEURONTIN) 300 MG capsule Take 1 capsule (300 mg total) by mouth at bedtime. 90 capsule 4  . glucosamine-chondroitin 500-400 MG tablet Take 1 tablet by mouth daily.      Marland Kitchen  levothyroxine (SYNTHROID) 125 MCG tablet Take 1 tablet (125 mcg total) by mouth daily before breakfast. 90 tablet 3  . lisinopril-hydrochlorothiazide (ZESTORETIC) 20-25 MG tablet Take 1 tablet by mouth daily. 90 tablet 3  . vitamin B-12 (CYANOCOBALAMIN) 500 MCG tablet Take 500 mcg by mouth daily.     No current facility-administered medications for this visit.     Allergies-reviewed and updated Allergies  Allergen Reactions  . Gluten Meal     Has celic  . Wheat Bran     Has celic   . Cephalexin Hives    Social History   Social History Narrative   Married (husband patient of Dr. Yong Channel), 2 children, 1 grandchild   Her mom lives with her. Dad passed 2017- had lived with them.       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 TM normal. Bridge of nose normal. Eyelids normal.  Neck: no thyromegaly or cervical lymphadenopathy  CV: RRR no murmurs rubs or gallops Lungs: CTAB no crackles, wheeze, rhonchi Abdomen: soft/nontender/nondistended/normal bowel sounds. No rebound or guarding.  Ext: no edema Skin: warm, dry Neuro: grossly normal, moves all extremities, PERRLA   Assessment and Plan    68 y.o. female presenting for annual physical.  Health Maintenance counseling: 1. Anticipatory guidance: Patient counseled regarding regular dental exams -q6 months, eye exams - yearly,  avoiding smoking and second hand smoke , limiting alcohol to 1 beverage per day - well under.   2. Risk factor reduction:  Advised patient of need for regular exercise and diet rich and fruits and vegetables to reduce risk of heart attack and stroke. Exercise- has been walking with husband most days- 30 minutes. Diet-.  Weight up 3 lbs from last physical- stressful year. She feels eating reasonably healthy - reviewed diet reported and no major issues other than skipping lunch.  Wt Readings from Last 3 Encounters:  05/04/19 201 lb 6.4 oz (91.4 kg)  04/06/19 198 lb 14.4 oz (90.2 kg)  11/18/18 193 lb (87.5 kg)  3. Immunizations/screenings/ancillary studies- flu shot today. Can get shingrix at pharmacy  Immunization History  Administered Date(s) Administered  . Influenza, High Dose Seasonal PF 05/21/2016, 04/19/2018  . Pneumococcal Conjugate-13 04/14/2016  . Pneumococcal Polysaccharide-23 04/15/2017  . Td 11/07/2008  . Tdap 11/24/2013  . Zoster 06/02/2013   4. Cervical  cancer screening-  hysterectomy and passed age based screening regardless 5. Breast cancer screening-  Ongoing treatment plan on arimidex after prior treatments.  Regular mammograms- being followed at lymph node biopsy site under right arm 6. Colon cancer screening - 05/2017 with 2 year repeat planned due to polyp history. Also has ulcerative colitis - she has not heard about updte.  7. Skin cancer screening-no dermatologist.  advised regular sunscreen use. Denies worrisome, changing, or new skin lesions.  8. Birth control/STD check-hysterectomy and monogomous 9. Osteoporosis screening at 40- 925/2020 was normal with Dr. Jana Hakim -former smoker- quit 2008. Wants to skip UA this year- will do next year  Status of chronic or  acute concerns   Celiac disease- remains gluten free as best as possible  Hypothroidism-synthroid 140mg. Update tsh today. Followed with Dr. BChalmers Caterin past.   HTN- zestoretic 20-259mcompliant. Bp high today but reports mom in hospital right now for heart attack- very high stress. Mainly 120s/80s at home but occasionally up to 140s.  On repeat controlled  Aortic atherosclerosis noted- focus on risk factor reduction. Discussed new lipid guidelines - she wants to update lipids- knows there may be a charge before deciding on statin- we discused LDL target under 70- likely start atorvastatin 2048mnce a week -mom in hospital right now with possible CAD so we want to be aggressive  The 10-year ASCVD risk score (GoMikey Bussing Jr.Brooke Bonitoet al., 2013) is: 11.2%   Values used to calculate the score:     Age: [age]     Sex: Female     Is Non-Hispanic African American: No     Diabetic: No     Tobacco smoker: No     Systolic Blood Pressure: 138426Hg     Is BP treated: Yes     HDL Cholesterol: 50 mg/dL     Total Cholesterol: 161 mg/dL  Hot flashes- good relief with gabapentin in daytime  At 100m27mlso on 300mg63mnight. Did not tolerate lexapro well- made her feel "off"   Right sided  radicular symptoms- pain from buttocks into the leg. Worse with prolonged sitting which she has to do for work. Tylenol helps some. Has tried stretching and helps some. Going on about 3 weeks.  - consider sports medicine referral if fails to improve over coming weeks or worsens.   # Dizziness S:Patient notes when stands up quickly may get lightheaded- from sitting to standing or laying to sitting.  Also if moves head quickly then she can get dizziness. Lightheaded but no room spinning.   A/P: patient drinks 36-48 oz of fluids per day encouraged to increase to at least 60 oz. If fails to improve in light of aortic atherosclerosis she is concerned about carotids and would want to get carotid ultrasound- we would see if we could get approved. She also may  Consider lifeline screen   Recommended follow up: 6 month follow up  Future Appointments  Date Time Provider DeparDonora6/2021 10:30 AM [facility] DIAG TOMO 2 [facility] CE  10/06/2019 10:40 AM [facility] US 2 [place]-BCGUS [facility] CE  05/06/2020  1:00 PM [facility] LAB 2 [facility] None  05/06/2020  1:30 PM Magrinat, GustaVirgie DadCHCC-MEDONC None   Lab/Order associations: fasting  Return precautions advised.  StephGarret Reddish

## 2019-05-02 ENCOUNTER — Telehealth: Payer: Self-pay | Admitting: Family Medicine

## 2019-05-02 NOTE — Telephone Encounter (Signed)
I left a message asking the patient to call and schedule Medicare AWV with Loma Sousa ([place]) on 05/04/2019 after seeing Dr. Yong Channel.  Im waiting for a call back to either confirm or decline the appointment. If patient calls back, please update appointment notes.  VDM (Dee-Dee)

## 2019-05-04 ENCOUNTER — Encounter: Payer: Self-pay | Admitting: Family Medicine

## 2019-05-04 ENCOUNTER — Ambulatory Visit (INDEPENDENT_AMBULATORY_CARE_PROVIDER_SITE_OTHER): Payer: Medicare HMO | Admitting: Family Medicine

## 2019-05-04 ENCOUNTER — Other Ambulatory Visit: Payer: Self-pay

## 2019-05-04 ENCOUNTER — Ambulatory Visit: Payer: Medicare HMO

## 2019-05-04 VITALS — BP 138/88 | HR 70 | Temp 97.4°F | Ht 65.5 in | Wt 201.4 lb

## 2019-05-04 DIAGNOSIS — E034 Atrophy of thyroid (acquired): Secondary | ICD-10-CM | POA: Diagnosis not present

## 2019-05-04 DIAGNOSIS — Z0000 Encounter for general adult medical examination without abnormal findings: Secondary | ICD-10-CM

## 2019-05-04 DIAGNOSIS — Z86010 Personal history of colonic polyps: Secondary | ICD-10-CM

## 2019-05-04 DIAGNOSIS — K900 Celiac disease: Secondary | ICD-10-CM

## 2019-05-04 DIAGNOSIS — I700 Atherosclerosis of aorta: Secondary | ICD-10-CM | POA: Diagnosis not present

## 2019-05-04 DIAGNOSIS — Z23 Encounter for immunization: Secondary | ICD-10-CM | POA: Diagnosis not present

## 2019-05-04 DIAGNOSIS — I10 Essential (primary) hypertension: Secondary | ICD-10-CM

## 2019-05-04 LAB — LIPID PANEL
Cholesterol: 172 mg/dL (ref 0–200)
HDL: 50.7 mg/dL (ref >39.00)
LDL Cholesterol: 100 mg/dL — ABNORMAL HIGH (ref 0–99)
NonHDL: 120.9
Total CHOL/HDL Ratio: 3
Triglycerides: 107 mg/dL (ref 0.0–149.0)
VLDL: 21.4 mg/dL (ref 0.0–40.0)

## 2019-05-04 LAB — TSH: TSH: 0.25 u[IU]/mL — ABNORMAL LOW (ref 0.35–4.50)

## 2019-05-08 ENCOUNTER — Other Ambulatory Visit: Payer: Self-pay

## 2019-05-08 MED ORDER — LEVOTHYROXINE SODIUM 112 MCG PO TABS: 112.0000 ug | ORAL_TABLET | Freq: Every day | ORAL | 3 refills | Status: DC

## 2019-06-03 DIAGNOSIS — E039 Hypothyroidism, unspecified: Secondary | ICD-10-CM | POA: Diagnosis not present

## 2019-06-03 DIAGNOSIS — Z8249 Family history of ischemic heart disease and other diseases of the circulatory system: Secondary | ICD-10-CM | POA: Diagnosis not present

## 2019-06-03 DIAGNOSIS — Z87891 Personal history of nicotine dependence: Secondary | ICD-10-CM | POA: Diagnosis not present

## 2019-06-03 DIAGNOSIS — C50919 Malignant neoplasm of unspecified site of unspecified female breast: Secondary | ICD-10-CM | POA: Diagnosis not present

## 2019-06-03 DIAGNOSIS — Z881 Allergy status to other antibiotic agents status: Secondary | ICD-10-CM | POA: Diagnosis not present

## 2019-06-03 DIAGNOSIS — Z79811 Long term (current) use of aromatase inhibitors: Secondary | ICD-10-CM | POA: Diagnosis not present

## 2019-06-03 DIAGNOSIS — E669 Obesity, unspecified: Secondary | ICD-10-CM | POA: Diagnosis not present

## 2019-06-03 DIAGNOSIS — J309 Allergic rhinitis, unspecified: Secondary | ICD-10-CM | POA: Diagnosis not present

## 2019-06-03 DIAGNOSIS — I10 Essential (primary) hypertension: Secondary | ICD-10-CM | POA: Diagnosis not present

## 2019-06-03 DIAGNOSIS — Z79899 Other long term (current) drug therapy: Secondary | ICD-10-CM | POA: Diagnosis not present

## 2019-06-07 DIAGNOSIS — Z9189 Other specified personal risk factors, not elsewhere classified: Secondary | ICD-10-CM | POA: Diagnosis not present

## 2019-06-07 DIAGNOSIS — R05 Cough: Secondary | ICD-10-CM | POA: Diagnosis not present

## 2019-06-09 ENCOUNTER — Other Ambulatory Visit: Payer: Self-pay | Admitting: Oncology

## 2019-07-26 DIAGNOSIS — M9903 Segmental and somatic dysfunction of lumbar region: Secondary | ICD-10-CM | POA: Diagnosis not present

## 2019-07-26 DIAGNOSIS — M9905 Segmental and somatic dysfunction of pelvic region: Secondary | ICD-10-CM | POA: Diagnosis not present

## 2019-07-26 DIAGNOSIS — M5116 Intervertebral disc disorders with radiculopathy, lumbar region: Secondary | ICD-10-CM | POA: Diagnosis not present

## 2019-07-26 DIAGNOSIS — M9902 Segmental and somatic dysfunction of thoracic region: Secondary | ICD-10-CM | POA: Diagnosis not present

## 2019-07-27 DIAGNOSIS — M9903 Segmental and somatic dysfunction of lumbar region: Secondary | ICD-10-CM | POA: Diagnosis not present

## 2019-07-27 DIAGNOSIS — M9905 Segmental and somatic dysfunction of pelvic region: Secondary | ICD-10-CM | POA: Diagnosis not present

## 2019-07-27 DIAGNOSIS — M9902 Segmental and somatic dysfunction of thoracic region: Secondary | ICD-10-CM | POA: Diagnosis not present

## 2019-07-27 DIAGNOSIS — M5116 Intervertebral disc disorders with radiculopathy, lumbar region: Secondary | ICD-10-CM | POA: Diagnosis not present

## 2019-07-31 DIAGNOSIS — M9903 Segmental and somatic dysfunction of lumbar region: Secondary | ICD-10-CM | POA: Diagnosis not present

## 2019-07-31 DIAGNOSIS — M9902 Segmental and somatic dysfunction of thoracic region: Secondary | ICD-10-CM | POA: Diagnosis not present

## 2019-07-31 DIAGNOSIS — M5116 Intervertebral disc disorders with radiculopathy, lumbar region: Secondary | ICD-10-CM | POA: Diagnosis not present

## 2019-07-31 DIAGNOSIS — M9905 Segmental and somatic dysfunction of pelvic region: Secondary | ICD-10-CM | POA: Diagnosis not present

## 2019-08-01 ENCOUNTER — Encounter: Payer: Self-pay | Admitting: Oncology

## 2019-08-01 DIAGNOSIS — M9905 Segmental and somatic dysfunction of pelvic region: Secondary | ICD-10-CM | POA: Diagnosis not present

## 2019-08-01 DIAGNOSIS — M9902 Segmental and somatic dysfunction of thoracic region: Secondary | ICD-10-CM | POA: Diagnosis not present

## 2019-08-01 DIAGNOSIS — M5116 Intervertebral disc disorders with radiculopathy, lumbar region: Secondary | ICD-10-CM | POA: Diagnosis not present

## 2019-08-01 DIAGNOSIS — M9903 Segmental and somatic dysfunction of lumbar region: Secondary | ICD-10-CM | POA: Diagnosis not present

## 2019-08-03 ENCOUNTER — Ambulatory Visit: Payer: Medicare HMO | Attending: Internal Medicine

## 2019-08-03 DIAGNOSIS — M9905 Segmental and somatic dysfunction of pelvic region: Secondary | ICD-10-CM | POA: Diagnosis not present

## 2019-08-03 DIAGNOSIS — Z23 Encounter for immunization: Secondary | ICD-10-CM

## 2019-08-03 DIAGNOSIS — M9903 Segmental and somatic dysfunction of lumbar region: Secondary | ICD-10-CM | POA: Diagnosis not present

## 2019-08-03 DIAGNOSIS — M5116 Intervertebral disc disorders with radiculopathy, lumbar region: Secondary | ICD-10-CM | POA: Diagnosis not present

## 2019-08-03 DIAGNOSIS — M9902 Segmental and somatic dysfunction of thoracic region: Secondary | ICD-10-CM | POA: Diagnosis not present

## 2019-08-03 NOTE — Progress Notes (Signed)
   Covid-19 Vaccination Clinic  Name:  Christine Leon    MRN: [id number] DOB: Nov 29, 1950  08/03/2019  [name] was observed post Covid-19 immunization for 15 minutes without incidence. She was provided with Vaccine Information Sheet and instruction to access the V-Safe system.   [name] was instructed to call 911 with any severe reactions post vaccine: Marland Kitchen Difficulty breathing  . Swelling of your face and throat  . A fast heartbeat  . A bad rash all over your body  . Dizziness and weakness    Immunizations Administered    Name Date Dose VIS Date Route   Pfizer COVID-19 Vaccine 08/03/2019  1:38 PM 0.3 mL 06/23/2019 Intramuscular   Manufacturer: [place]   Lot: [id number]   [place]: [id number]

## 2019-08-08 DIAGNOSIS — M5116 Intervertebral disc disorders with radiculopathy, lumbar region: Secondary | ICD-10-CM | POA: Diagnosis not present

## 2019-08-08 DIAGNOSIS — M9902 Segmental and somatic dysfunction of thoracic region: Secondary | ICD-10-CM | POA: Diagnosis not present

## 2019-08-08 DIAGNOSIS — M9903 Segmental and somatic dysfunction of lumbar region: Secondary | ICD-10-CM | POA: Diagnosis not present

## 2019-08-08 DIAGNOSIS — M9905 Segmental and somatic dysfunction of pelvic region: Secondary | ICD-10-CM | POA: Diagnosis not present

## 2019-08-09 DIAGNOSIS — M25561 Pain in right knee: Secondary | ICD-10-CM | POA: Diagnosis not present

## 2019-08-09 DIAGNOSIS — M1711 Unilateral primary osteoarthritis, right knee: Secondary | ICD-10-CM | POA: Diagnosis not present

## 2019-08-22 ENCOUNTER — Ambulatory Visit: Payer: Medicare HMO | Attending: Internal Medicine

## 2019-08-22 DIAGNOSIS — Z23 Encounter for immunization: Secondary | ICD-10-CM | POA: Insufficient documentation

## 2019-08-22 NOTE — Progress Notes (Signed)
   Covid-19 Vaccination Clinic  Name:  Christine Leon    MRN: [id number] DOB: 01-24-1951  08/22/2019  Christine Leon was observed post Covid-19 immunization for 15 minutes without incidence. She was provided with Vaccine Information Sheet and instruction to access the V-Safe system.   Christine Leon was instructed to call 911 with any severe reactions post vaccine: Marland Kitchen Difficulty breathing  . Swelling of your face and throat  . A fast heartbeat  . A bad rash all over your body  . Dizziness and weakness    Immunizations Administered    Name Date Dose VIS Date Route   Pfizer COVID-19 Vaccine 08/22/2019  9:48 AM 0.3 mL 06/23/2019 Intramuscular   Manufacturer: [place]   Lot: [id number]   [place]: [id number]

## 2019-08-29 DIAGNOSIS — M9903 Segmental and somatic dysfunction of lumbar region: Secondary | ICD-10-CM | POA: Diagnosis not present

## 2019-08-29 DIAGNOSIS — M9905 Segmental and somatic dysfunction of pelvic region: Secondary | ICD-10-CM | POA: Diagnosis not present

## 2019-08-29 DIAGNOSIS — M5116 Intervertebral disc disorders with radiculopathy, lumbar region: Secondary | ICD-10-CM | POA: Diagnosis not present

## 2019-08-29 DIAGNOSIS — M9902 Segmental and somatic dysfunction of thoracic region: Secondary | ICD-10-CM | POA: Diagnosis not present

## 2019-08-30 DIAGNOSIS — H2513 Age-related nuclear cataract, bilateral: Secondary | ICD-10-CM | POA: Diagnosis not present

## 2019-08-30 DIAGNOSIS — H524 Presbyopia: Secondary | ICD-10-CM | POA: Diagnosis not present

## 2019-08-30 DIAGNOSIS — H33303 Unspecified retinal break, bilateral: Secondary | ICD-10-CM | POA: Diagnosis not present

## 2019-08-30 DIAGNOSIS — H5213 Myopia, bilateral: Secondary | ICD-10-CM | POA: Diagnosis not present

## 2019-09-06 DIAGNOSIS — M9902 Segmental and somatic dysfunction of thoracic region: Secondary | ICD-10-CM | POA: Diagnosis not present

## 2019-09-06 DIAGNOSIS — M5116 Intervertebral disc disorders with radiculopathy, lumbar region: Secondary | ICD-10-CM | POA: Diagnosis not present

## 2019-09-06 DIAGNOSIS — M9903 Segmental and somatic dysfunction of lumbar region: Secondary | ICD-10-CM | POA: Diagnosis not present

## 2019-09-06 DIAGNOSIS — M9905 Segmental and somatic dysfunction of pelvic region: Secondary | ICD-10-CM | POA: Diagnosis not present

## 2019-09-13 DIAGNOSIS — M9902 Segmental and somatic dysfunction of thoracic region: Secondary | ICD-10-CM | POA: Diagnosis not present

## 2019-09-13 DIAGNOSIS — M5116 Intervertebral disc disorders with radiculopathy, lumbar region: Secondary | ICD-10-CM | POA: Diagnosis not present

## 2019-09-13 DIAGNOSIS — M9903 Segmental and somatic dysfunction of lumbar region: Secondary | ICD-10-CM | POA: Diagnosis not present

## 2019-09-13 DIAGNOSIS — M9905 Segmental and somatic dysfunction of pelvic region: Secondary | ICD-10-CM | POA: Diagnosis not present

## 2019-10-06 ENCOUNTER — Other Ambulatory Visit: Payer: Self-pay | Admitting: Oncology

## 2019-10-06 ENCOUNTER — Ambulatory Visit: Payer: Medicare HMO

## 2019-10-06 ENCOUNTER — Other Ambulatory Visit: Payer: Self-pay

## 2019-10-06 ENCOUNTER — Ambulatory Visit
Admission: RE | Admit: 2019-10-06 | Discharge: 2019-10-06 | Disposition: A | Discharge status: Home / Self Care | Payer: Medicare HMO | Source: Ambulatory Visit | Attending: Oncology | Admitting: Oncology

## 2019-10-06 DIAGNOSIS — R928 Other abnormal and inconclusive findings on diagnostic imaging of breast: Secondary | ICD-10-CM | POA: Diagnosis not present

## 2019-10-06 DIAGNOSIS — Z853 Personal history of malignant neoplasm of breast: Secondary | ICD-10-CM | POA: Diagnosis not present

## 2019-10-06 DIAGNOSIS — C50411 Malignant neoplasm of upper-outer quadrant of right female breast: Secondary | ICD-10-CM

## 2019-10-06 DIAGNOSIS — Z170 Estrogen receptor positive status [ER+]: Secondary | ICD-10-CM

## 2019-11-02 ENCOUNTER — Other Ambulatory Visit: Payer: Self-pay

## 2019-11-02 ENCOUNTER — Encounter: Payer: Self-pay | Admitting: Family Medicine

## 2019-11-02 ENCOUNTER — Ambulatory Visit (INDEPENDENT_AMBULATORY_CARE_PROVIDER_SITE_OTHER): Payer: Medicare HMO | Admitting: Family Medicine

## 2019-11-02 VITALS — BP 134/88 | HR 77 | Temp 98.6°F | Ht 65.5 in | Wt 202.0 lb

## 2019-11-02 DIAGNOSIS — I10 Essential (primary) hypertension: Secondary | ICD-10-CM | POA: Diagnosis not present

## 2019-11-02 DIAGNOSIS — I700 Atherosclerosis of aorta: Secondary | ICD-10-CM | POA: Diagnosis not present

## 2019-11-02 DIAGNOSIS — Z87891 Personal history of nicotine dependence: Secondary | ICD-10-CM | POA: Diagnosis not present

## 2019-11-02 DIAGNOSIS — E034 Atrophy of thyroid (acquired): Secondary | ICD-10-CM

## 2019-11-02 DIAGNOSIS — G8929 Other chronic pain: Secondary | ICD-10-CM

## 2019-11-02 DIAGNOSIS — M5441 Lumbago with sciatica, right side: Secondary | ICD-10-CM

## 2019-11-02 MED ORDER — LEVOTHYROXINE SODIUM 112 MCG PO TABS: 112.0000 ug | ORAL_TABLET | Freq: Every day | ORAL | 3 refills | Status: DC

## 2019-11-02 MED ORDER — PREDNISONE 20 MG PO TABS: ORAL_TABLET | ORAL | 0 refills | Status: DC

## 2019-11-02 NOTE — Patient Instructions (Addendum)
Change from 125 mcg of levothyroxine to 112.  Schedule lab visit in 6 weeks for recheck.  Patient follows with [facility].  If she fails to improve on course of prednisone which I am sending in today-we will place a referral to them-I would like for her to give this about 2 weeks and if not improving she will let me know.   We will call you within two weeks about your referral to lung cancer screening program. If you do not hear within 3 weeks, give [place] a call.   Keep working on healthy eating/regular exercise (when back gets better) for cholesterol.  You would like to stay off medicine for now.  I would love if you can give grandkids a heads up that they would be best if you will pick them up until we make further progress with your back.  Recommended follow up: Return in about 6 months (around 05/03/2020) for physical or sooner if needed.

## 2019-11-02 NOTE — Progress Notes (Signed)
Phone [phone] In person visit   Subjective:   Christine Leon is a 69 y.o. year old very pleasant female patient who presents for/with See problem oriented charting Chief Complaint  Patient presents with  . Hypothyroidism  . Hypertension   This visit occurred during the SARS-CoV-2 public health emergency.  Safety protocols were in place, including screening questions prior to the visit, additional usage of staff PPE, and extensive cleaning of exam room while observing appropriate contact time as indicated for disinfecting solutions.   Past Medical History-  Patient Active Problem List   Diagnosis Date Noted  . Hot flashes 08/15/2014    Priority: Medium  . Obesity 08/15/2014    Priority: Medium  . Hypothyroidism 07/19/2014    Priority: Medium  . CELIAC SPRUE 04/20/2008    Priority: Medium  . Essential hypertension 06/05/2007    Priority: Medium  . Retinal tear 08/15/2014    Priority: Low  . Left knee pain 07/25/2014    Priority: Low  . Allergic rhinitis 07/20/2007    Priority: Low  . Malignant neoplasm of upper-outer quadrant of right breast in female, estrogen receptor positive ([place]) 04/22/2018  . History of adenomatous polyp of colon 06/07/2017  . Aortic atherosclerosis ([place]) 04/15/2017    Medications- reviewed and updated Current Outpatient Medications  Medication Sig Dispense Refill  . ALPHA LIPOIC ACID PO Take 1 tablet by mouth daily.    Marland Kitchen anastrozole (ARIMIDEX) 1 MG tablet TAKE 1 TABLET BY MOUTH EVERY DAY 90 tablet 4  . Cholecalciferol (VITAMIN D-3 PO) Take 1,500 Units by mouth daily.    . fish oil-omega-3 fatty acids 1000 MG capsule Take 1 g by mouth daily.      . fluticasone (FLONASE) 50 MCG/ACT nasal spray Place 2 sprays into both nostrils daily. 16 g 6  . gabapentin (NEURONTIN) 100 MG capsule Take 1 capsule (100 mg total) by mouth 2 (two) times daily. Then take 300 mg before bed (rx from Dr. Jana Hakim) 180 capsule 3  . gabapentin (NEURONTIN) 300 MG  capsule Take 1 capsule (300 mg total) by mouth at bedtime. 90 capsule 4  . glucosamine-chondroitin 500-400 MG tablet Take 1 tablet by mouth daily.      Marland Kitchen levothyroxine (SYNTHROID) 112 MCG tablet Take 1 tablet (112 mcg total) by mouth daily. 90 tablet 3  . lisinopril-hydrochlorothiazide (ZESTORETIC) 20-25 MG tablet Take 1 tablet by mouth daily. 90 tablet 3  . vitamin B-12 (CYANOCOBALAMIN) 500 MCG tablet Take 500 mcg by mouth daily.    . predniSONE (DELTASONE) 20 MG tablet Take 2 pills for 3 days, 1 pill for 4 days 10 tablet 0   No current facility-administered medications for this visit.     Objective:  BP 134/88   Pulse 77   Temp 98.6 F (37 C) (Temporal)   Ht 5' 5.5" (1.664 m)   Wt 202 lb (91.6 kg)   LMP  (LMP Unknown)   SpO2 98%   BMI 33.10 kg/m  Gen: NAD, resting comfortably CV: RRR no murmurs rubs or gallops Lungs: CTAB no crackles, wheeze, rhonchi Ext: no edema Skin: warm, dry  Back - Normal skin, Spine with normal alignment and no deformity.  No tenderness to vertebral process palpation.  Paraspinous muscles are not tender and without spasm.   Range of motion is full at neck and lumbar sacral regions (some pain and stops about 6 inches before her toes). Negative Straight leg raise interestingly.  Neuro- no saddle anesthesia, 5/5 strength lower extremities  Assessment and Plan   # Chronic Back Pain with sciatica on right S:Patient had fall in December. Missed step coming down some stairs at that time and pain started then.   Pain rating, quality, Location- low back pain right more than left. Pain goes down back of leg into foot, occasional pain into left leg. . Pain right now is 6/10 but can get to 10/10.  Started, duration- started in December and has been issues continuously . Improved with chiropractor- then worsened when caring for grandkids Worse with - sitting, picking grandkids up.  Relieved by- otc medications- tylenol some help. Laying down helps.  Previous  Treatment- ice, heat, chiropractor (has loosened up things that helped her), otc meds. tried egronomic chair but not helping. Was on gabapentin 321m before this started.  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  Previous imaging- She did have x ray at chiropractor but no other imaging- she reports bulging disc  ROS-No saddle anesthesia, bladder incontinence, fecal incontinence, weakness in extremity, numbness or tingling in extremity- other than occasional numbness into right leg. History positive for trauma. no history of cancer- other than treated breast cancer, fever, chills, unintentional weight loss, recent bacterial infection, recent IV drug use, HIV, pain worse at night or while supine.  A/P: Right low back pain with sciatica into the right leg.  She has been told she has a bulging disc by chiropractor.  May need repeat imaging or MRI depending on progress -Patient follows with [facility].  If she fails to improve on course of prednisone which I am sending in today-we will place a referral to them-I would like for her to give this about 2 weeks and if not improving she will let me know. - she is going to continue the stretches she was given by chiropractor - has grandkids for 5 days coming up- encouraged her to try to take it easy  #Lung cancer screening-patient's last imaging was in 2018.  She did not get a call back in 2019 and would like to get reestablished-referral was placed today.  She has some very mild shortness of breath-could be deconditioning related as she has not been able to be active with her back but this imaging should be adequate.  If she has chest pain or drastically worsening symptoms asked her to follow-up with [place]  #hyperlipidemia/aortic atherosclerosis S: Medication:none for now  Lab Results  Component Value Date   CHOL 172 05/04/2019   HDL 50.70 05/04/2019   LDLCALC 100 (H) 05/04/2019   TRIG 107.0 05/04/2019   CHOLHDL 3 05/04/2019    A/P: 10 year ascvd risk is 11.4% based off last labs- she does have aortic atherosclerosis so we had suggested considering atorvastatin 20 mg weekly- she wants to focus on lifestyle and we can recheck at physical  #hypothyroidism S: compliant On thyroid medication-levothyroxine 125 mcg.  Patient was never started on the 112MCG. Was sent to a different pharmacy than she intended to pick up from and thus she never started it. Lab Results  Component Value Date   TSH 0.25 (L) 05/04/2019   ROS-No hair or nail changes. No heat/cold intolerance. No constipation or diarrhea. Denies shakiness or anxiety.  A/P:Suspect still poorly controlled.  We have sent in levothyroxine to correct pharmacy  #hypertension S: medication: controlled on lisinopril hctz 20-25 mg. Patient denies any chest pain, shortness of breath, changes or changes in vision. Does  add salt to food.   Home  readings #s:  in the 130/80 range.  BP Readings from Last 3 Encounters:  11/02/19 134/88  05/04/19 138/88  04/06/19 (!) 149/57  A/P:  Stable. Continue current medications.    #Very mild ALT elevation.  AST was normal.  Usually high ALT to AST ratio can be a sign of mild fatty liver-she is working on Oceanographer exercise/weight loss.  Well exercise on pause because of the back.  We will recheck this with labs today  Recommended follow up: Return in about 6 months (around 05/03/2020) for physical or sooner if needed. Future Appointments  Date Time Provider [place]  04/08/2020  1:30 PM [facility] DIAG TOMO 2 [facility]BREAST CE  05/06/2020  1:00 PM [facility] LAB 2 [facility] None  05/06/2020  1:30 PM Magrinat, Virgie Dad, MD [facility] None    Lab/Order associations:   ICD-10-CM   1. Former smoker  Z87.891 Ambulatory Referral for Lung Cancer Scre  2. Hypothyroidism due to acquired atrophy of thyroid  E03.4 TSH  3. Essential hypertension  I10 Comprehensive metabolic panel    Meds ordered this encounter   Medications  . levothyroxine (SYNTHROID) 112 MCG tablet    Sig: Take 1 tablet (112 mcg total) by mouth daily.    Dispense:  90 tablet    Refill:  3  . predniSONE (DELTASONE) 20 MG tablet    Sig: Take 2 pills for 3 days, 1 pill for 4 days    Dispense:  10 tablet    Refill:  0   Return precautions advised.  Garret Reddish, MD

## 2019-11-08 DIAGNOSIS — R69 Illness, unspecified: Secondary | ICD-10-CM | POA: Diagnosis not present

## 2019-11-09 ENCOUNTER — Other Ambulatory Visit: Payer: Self-pay | Admitting: Family Medicine

## 2019-11-09 NOTE — Telephone Encounter (Signed)
Plan was to see [facility] after course of prednisone if did not have significant improvement/resolution-I need a status update before prescribing this

## 2019-11-09 NOTE — Telephone Encounter (Signed)
Pt requesting Prednisone 21m tab  LOV: 11/02/2019 Next Visit: 05/06/20  Last refill 11/02/2019   Approve?

## 2019-11-10 NOTE — Telephone Encounter (Signed)
Patient is calling back wanting another round of prednisone 20 mg, patient states it did help her but she is taking care of grand children and its flared back up. She has not made an appointment with [facility] as of now. Please advise. jk

## 2019-11-10 NOTE — Telephone Encounter (Signed)
Not able to leave v/m mail box [place] full.

## 2019-11-16 DIAGNOSIS — R69 Illness, unspecified: Secondary | ICD-10-CM | POA: Diagnosis not present

## 2019-11-16 NOTE — Telephone Encounter (Signed)
Called pt to see if she needed an appointment. Pt stated she is going to try and go without the prednisone. Pt stated she will call if she needs to be seen.

## 2019-11-16 NOTE — Telephone Encounter (Signed)
Can you call and see if able to reach patient and see if she needs app.

## 2019-11-23 ENCOUNTER — Other Ambulatory Visit: Payer: Self-pay | Admitting: Family Medicine

## 2019-11-23 ENCOUNTER — Telehealth: Payer: Self-pay | Admitting: Acute Care

## 2019-11-23 NOTE — Telephone Encounter (Signed)
Last OV 11/02/19 Currently taking Levothyroxine 112 mcg refilled 11/02/19 #90/3

## 2019-11-23 NOTE — Telephone Encounter (Signed)
Spoke with pt and advised that she will not qualify for lung screening at this time due to her breast cancer dx in 2019. Pt will need to be cancer free for 5 years to re qualify for lung screening. PT verbalized understanding. Note sent to Dr Yong Channel to make him aware.

## 2019-11-29 ENCOUNTER — Telehealth: Payer: Self-pay

## 2019-11-29 NOTE — Telephone Encounter (Signed)
Called and spoke with pt to see if she was interested in having a chest xray done since her lung cancer screening got denied. She states that right now she is kind of busy and dosen't wish to have this done at the moment, she states she will discuss this at the time of her next OV with Dr. Yong Channel and go from there.

## 2020-01-08 DIAGNOSIS — I10 Essential (primary) hypertension: Secondary | ICD-10-CM | POA: Diagnosis not present

## 2020-01-08 DIAGNOSIS — B001 Herpesviral vesicular dermatitis: Secondary | ICD-10-CM | POA: Diagnosis not present

## 2020-01-08 DIAGNOSIS — L239 Allergic contact dermatitis, unspecified cause: Secondary | ICD-10-CM | POA: Diagnosis not present

## 2020-01-08 DIAGNOSIS — Z0131 Encounter for examination of blood pressure with abnormal findings: Secondary | ICD-10-CM | POA: Diagnosis not present

## 2020-01-09 ENCOUNTER — Telehealth: Payer: Medicare HMO | Admitting: Family Medicine

## 2020-01-12 ENCOUNTER — Ambulatory Visit (INDEPENDENT_AMBULATORY_CARE_PROVIDER_SITE_OTHER): Payer: Medicare HMO | Admitting: Family Medicine

## 2020-01-12 ENCOUNTER — Encounter: Payer: Self-pay | Admitting: Family Medicine

## 2020-01-12 ENCOUNTER — Other Ambulatory Visit: Payer: Self-pay

## 2020-01-12 VITALS — BP 142/78 | HR 64 | Temp 97.6°F | Ht 65.5 in | Wt 205.0 lb

## 2020-01-12 DIAGNOSIS — L2489 Irritant contact dermatitis due to other agents: Secondary | ICD-10-CM | POA: Diagnosis not present

## 2020-01-12 MED ORDER — HYDROXYZINE HCL 25 MG PO TABS: 25.0000 mg | ORAL_TABLET | Freq: Three times a day (TID) | ORAL | 0 refills | Status: DC | PRN

## 2020-01-12 MED ORDER — TRIAMCINOLONE ACETONIDE 0.5 % EX CREA: 1.0000 "application " | TOPICAL_CREAM | Freq: Two times a day (BID) | CUTANEOUS | 0 refills | Status: DC

## 2020-01-12 NOTE — Progress Notes (Signed)
Patient: Christine Leon MRN: [id number] DOB: [birth year] PCP: Marin Olp, MD     Subjective:  Chief Complaint  Patient presents with  . Rash    HPI: The patient is a 69 y.o. female who presents today for rash. She states her rash started at night. Started on her face and when she woke up the next morning it was all over the left side of her neck. [facility]. They gave her steroids and valtrex. She is on the 5th day of steroids and rash is only getting worse and getting all over her body.  Before rash started she was out in her garden trimming roses. She has no poison ivy/oak in her yard and knows what this looks like. She did not have gloves on. Her neighbor was also burning something and she had a strong smell of smoke in the air as well.  She denies any new detergents, soaps, liquids, food. No new jewelry/perfumes/lotions.   Rash itches her and feels like it is on fire at night. She has put hydrocortisone cream at night.   Review of Systems  Constitutional: Negative for fever.  Respiratory: Negative for shortness of breath and wheezing.   Gastrointestinal: Negative for abdominal pain, nausea and vomiting.  Musculoskeletal: Negative for joint swelling.  Skin: Positive for rash. Negative for wound.    Allergies Patient is allergic to gluten meal, wheat bran, and cephalexin.  Past Medical History Patient  has a past medical history of Allergy, Celiac sprue, History of radiation therapy (05/26/18- 06/29/18), Hypertension, Hypothyroidism, Personal history of radiation therapy (2019), and PONV (postoperative nausea and vomiting).  Surgical History Patient  has a past surgical history that includes Cholecystectomy; Abdominal hysterectomy; Tonsillectomy; Oophorectomy; arthroscopic knee surgery; Coccyx removal; Breast lumpectomy with radioactive seed and sentinel lymph node biopsy (Right, 04/27/2018); Breast biopsy (Right,  04/01/2018); and Breast lumpectomy (Right, 2019).  Family History Pateint's family history includes Breast cancer in her maternal aunt; COPD in her father; Coronary artery disease in her father; Hypertension in her mother; Pulmonary fibrosis in her father and paternal aunt.  Social History Patient  reports that she quit smoking about 13 years ago. Her smoking use included cigarettes. She has a 39.00 pack-year smoking history. She has never used smokeless tobacco. She reports current alcohol use of about 1.0 standard drink of alcohol per week. She reports that she does not use drugs.    Objective: Vitals:   01/12/20 1302  BP: (!) 142/78  Pulse: 64  Temp: 97.6 F (36.4 C)  TempSrc: Temporal  SpO2: 94%  Weight: 205 lb (93 kg)  Height: 5' 5.5" (1.664 m)    Body mass index is 33.59 kg/m.  Physical Exam Vitals reviewed.  Constitutional:      Appearance: Normal appearance. She is obese.  HENT:     Head: Normocephalic and atraumatic.  Pulmonary:     Effort: Pulmonary effort is normal.  Skin:    Findings: Rash (erythematous streaking rash on right cheek, erythematous macular/plaque like rash over left side of neck, between breasts, right forearm and legs. no vesicles or blistering ) present.  Neurological:     General: No focal deficit present.     Mental Status: She is alert and oriented to person, place, and time.        Assessment/plan: 1. Irritant contact dermatitis due to other agents She does not have shingles. Appears to be a contact dermatitis possibly from old books/dusts/etc that  she worked with vs. Something outside. Continue high dose steroid taper over 3 weeks. Recommended cool, short oatmeal baths for itching as well as wet dressings followed by steroid cream. Discussed not to use steroid cream on face, but can continue hydrocortisone cream. Zyrtec BID and hydroxyzine at night. Drowsy precautions given.  Discussed should run it's course in 10 days to 3 weeks, if not  return for biopsy.    This visit occurred during the SARS-CoV-2 public health emergency.  Safety protocols were in place, including screening questions prior to the visit, additional usage of staff PPE, and extensive cleaning of exam room while observing appropriate contact time as indicated for disinfecting solutions.    Return if symptoms worsen or fail to improve.   Orma Flaming, MD [place]   01/12/2020

## 2020-01-12 NOTE — Patient Instructions (Signed)
-can start zyrtec 81m twice a day - sent in prescription called hydroxzyine to help itching. Can use at night and will hopefully help you sleep.  -continue steroids over long taper -sent in steroid cream to put on rash. CAN NOT USE ON FACE, but can use over the counter hydrocortisone cream.   -short, cool tub baths with or without colloidal oatmeal (aveeno), are soothing for the itching -cool wet dressings with tap water are very effective for blisters or severe itching. Apply for 15-30 minutes several times a day and can apply steroid cream afterwards.   This usually will run it's course after 10 days to 3 weeks, if not please come back for biopsy.    Contact Dermatitis Dermatitis is redness, soreness, and swelling (inflammation) of the skin. Contact dermatitis is a reaction to something that touches the skin. There are two types of contact dermatitis:  Irritant contact dermatitis. This happens when something bothers (irritates) your skin, like soap.  Allergic contact dermatitis. This is caused when you are exposed to something that you are allergic to, such as poison ivy. What are the causes?  Common causes of irritant contact dermatitis include: ? Makeup. ? Soaps. ? Detergents. ? Bleaches. ? Acids. ? Metals, such as nickel.  Common causes of allergic contact dermatitis include: ? Plants. ? Chemicals. ? Jewelry. ? Latex. ? Medicines. ? Preservatives in products, such as clothing. What increases the risk?  Having a job that exposes you to things that bother your skin.  Having asthma or eczema. What are the signs or symptoms? Symptoms may happen anywhere the irritant has touched your skin. Symptoms include:  Dry or flaky skin.  Redness.  Cracks.  Itching.  Pain or a burning feeling.  Blisters.  Blood or clear fluid draining from skin cracks. With allergic contact dermatitis, swelling may occur. This may happen in places such as the eyelids, mouth, or  genitals. How is this treated?  This condition is treated by checking for the cause of the reaction and protecting your skin. Treatment may also include: ? Steroid creams, ointments, or medicines. ? Antibiotic medicines or other ointments, if you have a skin infection. ? Lotion or medicines to help with itching. ? A bandage (dressing). Follow these instructions at home: Skin care  Moisturize your skin as needed.  Put cool cloths on your skin.  Put a baking soda paste on your skin. Stir water into baking soda until it looks like a paste.  Do not scratch your skin.  Avoid having things rub up against your skin.  Avoid the use of soaps, perfumes, and dyes. Medicines  Take or apply over-the-counter and prescription medicines only as told by your doctor.  If you were prescribed an antibiotic medicine, take or apply it as told by your doctor. Do not stop using it even if your condition starts to get better. Bathing  Take a bath with: ? Epsom salts. ? Baking soda. ? Colloidal oatmeal.  Bathe less often.  Bathe in warm water. Avoid using hot water. Bandage care  If you were given a bandage, change it as told by your health care provider.  Wash your hands with soap and water before and after you change your bandage. If soap and water are not available, use hand sanitizer. General instructions  Avoid the things that caused your reaction. If you do not know what caused it, keep a journal. Write down: ? What you eat. ? What skin products you use. ? What you drink. ?  What you wear in the area that has symptoms. This includes jewelry.  Check the affected areas every day for signs of infection. Check for: ? More redness, swelling, or pain. ? More fluid or blood. ? Warmth. ? Pus or a bad smell.  Keep all follow-up visits as told by your doctor. This is important. Contact a doctor if:  You do not get better with treatment.  Your condition gets worse.  You have signs of  infection, such as: ? More swelling. ? Tenderness. ? More redness. ? Soreness. ? Warmth.  You have a fever.  You have new symptoms. Get help right away if:  You have a very bad headache.  You have neck pain.  Your neck is stiff.  You throw up (vomit).  You feel very sleepy.  You see red streaks coming from the area.  Your bone or joint near the area hurts after the skin has healed.  The area turns darker.  You have trouble breathing. Summary  Dermatitis is redness, soreness, and swelling of the skin.  Symptoms may occur where the irritant has touched you.  Treatment may include medicines and skin care.  If you do not know what caused your reaction, keep a journal.  Contact a doctor if your condition gets worse or you have signs of infection. This information is not intended to replace advice given to you by your health care provider. Make sure you discuss any questions you have with your health care provider. Document Revised: 10/19/2018 Document Reviewed: 01/12/2018 Elsevier Patient Education  [place].

## 2020-01-16 ENCOUNTER — Telehealth: Payer: Self-pay | Admitting: Family Medicine

## 2020-01-16 ENCOUNTER — Encounter: Payer: Self-pay | Admitting: Family Medicine

## 2020-01-16 ENCOUNTER — Telehealth (INDEPENDENT_AMBULATORY_CARE_PROVIDER_SITE_OTHER): Payer: Medicare HMO | Admitting: Family Medicine

## 2020-01-16 VITALS — Wt 203.0 lb

## 2020-01-16 DIAGNOSIS — R21 Rash and other nonspecific skin eruption: Secondary | ICD-10-CM

## 2020-01-16 DIAGNOSIS — K900 Celiac disease: Secondary | ICD-10-CM

## 2020-01-16 DIAGNOSIS — G4700 Insomnia, unspecified: Secondary | ICD-10-CM | POA: Diagnosis not present

## 2020-01-16 NOTE — Telephone Encounter (Signed)
Pt called stating she saw Dr. Maudie Mercury this morning for a rash. Pt stated she was not prescribed anything and asked if she could see Dr. Yong Channel. Informed pt that Dr. Yong Channel did not have any appts available for today. Pt states she is going to go to an urgent care.

## 2020-01-16 NOTE — Telephone Encounter (Signed)
Fyi.

## 2020-01-16 NOTE — Progress Notes (Addendum)
Virtual Visit via Video Note  I connected with Christine Leon  on 01/16/20 at 11:35 AM EDT by a video enabled telemedicine application and verified that I am speaking with the correct person using two identifiers.  Location patient: home, [place] Location provider:work or home office Persons participating in the virtual visit: patient, provider, husband  I discussed the limitations of evaluation and management by telemedicine and the availability of in person appointments. The patient expressed understanding and agreed to proceed.   HPI:  Acute visit for a Rash: -started about 9-10 days ago -symptoms: very itchy rash on the torso, leg, neck, face, eyelids, somewhat blistery rash -went [facility] and was told it was shingles or poison ivy and was treated with steroid and an antiviral -she has celiac disease and she thinks this is a reaction to gluten, she sees Dr. Havery Moros - but reports is behind on her colonoscopy due to the pandemic, gets one que 2 year -she went to her PCP office last week on 7/2 and got a steroid cream (per notes from there appeared more actopic without a vesicles) - she did not see her regular provider -she is on a steroid taper, zyrtec, hydroxyzine and a topical steroid -she does not feel it has improved -denies upset stomach, cough, congestion, wheezing, SOB, NVD, fevers, malaise - but feels sleep has not been good due to the itchiness -can't think of new exposures but was doing gardening before rash started  ROS: See pertinent positives and negatives per HPI.  Past Medical History:  Diagnosis Date  . Allergy   . Celiac sprue   . History of radiation therapy 05/26/18- 06/29/18   Right Breast 15 fractions for a total dose of 40.05 Gy. Right Breast boost 5 fractions for a total dose of 10 Gy  . Hypertension   . Hypothyroidism   . Personal history of radiation therapy 2019  . PONV (postoperative nausea and vomiting)     Past Surgical History:  Procedure Laterality  Date  . ABDOMINAL HYSTERECTOMY     in 58s. fibroids. including cervix removal. per patient was told no further pap smears after.   Marland Kitchen arthroscopic knee surgery     2015 under Dr. Theda Sers  . BREAST BIOPSY Right 04/01/2018  . BREAST LUMPECTOMY Right 2019  . BREAST LUMPECTOMY WITH RADIOACTIVE SEED AND SENTINEL LYMPH NODE BIOPSY Right 04/27/2018   Procedure: RIGHT BREAST LUMPECTOMY WITH RADIOACTIVE SEED AND RIGHT AXILLARYSENTINEL LYMPH NODE BIOPSY ERAS PATHWAY;  Surgeon: Rolm Bookbinder, MD;  Location: [place];  Service: General;  Laterality: Right;  PECTORAL BLOCK  . CHOLECYSTECTOMY    . COCCYX REMOVAL     after cheerleading accident  . OOPHORECTOMY     in 4s  . TONSILLECTOMY      Family History  Problem Relation Age of Onset  . Hypertension Mother   . Coronary artery disease Father        29  . COPD Father        passed age 36  . Pulmonary fibrosis Father   . Breast cancer Maternal Aunt   . Pulmonary fibrosis Paternal Aunt     SOCIAL HX: see hpi   Current Outpatient Medications:  .  ALPHA LIPOIC ACID PO, Take 1 tablet by mouth daily., Disp: , Rfl:  .  anastrozole (ARIMIDEX) 1 MG tablet, TAKE 1 TABLET BY MOUTH EVERY DAY, Disp: 90 tablet, Rfl: 4 .  Cholecalciferol (VITAMIN D-3 PO), Take 1,500 Units by mouth daily., Disp: , Rfl:  .  fish oil-omega-3 fatty acids 1000 MG capsule, Take 1 g by mouth daily.  , Disp: , Rfl:  .  fluticasone (FLONASE) 50 MCG/ACT nasal spray, Place 2 sprays into both nostrils daily., Disp: 16 g, Rfl: 6 .  gabapentin (NEURONTIN) 100 MG capsule, Take 1 capsule (100 mg total) by mouth 2 (two) times daily. Then take 300 mg before bed (rx from Dr. Jana Hakim), Disp: 180 capsule, Rfl: 3 .  gabapentin (NEURONTIN) 300 MG capsule, Take 1 capsule (300 mg total) by mouth at bedtime., Disp: 90 capsule, Rfl: 4 .  glucosamine-chondroitin 500-400 MG tablet, Take 1 tablet by mouth daily.  , Disp: , Rfl:  .  hydrOXYzine (ATARAX/VISTARIL) 25 MG tablet,  Take 1 tablet (25 mg total) by mouth 3 (three) times daily as needed for itching., Disp: 45 tablet, Rfl: 0 .  levothyroxine (SYNTHROID) 112 MCG tablet, Take 1 tablet (112 mcg total) by mouth daily., Disp: 90 tablet, Rfl: 3 .  lisinopril-hydrochlorothiazide (ZESTORETIC) 20-25 MG tablet, Take 1 tablet by mouth daily., Disp: 90 tablet, Rfl: 3 .  predniSONE (DELTASONE) 20 MG tablet, Take 2 pills for 3 days, 1 pill for 4 days, Disp: 10 tablet, Rfl: 0 .  triamcinolone cream (KENALOG) 0.5 %, Apply 1 application topically 2 (two) times daily. 10 days on then off for 7 days, Disp: 60 g, Rfl: 0 .  vitamin B-12 (CYANOCOBALAMIN) 500 MCG tablet, Take 500 mcg by mouth daily., Disp: , Rfl:   EXAM:  VITALS per patient if applicable:  GENERAL: alert, oriented, appears well and in no acute distress  HEENT: atraumatic, conjunttiva clear, no obvious abnormalities on inspection of external nose and ears  NECK: normal movements of the head and neck  LUNGS: on inspection no signs of respiratory distress, breathing rate appears normal, no obvious gross SOB, gasping or wheezing  CV: no obvious cyanosis  MS: moves all visible extremities without noticeable abnormality  SKIN: lightly erythematous plaques on the face, neck - these were the areas she showed me, I do not appreciate any vesicles  PSYCH/NEURO: pleasant and cooperative, no obvious depression or anxiety, speech and thought processing grossly intact  ASSESSMENT AND PLAN:  Discussed the following assessment and plan:  Rash - Plan: Ambulatory referral to Dermatology  Celiac disease - Plan: Ambulatory referral to Dermatology  -we discussed possible serious and likely etiologies, options for evaluation and workup, limitations of telemedicine visit vs in person visit, treatment, treatment risks and precautions. Pt prefers to treat via telemedicine empirically rather then risking or undertaking an in person visit at this moment. Patient is concerned this  could be DH and is requesting dapsone. Appearance currently from what I can see on limited exam is not classic for that and appears more like atopic rash or urticaria vs other. Discussed options and placed dermatology referral. Also, reached out to PCP and GI specialist as feel needs inperson evaluation and given she adamantly wants dapsone asap. PCP is going to squeeze her in for inperson visit with him this week. She needs GI f/u and advised her to stick to strict GF diet and follow up with GI promptly.  Advised to contact PCP office if is not contacted regarding the referral in next several days. Patient agrees to seek prompt in person care in the interim if worsening or new symptoms arise.   I discussed the assessment and treatment plan with the patient. The patient was provided an opportunity to ask questions and all were answered. The patient agreed with the plan  and demonstrated an understanding of the instructions.   The patient was advised to call back or seek an in-person evaluation if the symptoms worsen or if the condition fails to improve as anticipated.   Lucretia Kern, DO

## 2020-01-17 ENCOUNTER — Telehealth: Payer: Self-pay

## 2020-01-17 NOTE — Telephone Encounter (Signed)
Called and spoke to pt. She has an appt with Dermatology tomorrow, 01-18-20, for her blisters that she believes is related to Celiac.  She was agreeable to a GI f/u and has been scheduled for an appt with Dr. Havery Moros on 8-24.

## 2020-01-17 NOTE — Telephone Encounter (Signed)
-----   Message from Yetta Flock, MD sent at 01/17/2020  1:01 PM EDT ----- Hi. Thanks for the message, sorry to hear about this. She did have some active celiac during her last endoscopy but that was a few years ago. Could be DH but I agree she should really be seen by Derm for this to confirm. If she is not 100% gluten free compliant then that is the the first thing she needs to do. Otherwise I haven't prescribed dapsone before, usually derm manages that, unless one of you feel comfortable otherwise could give her 58m / day empirically and monitor closely if she can't get into derm anytime soon. She is due for routine follow [place] with [place] can coordinate that. Obtaining a TTG IgA level may be useful, if it's normal then her celiac isn't too active. If she has refractory celiac despite adhering to gluten free diet I will need to discuss options with her for that as well. Thanks  Jan can you help coordinate an office follow up with me? Thanks   SRichardson Landry----- Message ----- From: KLucretia Kern DO Sent: 01/16/2020  11:57 AM EDT To: SMarin Olp MD, SYetta Flock MD  Hi folks,  SQuillian Quincefor a telemedicine visit for a rash. [facility]. She feels this is Dermatitis herpetiformis and is urgently and adamantly wanting to start dapsone. I am not sure from the video visit - does not look classic for that to me, but exam was limited due to poor video quality.   I set sent a message to the schedulers to try to set up an in person visit with you Dr. HYong Channel I also let her know I would reach out to you Dr. A[facility]very Morosto [facility] ever in your [facility] patients to see if you could see her. Looks like she is due for GI f/u regardless. She admits she has not been good with her diet.  I advised a strict gluten free diet and sent a referral to dermatology as she has already tried oral and topical steroids, hydroxyzine and  zyrtec and still is itchy.   I did not feel comfortable prescribing dapsone given the limited exam I could do via video and an unclear dx.    Thanks for any help you all might be able to provide.  HJarrett Soho

## 2020-01-17 NOTE — Telephone Encounter (Signed)
Wonderful, thanks Jan

## 2020-01-18 DIAGNOSIS — L239 Allergic contact dermatitis, unspecified cause: Secondary | ICD-10-CM | POA: Diagnosis not present

## 2020-01-18 NOTE — Telephone Encounter (Signed)
Thank you all 

## 2020-01-19 ENCOUNTER — Other Ambulatory Visit: Payer: Self-pay | Admitting: Family Medicine

## 2020-01-21 ENCOUNTER — Other Ambulatory Visit: Payer: Self-pay | Admitting: Family Medicine

## 2020-01-23 ENCOUNTER — Ambulatory Visit: Payer: Medicare HMO | Admitting: Family Medicine

## 2020-01-26 ENCOUNTER — Encounter: Payer: Self-pay | Admitting: Family Medicine

## 2020-01-29 ENCOUNTER — Telehealth: Payer: Self-pay | Admitting: Family Medicine

## 2020-01-29 NOTE — Telephone Encounter (Signed)
Please see below. She has not read off my chart..  Thanks!  I just wanted to touch base with you and see if you are still needing the hydroxyzine pills. Looks like you were going to see dermatology for the rash. I hope it's not from the inhaled poison ivy again!!  I have a lot of paperwork from medicare to fill out for the hydroxyzine pills and would be happy to send in if these pills are helping the itching. Just let me know so I can get this faxed over.  Thanks,  Dr. Rogers Blocker

## 2020-01-29 NOTE — Telephone Encounter (Signed)
Attempted to lvm to give pt the message below. Pt's vm is full. I could not lvm.

## 2020-02-16 ENCOUNTER — Telehealth: Payer: Self-pay | Admitting: Family Medicine

## 2020-02-16 NOTE — Progress Notes (Signed)
  Chronic Care Management   Outreach Note  02/16/2020 Name: Christine Leon [facility] MRN: [id number] DOB: [birth year]  Referred by: Marin Olp, MD Reason for referral : No chief complaint on file.   An unsuccessful telephone outreach was attempted today. The patient was referred to the pharmacist for assistance with care management and care coordination.   Follow Up Plan:   Earney Hamburg Upstream Scheduler

## 2020-02-16 NOTE — Progress Notes (Signed)
°  Chronic Care Management   Note  02/16/2020 Name: Mallary Kreger [facility] MRN: [id number] DOB: Jul 18, 1950  Maresha Anastos Kinlaw is a 68 y.o. year old female who is a primary care patient of Marin Olp, MD. I reached out to Marthenia Rolling Grandstaff by phone today in response to a referral sent by Ms. Marthenia Rolling Dunkleberger's PCP, Yong Channel [name], MD.   Ms. Grossi was given information about Chronic Care Management services today including:  1. CCM service includes personalized support from designated clinical staff supervised by her physician, including individualized plan of care and coordination with other care providers 2. 24/7 contact phone numbers for assistance for urgent and routine care needs. 3. Service will only be billed when office clinical staff spend 20 minutes or more in a month to coordinate care. 4. Only one practitioner may furnish and bill the service in a calendar month. 5. The patient may stop CCM services at any time (effective at the end of the month) by phone call to the office staff.   Patient agreed to services and verbal consent obtained.   Follow up plan:  Earney Hamburg Upstream Scheduler

## 2020-02-19 ENCOUNTER — Other Ambulatory Visit: Payer: Self-pay | Admitting: Oncology

## 2020-02-20 DIAGNOSIS — R69 Illness, unspecified: Secondary | ICD-10-CM | POA: Diagnosis not present

## 2020-03-01 ENCOUNTER — Other Ambulatory Visit: Payer: Self-pay | Admitting: Family Medicine

## 2020-03-05 ENCOUNTER — Ambulatory Visit: Payer: Medicare HMO | Admitting: Gastroenterology

## 2020-03-05 ENCOUNTER — Other Ambulatory Visit: Payer: Self-pay | Admitting: Family Medicine

## 2020-03-29 ENCOUNTER — Other Ambulatory Visit: Payer: Self-pay | Admitting: *Deleted

## 2020-03-29 DIAGNOSIS — I10 Essential (primary) hypertension: Secondary | ICD-10-CM

## 2020-03-29 DIAGNOSIS — E034 Atrophy of thyroid (acquired): Secondary | ICD-10-CM

## 2020-04-01 ENCOUNTER — Telehealth: Payer: Medicare HMO

## 2020-04-01 ENCOUNTER — Ambulatory Visit: Payer: Medicare HMO

## 2020-04-01 ENCOUNTER — Telehealth: Payer: Self-pay | Admitting: Family Medicine

## 2020-04-01 NOTE — Chronic Care Management (AMB) (Signed)
  Chronic Care Management   Outreach Note  04/01/2020 Name: Niala Stcharles [facility] MRN: [id number] DOB: [birth year]  Referred by: Marin Olp, MD Reason for referral : No chief complaint on file.   An unsuccessful telephone outreach was attempted today. The patient was referred to the pharmacist for assistance with care management and care coordination.   Follow Up Plan:   Lauretta Grill Upstream Scheduler

## 2020-04-01 NOTE — Chronic Care Management (AMB) (Signed)
error 

## 2020-04-05 ENCOUNTER — Telehealth: Payer: Self-pay | Admitting: Family Medicine

## 2020-04-05 NOTE — Progress Notes (Signed)
  Chronic Care Management   Outreach Note  04/05/2020 Name: Christine Leon [facility] MRN: [id number] DOB: [birth year]  Referred by: Marin Olp, MD Reason for referral : No chief complaint on file.   A second unsuccessful telephone outreach was attempted today. The patient was referred to pharmacist for assistance with care management and care coordination.  Follow Up Plan:   Lauretta Grill Upstream Scheduler

## 2020-04-08 ENCOUNTER — Ambulatory Visit
Admission: RE | Admit: 2020-04-08 | Discharge: 2020-04-08 | Disposition: A | Discharge status: Home / Self Care | Payer: Medicare HMO | Source: Ambulatory Visit | Attending: Oncology | Admitting: Oncology

## 2020-04-08 ENCOUNTER — Telehealth: Payer: Self-pay | Admitting: Family Medicine

## 2020-04-08 ENCOUNTER — Other Ambulatory Visit: Payer: Self-pay | Admitting: Oncology

## 2020-04-08 ENCOUNTER — Other Ambulatory Visit: Payer: Self-pay

## 2020-04-08 DIAGNOSIS — C50411 Malignant neoplasm of upper-outer quadrant of right female breast: Secondary | ICD-10-CM

## 2020-04-08 DIAGNOSIS — Z853 Personal history of malignant neoplasm of breast: Secondary | ICD-10-CM | POA: Diagnosis not present

## 2020-04-08 DIAGNOSIS — Z170 Estrogen receptor positive status [ER+]: Secondary | ICD-10-CM

## 2020-04-08 DIAGNOSIS — N6489 Other specified disorders of breast: Secondary | ICD-10-CM | POA: Diagnosis not present

## 2020-04-08 DIAGNOSIS — R928 Other abnormal and inconclusive findings on diagnostic imaging of breast: Secondary | ICD-10-CM | POA: Diagnosis not present

## 2020-04-08 NOTE — Chronic Care Management (AMB) (Signed)
  Chronic Care Management   Note  04/08/2020 Name: Christine Leon [facility] MRN: [id number] DOB: May 09, 1951  Christine Leon is a 69 y.o. year old female who is a primary care patient of Marin Olp, MD. I reached out to Christine Leon by phone today in response to a referral sent by Christine Leon's PCP, Yong Channel [name], MD.   Christine Leon was given information about Chronic Care Management services today including:  1. CCM service includes personalized support from designated clinical staff supervised by Christine Leon physician, including individualized plan of care and coordination with other care providers 2. 24/7 contact phone numbers for assistance for urgent and routine care needs. 3. Service will only be billed when office clinical staff spend 20 minutes or more in a month to coordinate care. 4. Only one practitioner may furnish and bill the service in a calendar month. 5. The patient may stop CCM services at any time (effective at the end of the month) by phone call to the office staff.   Patient did not agree to enrollment in care management services and does not wish to consider at this time.  Follow up plan:   SIGNATURE

## 2020-04-24 ENCOUNTER — Ambulatory Visit: Payer: Medicare HMO | Admitting: Gastroenterology

## 2020-04-24 ENCOUNTER — Encounter: Payer: Self-pay | Admitting: Gastroenterology

## 2020-04-24 ENCOUNTER — Other Ambulatory Visit (INDEPENDENT_AMBULATORY_CARE_PROVIDER_SITE_OTHER): Payer: Medicare HMO

## 2020-04-24 VITALS — BP 126/86 | HR 81 | Ht 65.5 in | Wt 205.0 lb

## 2020-04-24 DIAGNOSIS — Z86010 Personal history of colonic polyps: Secondary | ICD-10-CM | POA: Diagnosis not present

## 2020-04-24 DIAGNOSIS — K900 Celiac disease: Secondary | ICD-10-CM

## 2020-04-24 DIAGNOSIS — M2550 Pain in unspecified joint: Secondary | ICD-10-CM

## 2020-04-24 LAB — VITAMIN B12: Vitamin B-12: 454 pg/mL (ref 211–911)

## 2020-04-24 LAB — IBC + FERRITIN
Ferritin: 256 ng/mL (ref 10.0–291.0)
Iron: 69 ug/dL (ref 42–145)
Saturation Ratios: 20.4 % (ref 20.0–50.0)
Transferrin: 242 mg/dL (ref 212.0–360.0)

## 2020-04-24 LAB — VITAMIN D 25 HYDROXY (VIT D DEFICIENCY, FRACTURES): VITD: 42.05 ng/mL (ref 30.00–100.00)

## 2020-04-24 NOTE — Patient Instructions (Signed)
If you are age 70 or older, your body mass index should be between 23-30. Your Body mass index is 33.59 kg/m. If this is out of the aforementioned range listed, please consider follow up with your Primary Care Provider.  If you are age 85 or younger, your body mass index should be between 19-25. Your Body mass index is 33.59 kg/m. If this is out of the aformentioned range listed, please consider follow up with your Primary Care Provider.   Please go to the lab in the basement of our building to have lab work done as you leave today. Hit "B" for basement when you get on the elevator.  When the doors open the lab is on your left.  We will call you with the results. Thank you.  Due to recent changes in healthcare laws, you may see the results of your imaging and laboratory studies on MyChart before your provider has had a chance to review them.  We understand that in some cases there may be results that are confusing or concerning to you. Not all laboratory results come back in the same time frame and the provider may be waiting for multiple results in order to interpret others.  Please give [place] 48 hours in order for your provider to thoroughly review all the results before contacting the office for clarification of your results.   You will be due for a recall colonoscopy in 05-2021. We will send you a reminder in the mail when it gets closer to that time.  Please follow up in 1 year.  Thank you for entrusting me with your care and for choosing [facility], Dr. Licking Cellar

## 2020-04-24 NOTE — Progress Notes (Signed)
HPI :  69 year old female here for follow-up for celiac disease and history of colon polyps.  Recall that she was diagnosed with celiac disease about 10 years ago based on serology but never had an endoscopy until 2018 after we evaluated her. Her EGD at that time showed some scalloped mucosa in the duodenum grossly consistent with mild celiac disease. Her biopsies were supportive of it. She has been on a gluten-free diet and generally does pretty well with this. She states she can tell if she ingests gluten, she has joint pains and has loose stools. Generally she has been compliant with a gluten-free diet. She states she also has joint aches and arthralgias if she eats gluten, this has been occurring more frequently although she thinks she has been pretty well compliant with gluten-free diet. She denies any blood in her stools. No abdominal pains. She had a history of hypothyroidism and is medicated for this. She has received Covid vaccine and she has received pneumococcal vaccine. She had a DEXA scan since have last seen her in 2020 which showed no evidence of osteoporosis.  She otherwise had a colonoscopy with me in November 2018, she had 3 small adenomas removed and diverticulosis. Otherwise no significant pathology noted.  She otherwise feels well without complaints today.   Colonoscopy 01/07/2012 - diverticulosis, hemorrhoids, suspected left sided ischemic colitis - unable to evaluate the right colon due to poor prep. Biopsies taken but path not available. Colonoscopy 02/12/2011 - diverticulosis, hemorrhoids - report does not state where diverticular disease is located    EGD 05/27/17  - The exam of the esophagus was otherwise normal. - The entire examined stomach was normal. - Scalloped mucosa was found in the second portion of the duodenum, and mildly in the bulb, consistent with celiac disease. Biopsies for histology were taken with a cold forceps for evaluation of celiac disease. No  other significant pathology noted.   Colonoscopy 05/27/17 - The perianal and digital rectal examinations were normal. - Multiple medium-mouthed diverticula were found in the sigmoid colon. A few noted in the transverse colon. - A 4 mm polyp was found in the transverse colon. The polyp was sessile. The polyp was removed with a cold snare. Resection and retrieval were complete. - Two sessile polyps were found in the sigmoid colon. The polyps were 4 mm in size. These polyps were removed with a cold snare. Resection and retrieval were complete. - Internal hemorrhoids were found during retroflexion. - The exam was otherwise without abnormality. The right colon was normal.   1. Surgical [P], small bowel - INCREASED INTRAEPITHELIAL LYMPHOCYTES AND VILLOUS BLUNTING, SEE COMMENT. - NO DYSPLASIA OR MALIGNANCY. 2. Surgical [P], transverse, sigmoid, polyp (3) - TUBULAR ADENOMA (X3 FRAGMENTS). - NO HIGH GRADE DYSPLASIA OR MALIGNANCY.   DEXA 04/07/19 - T score of 0, normal exam - repeat exam in 2 years      Past Medical History:  Diagnosis Date  . Allergy   . Celiac sprue   . History of radiation therapy 05/26/18- 06/29/18   Right Breast 15 fractions for a total dose of 40.05 Gy. Right Breast boost 5 fractions for a total dose of 10 Gy  . Hypertension   . Hypothyroidism   . Personal history of radiation therapy 2019  . PONV (postoperative nausea and vomiting)      Past Surgical History:  Procedure Laterality Date  . ABDOMINAL HYSTERECTOMY     in 66s. fibroids. including cervix removal. per patient was told no further  pap smears after.   Marland Kitchen arthroscopic knee surgery     2015 under Dr. Theda Sers  . BREAST BIOPSY Right 04/01/2018  . BREAST LUMPECTOMY Right 2019  . BREAST LUMPECTOMY WITH RADIOACTIVE SEED AND SENTINEL LYMPH NODE BIOPSY Right 04/27/2018   Procedure: RIGHT BREAST LUMPECTOMY WITH RADIOACTIVE SEED AND RIGHT AXILLARYSENTINEL LYMPH NODE BIOPSY ERAS PATHWAY;  Surgeon: Rolm Bookbinder, MD;  Location: [place];  Service: General;  Laterality: Right;  PECTORAL BLOCK  . CHOLECYSTECTOMY    . COCCYX REMOVAL     after cheerleading accident  . OOPHORECTOMY     in 43s  . TONSILLECTOMY     Family History  Problem Relation Age of Onset  . Hypertension Mother   . Coronary artery disease Father        37  . COPD Father        passed age 50  . Pulmonary fibrosis Father   . Breast cancer Maternal Aunt   . Pulmonary fibrosis Paternal Aunt   . Stomach cancer Cousin        Paternal  . Colon cancer Neg Hx   . Esophageal cancer Neg Hx   . Pancreatic cancer Neg Hx   . Liver disease Neg Hx    Social History   Tobacco Use  . Smoking status: Former Smoker    Packs/day: 1.00    Years: 39.00    Pack years: 39.00    Types: Cigarettes    Quit date: 07/13/2006    Years since quitting: 13.7  . Smokeless tobacco: Never Used  Vaping Use  . Vaping Use: Never used  Substance Use Topics  . Alcohol use: Yes    Comment: socially  . Drug use: No   Current Outpatient Medications  Medication Sig Dispense Refill  . ALPHA LIPOIC ACID PO Take 1 tablet by mouth daily.    Marland Kitchen anastrozole (ARIMIDEX) 1 MG tablet TAKE 1 TABLET BY MOUTH EVERY DAY 90 tablet 4  . Cholecalciferol (VITAMIN D-3 PO) Take 1,500 Units by mouth daily.    . fish oil-omega-3 fatty acids 1000 MG capsule Take 1 g by mouth daily.      . fluticasone (FLONASE) 50 MCG/ACT nasal spray Place 2 sprays into both nostrils daily. 16 g 6  . gabapentin (NEURONTIN) 100 MG capsule TAKE 1 CAPSULE BY MOUTH 2 (TWO) TIMES DAILY. THEN TAKE 300 MG BEFORE BED (RX FROM DR. MAGRINAT) 180 capsule 0  . gabapentin (NEURONTIN) 300 MG capsule TAKE 1 CAPSULE BY MOUTH EVERY DAY AT BEDTIME 90 capsule 4  . glucosamine-chondroitin 500-400 MG tablet Take 1 tablet by mouth daily.      . hydrOXYzine (ATARAX/VISTARIL) 25 MG tablet TAKE 1 TABLET (25 MG TOTAL) BY MOUTH 3 (THREE) TIMES DAILY AS NEEDED FOR ITCHING. 270 tablet 1  .  levothyroxine (SYNTHROID) 112 MCG tablet Take 1 tablet (112 mcg total) by mouth daily. 90 tablet 3  . lisinopril-hydrochlorothiazide (ZESTORETIC) 20-25 MG tablet TAKE 1 TABLET BY MOUTH EVERY DAY 90 tablet 3  . predniSONE (DELTASONE) 20 MG tablet Take 2 pills for 3 days, 1 pill for 4 days 10 tablet 0  . triamcinolone cream (KENALOG) 0.5 % Apply 1 application topically 2 (two) times daily. 10 days on then off for 7 days 60 g 0  . vitamin B-12 (CYANOCOBALAMIN) 500 MCG tablet Take 500 mcg by mouth daily.     No current facility-administered medications for this visit.   Allergies  Allergen Reactions  . Gluten Meal  Has celic  . Wheat Bran     Has celic   . Cephalexin Hives     Review of Systems: All systems reviewed and negative except where noted in HPI.   Lab Results  Component Value Date   WBC 5.0 04/06/2019   HGB 14.4 04/06/2019   HCT 42.2 04/06/2019   MCV 89.0 04/06/2019   PLT 239 04/06/2019     Lab Results  Component Value Date   CREATININE 0.78 04/06/2019   BUN 10 04/06/2019   NA 141 04/06/2019   K 3.7 04/06/2019   CL 104 04/06/2019   CO2 29 04/06/2019     Physical Exam: BP 126/86   Pulse 81   Ht 5' 5.5" (1.664 m)   Wt 205 lb (93 kg)   LMP  (LMP Unknown)   SpO2 99%   BMI 33.59 kg/m  Constitutional: Pleasant,well-developed, female in no acute distress. Abdominal: Soft, nondistended, nontender.  There are no masses palpable.  Extremities: no edema Lymphadenopathy: No cervical adenopathy noted. Neurological: Alert and oriented to person place and time. Skin: Skin is warm and dry. No rashes noted. Psychiatric: Normal mood and affect. Behavior is normal.   ASSESSMENT AND PLAN: 69 year old female here for reassessment of the following:  Celiac disease / arthralgias - as above, mostly gluten-free diet, occasionally has some dietary indiscretion, which can often lead to arthralgias. Her arthralgias appear worse lately, but unclear if that is related to  active celiac or something else. We discussed options for monitoring her celiac disease, consideration for repeat endoscopy versus checking her tissue transglutaminase level. She wants to avoid endoscopy right now and proceed with checking her TTG level which is reasonable. I will also check a vitamin D, B12, iron studies to make sure normal. Her DEXA scan is up-to-date. Her vaccines are up-to-date. Generally I think she is doing pretty well, she will continue to try to maintain as much of a gluten-free diet as she can.. Will await her labs with further recommendations.  History of colon polyps - 3 small adenomas on her last colonoscopy with a good prep, no high risk lesions. We discussed updated guideline recommendations for repeat colonoscopy 3 to 5 years from her last exam. She is hoping to avoid colonoscopy this year, I will see her again in 1 year for reassessment, consideration for colonoscopy next year or the year after. She was in agreement. No concerning symptoms at this time.  Faywood Cellar, MD [facility]

## 2020-04-25 LAB — TISSUE TRANSGLUTAMINASE, IGA: (tTG) Ab, IgA: 38 U/mL — ABNORMAL HIGH

## 2020-04-29 ENCOUNTER — Other Ambulatory Visit: Payer: Self-pay

## 2020-04-29 DIAGNOSIS — M2550 Pain in unspecified joint: Secondary | ICD-10-CM

## 2020-04-29 DIAGNOSIS — Z86010 Personal history of colonic polyps: Secondary | ICD-10-CM

## 2020-04-29 DIAGNOSIS — K900 Celiac disease: Secondary | ICD-10-CM

## 2020-05-02 NOTE — Patient Instructions (Addendum)
Please stop by lab before you go If you have mychart- we will send your results within 3 business days of [place] receiving them.  If you do not have mychart- we will call you about results within 5 business days of [place] receiving them.  *please note we are currently using [facility] which has a longer processing time than [place] typically so labs may not come back as quickly as in the past *please also note that you will see labs on mychart as soon as they post. I will later go in and write notes on them- will say "notes from Dr. Yong Channel"  blood pressure slightly high- I asked her to do some ome readings for next 2 weeks and update me by mychart. Goal average <140/90. May need to tweak meds if above this but im hoping will come back down  Try rosuvastatin 4m once a week- im hoping very low side effects with infrequent dosing  Please check with your pharmacy to see if they have the shingrix vaccine. If they do- please get this immunization and update [place] phone call or mychart with dates you receive the vaccine

## 2020-05-02 NOTE — Progress Notes (Signed)
Phone [phone]   Subjective:  Patient presents today for their annual physical. Chief complaint-noted.   See problem oriented charting- ROS- full  review of systems was completed and negative except for:  joint pain- she is trying to avoid gluten, trigger finger, occasional twice a month short lived twinges of chest pain  The following were reviewed and entered/updated in epic: Past Medical History:  Diagnosis Date  . Allergy   . Celiac sprue   . History of radiation therapy 05/26/18- 06/29/18   Right Breast 15 fractions for a total dose of 40.05 Gy. Right Breast boost 5 fractions for a total dose of 10 Gy  . Hypertension   . Hypothyroidism   . Personal history of radiation therapy 2019  . PONV (postoperative nausea and vomiting)    Patient Active Problem List   Diagnosis Date Noted  . Malignant neoplasm of upper-outer quadrant of right breast in female, estrogen receptor positive ([place]) 04/22/2018    Priority: High  . Aortic atherosclerosis ([place]) 04/15/2017    Priority: Medium  . Hot flashes 08/15/2014    Priority: Medium  . Obesity 08/15/2014    Priority: Medium  . Hypothyroidism 07/19/2014    Priority: Medium  . CELIAC SPRUE 04/20/2008    Priority: Medium  . Essential hypertension 06/05/2007    Priority: Medium  . Retinal tear 08/15/2014    Priority: Low  . Left knee pain 07/25/2014    Priority: Low  . Allergic rhinitis 07/20/2007    Priority: Low  . Hyperlipidemia 05/06/2020  . History of adenomatous polyp of colon 06/07/2017   Past Surgical History:  Procedure Laterality Date  . ABDOMINAL HYSTERECTOMY     in 40s. fibroids. including cervix removal. per patient was told no further pap smears after.   Marland Kitchen arthroscopic knee surgery     2015 under Dr. Theda Sers  . BREAST BIOPSY Right 04/01/2018  . BREAST LUMPECTOMY Right 2019  . BREAST LUMPECTOMY WITH RADIOACTIVE SEED AND SENTINEL LYMPH NODE BIOPSY Right 04/27/2018   Procedure: RIGHT BREAST LUMPECTOMY WITH  RADIOACTIVE SEED AND RIGHT AXILLARYSENTINEL LYMPH NODE BIOPSY ERAS PATHWAY;  Surgeon: Rolm Bookbinder, MD;  Location: [place];  Service: General;  Laterality: Right;  PECTORAL BLOCK  . CHOLECYSTECTOMY    . COCCYX REMOVAL     after cheerleading accident  . OOPHORECTOMY     in 20s  . TONSILLECTOMY      Family History  Problem Relation Age of Onset  . Hypertension Mother   . Coronary artery disease Father        94  . COPD Father        passed age 34  . Pulmonary fibrosis Father   . Breast cancer Maternal Aunt   . Pulmonary fibrosis Paternal Aunt   . Stomach cancer Cousin        Paternal  . Colon cancer Neg Hx   . Esophageal cancer Neg Hx   . Pancreatic cancer Neg Hx   . Liver disease Neg Hx     Medications- reviewed and updated Current Outpatient Medications  Medication Sig Dispense Refill  . ALPHA LIPOIC ACID PO Take 1 tablet by mouth daily.    Marland Kitchen anastrozole (ARIMIDEX) 1 MG tablet TAKE 1 TABLET BY MOUTH EVERY DAY 90 tablet 4  . Cholecalciferol (VITAMIN D-3 PO) Take 1,500 Units by mouth daily.    . fish oil-omega-3 fatty acids 1000 MG capsule Take 1 g by mouth daily.      . fluticasone (FLONASE) 50  MCG/ACT nasal spray Place 2 sprays into both nostrils daily. 16 g 6  . gabapentin (NEURONTIN) 100 MG capsule TAKE 1 CAPSULE BY MOUTH 2 (TWO) TIMES DAILY. THEN TAKE 300 MG BEFORE BED (RX FROM DR. MAGRINAT) 180 capsule 0  . gabapentin (NEURONTIN) 300 MG capsule TAKE 1 CAPSULE BY MOUTH EVERY DAY AT BEDTIME 90 capsule 4  . glucosamine-chondroitin 500-400 MG tablet Take 1 tablet by mouth daily.      Marland Kitchen levothyroxine (SYNTHROID) 112 MCG tablet Take 1 tablet (112 mcg total) by mouth daily. 90 tablet 3  . lisinopril-hydrochlorothiazide (ZESTORETIC) 20-25 MG tablet TAKE 1 TABLET BY MOUTH EVERY DAY 90 tablet 3  . vitamin B-12 (CYANOCOBALAMIN) 500 MCG tablet Take 500 mcg by mouth daily.    . rosuvastatin (CRESTOR) 10 MG tablet Take 1 tablet (10 mg total) by mouth once a  week. 13 tablet 3   No current facility-administered medications for this visit.    Allergies-reviewed and updated Allergies  Allergen Reactions  . Gluten Meal     Has celic  . Wheat Bran     Has celic   . Cephalexin Hives    Social History   Social History Narrative   Married (husband patient of Dr. Yong Channel), 2 children, 1 grandchild   Her mom lives with her. Dad passed 2017- had lived with them.       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      Hobbies: Traveling, but very busy recent years caring for father who is ill   Objective  Objective:  BP (!) 148/90   Pulse 72   Temp 98.1 F (36.7 C) (Temporal)   Resp 16   Ht 5' 6"  (1.676 m)   Wt 203 lb 6.4 oz (92.3 kg)   LMP  (LMP Unknown)   SpO2 96%   BMI 32.83 kg/m  Gen: NAD, resting comfortably HEENT: Mucous membranes are moist. Oropharynx normal Neck: no thyromegaly CV: RRR no murmurs rubs or gallops Lungs: CTAB no crackles, wheeze, rhonchi Abdomen: soft/nontender/nondistended/normal bowel sounds. No rebound or guarding.  Ext: no edema Skin: warm, dry Neuro: grossly normal, moves all extremities, PERRLA   Assessment and Plan   69 y.o. female presenting for annual physical.  Health Maintenance counseling: 1. Anticipatory guidance: Patient counseled regarding regular dental exams -q6 months, eye exams -  Yearly with history torn retina  avoiding smoking and second hand smoke , limiting alcohol to 1 beverage per day- once a month .   2. Risk factor reduction:  Advised patient of need for regular exercise and diet rich and fruits and vegetables to reduce risk of heart attack and stroke. Exercise- last year walking with husband 30 minutes most days of the week - has dropped off this year- encouraged restart. Diet-  Weight up 2 lbs from last year. Feels eats a healthy diet so this is discouraging eggs and gluten free toast, lettuce wrap for lunch, sparing indiscretions Wt Readings from Last 3  Encounters:  05/06/20 203 lb 6.4 oz (92.3 kg)  04/24/20 205 lb (93 kg)  01/16/20 203 lb (92.1 kg)  3. Immunizations/screenings/ancillary studies thank patient for doing Covid booster and flu shot. Discussed Shingrix at pharmacy  Immunization History  Administered Date(s) Administered  . Fluad Quad(high Dose 65+) 05/04/2019  . Influenza, High Dose Seasonal PF 05/21/2016, 04/19/2018  . PFIZER SARS-COV-2 Vaccination 08/03/2019, 08/22/2019, 05/03/2020  . Pneumococcal Conjugate-13 04/14/2016  . Pneumococcal Polysaccharide-23 04/15/2017  . Td 11/07/2008  . Tdap 11/24/2013  .  Zoster 06/02/2013   4. Cervical cancer screening- hysterectomy in the past age based screening recommendations 5. Breast cancer follow-up-patient with ongoing treatment plan of Arimidex after prior treatments. Continues to get regular mammograms. 6. Colon cancer screening - November 2018 with 3-5 year follow-up (on his last note 04/24/20) planned due to polyp history. Also has ulcerative colitis per college records- encouraged her to make sure Dr. Havery Moros is aware.  7. Skin cancer screening-no dermatologist. advised regular sunscreen use. Denies worrisome, changing, or new skin lesions.  8. Birth control/STD check- hysterectomy and monogamous 9. Osteoporosis screening at 62- normal with Dr. Jana Hakim in 2020 -Former smoker-quit 2008. Will check UA with labs. 39 pack years and discussed lung cancer screening program but would not qualify with ongoing breast cancer treatment  Status of chronic or acute concerns   #Celiac disease-continues to remain gluten-free as best as possible. Significant joint pain if accidentally has some  # Ulcerative   #hypothyroidism S: compliant On thyroid medication-Synthroid 112Mcg down from 25 mcg last year Lab Results  Component Value Date   TSH 0.25 (L) 05/04/2019   A/P: Overdue for repeat-update TSH with labs. Has noted a fair amount of fatigue with lower dose.   #hypertension S:  medication: Zestoretic 20-25Mg Home readings #s: has cuff BP Readings from Last 3 Encounters:  05/06/20 (!) 148/90  04/24/20 126/86  01/12/20 (!) 142/78  A/P: blood pressure slightly high- I asked her to do some ome readings for next 2 weeks and update me by mychart. Goal average <140/90. May need to tweak meds if above this but im hoping will come back down  #hyperlipidemia/aortic atherosclerosis S: Medication:None Lab Results  Component Value Date   CHOL 172 05/04/2019   HDL 50.70 05/04/2019   LDLCALC 100 (H) 05/04/2019   TRIG 107.0 05/04/2019   CHOLHDL 3 05/04/2019   A/P: 10-year ASCVD risk elevated at 14.5%-update lipid panel again today. We discussed options and she prefers to hold off on statin due to potential side effects -ideally would consider statin due to atherosclerosis on aorta and family history- after discussion opted to try rosuvastatin 50m once a week   #Hot flashes-reasonable relief with gabapentin in the daytime at 100 mg and 3 mg at bedtime  #Mild ALT elevation-could be fatty liver related-work on weight loss and update LFTs today  Hepatic Function Latest Ref Rng & Units 04/06/2019 11/24/2018 04/15/2017  Total Protein 6.5 - 8.1 g/dL 6.9 6.3 7.3  Albumin 3.5 - 5.0 g/dL 4.1 4.1 4.6  AST 15 - 41 U/L 32 26 32  ALT 0 - 44 U/L 48(H) 33 55(H)  Alk Phosphatase 38 - 126 U/L 75 58 60  Total Bilirubin 0.3 - 1.2 mg/dL 0.5 0.6 0.8  Bilirubin, Direct 0.0 - 0.3 mg/dL - - -     # chest pain- intermittent left sided pain usually when working with husband. Perhaps twice a month for a few seconds. No shortness of breath or left arm or neck pain. Does not happen with exertion. This could easily be anxiety. If she has new or worsening symptoms will let me know. Does not improve with rest.   Recommended follow up: Return in about 6 months (around 11/04/2020) for follow up- or sooner if needed. Future Appointments  Date Time Provider [place] 05/06/2020  1:00 PM [facility]  LAB [facility] None  05/06/2020  1:30 PM Magrinat, GVirgie Dad MD [facility] None   Lab/Order associations: fasting   ICD-10-CM   1. Preventative health care  Z00.00 Lipid panel    TSH    CBC With Differential/Platelet    COMPLETE METABOLIC PANEL WITH GFR  2. Essential hypertension  I10 POCT Urinalysis Dipstick (Automated)  3. Hypothyroidism due to acquired atrophy of thyroid  E03.4 TSH  4. Hyperlipidemia, unspecified hyperlipidemia type  E78.5 Lipid panel    CBC With Differential/Platelet    COMPLETE METABOLIC PANEL WITH GFR   Meds ordered this encounter  Medications  . rosuvastatin (CRESTOR) 10 MG tablet    Sig: Take 1 tablet (10 mg total) by mouth once a week.    Dispense:  13 tablet    Refill:  3    Return precautions advised.  Garret Reddish, MD

## 2020-05-05 NOTE — Progress Notes (Signed)
[facility]  Telephone:([phone]) [phone] Fax:(336) [phone]    ID: Christine Leon DOB: Jun 27, 1951  MR#: [id number]  SEG#:[id number]  Patient Care Team: Marin Olp, MD as PCP - General (Family Medicine) Rolm Bookbinder, MD as Consulting Physician (General Surgery) Zannie Locastro, Virgie Dad, MD as Consulting Physician (Oncology) Eppie Gibson, MD as Attending Physician (Radiation Oncology) Armbruster, Carlota Raspberry, MD as Consulting Physician (Gastroenterology) Madelin Rear, [facility] as Pharmacist (Pharmacist) OTHER MD:   CHIEF COMPLAINT: Estrogen receptor positive breast cancer  CURRENT TREATMENT: anastrozole   INTERVAL HISTORY: Christine Leon returns today for follow-up of her estrogen receptor positive breast cancer.   She continues on anastrozole.  She is tolerating it much better since she started the gabapentin.  She took it initially at bedtime, and has been able to sleep right through the night on that basis.  She then discussed it with Dr. Yong Channel who added gabapentin during the day 800 mg and that has taken care of the daytime hot flashes.  It does not make her drowsy or sleepy.  Her most recent bone density screening from 04/07/2019 showed a T-score of 0.0, which is considered normal.  Since her last visit, she presented for her routine mammogram with concerns of a recent palpable lump near the right lumpectomy bed. She underwent bilateral diagnostic mammography with tomography and right breast ultrasonography at [facility] on 04/08/2020 showing: breast density category B; no evidence of malignancy in either breast.   REVIEW OF SYSTEMS: Christine Leon has had both Covid vaccines plus the booster and tolerated them well.  She continues to be primary caregiver for her husband who she had a couple of strokes and has some word finding but otherwise participates very well with the family.  She herself does gardening for exercise.  She has a gluten allergy which can exacerbate her  arthritis problems that she does not watch out.  Otherwise a detailed review of systems today was stable   HISTORY OF CURRENT ILLNESS: From the original intake note:  Christine Leon "Christine Leon" had previous mammography in 2016 showing distortion of the right breast with benign findings along with a 58-monthfollow up. She did not have further mammography until she had bilateral diagnostic mammography with tomography and [facility] showing: breast density category B. There was a suspicious mass in the right breast upper outer quadrant at 12 o'clock measuring 0.5 x 0.4 x 0.4 cm. There were multiple adjacent cysts. There was no sonographic evidence of abnormal lymph nodes or adenopathy.   Accordingly on 03/30/2018 she proceeded to biopsy of the right breast area in question. The pathology from this procedure showed (([id number]: Invasive ductal carcinoma, grade I. Prognostic indicators significant for: estrogen receptor, 100% positive and progesterone receptor, 80% positive, both with string staining intensity. Proliferation marker Ki67 at 3%. HER2 negative with an immunohistochemistry of (1+).  The patient's subsequent history is as detailed below.   PAST MEDICAL HISTORY: Past Medical History:  Diagnosis Date  . Allergy   . Celiac sprue   . History of radiation therapy 05/26/18- 06/29/18   Right Breast 15 fractions for a total dose of 40.05 Gy. Right Breast boost 5 fractions for a total dose of 10 Gy  . Hypertension   . Hypothyroidism   . Personal history of radiation therapy 2019  . PONV (postoperative nausea and vomiting)     PAST SURGICAL HISTORY: Past Surgical History:  Procedure Laterality Date  . ABDOMINAL HYSTERECTOMY  in 74s. fibroids. including cervix removal. per patient was told no further pap smears after.   Marland Kitchen arthroscopic knee surgery     2015 under Dr. Theda Sers  . BREAST BIOPSY Right 04/01/2018  . BREAST LUMPECTOMY  Right 2019  . BREAST LUMPECTOMY WITH RADIOACTIVE SEED AND SENTINEL LYMPH NODE BIOPSY Right 04/27/2018   Procedure: RIGHT BREAST LUMPECTOMY WITH RADIOACTIVE SEED AND RIGHT AXILLARYSENTINEL LYMPH NODE BIOPSY ERAS PATHWAY;  Surgeon: Rolm Bookbinder, MD;  Location: [facility];  Service: General;  Laterality: Right;  PECTORAL BLOCK  . CHOLECYSTECTOMY    . COCCYX REMOVAL     after cheerleading accident  . OOPHORECTOMY     in 40s  . TONSILLECTOMY    Hysterectomy with BSO (fibroids)   FAMILY HISTORY Family History  Problem Relation Age of Onset  . Hypertension Mother   . Coronary artery disease Father        35  . COPD Father        passed age 72  . Pulmonary fibrosis Father   . Breast cancer Maternal Aunt   . Pulmonary fibrosis Paternal Aunt   . Stomach cancer Cousin        Paternal  . Colon cancer Neg Hx   . Esophageal cancer Neg Hx   . Pancreatic cancer Neg Hx   . Liver disease Neg Hx    The patient's father died at age 63 due to COPD and heart valve issues. The patient's mother is alive at age 77. The patient had 1 brother (deceased) and no sisters. There was a maternal aunt who was diagnosed with breast cancer in the 37's. The patient' denies any other family members with breast, ovarian, or other cancers.  The patient does not meet criteria for genetics testing   GYNECOLOGIC HISTORY:  No LMP recorded (lmp unknown). Patient has had a hysterectomy. Menarche: [age] years old Age at first live birth: 69 years old  She is GX P2. She is status post total hysterectomy with BSO in 1995. Her LMP was in her late 71's. She took HRT for a few years and recently discontinued.     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 Her husband, Christine Leon, is retired from working in Scientist, physiological for [employer]. [name] recently suffered from a medical incident and Christine Leon has been caring for him. The patient's oldest, a son named Christine Leon,  lives in [place] and works as a [employer]th Land. The patient's youngest, a daughter named Christine Leon, lives in [place] and works as a Print production planner. The patient has 4 grandchildren. The patient is not a church attender   ADVANCED DIRECTIVES: Her husband recently suffered a medical incident, so she plans on naming her son, Christine Leon, as Medical Power of Attorney.     HEALTH MAINTENANCE: Social History   Tobacco Use  . Smoking status: Former Smoker    Packs/day: 1.00    Years: 39.00    Pack years: 39.00    Types: Cigarettes    Quit date: 07/13/2006    Years since quitting: 13.8  . Smokeless tobacco: Never Used  Vaping Use  . Vaping Use: Never used  Substance Use Topics  . Alcohol use: Yes    Comment: socially  . Drug use: No    Colonoscopy: 2018/ Dr. Havery Moros (repeat in 2 years)  PAP:  Bone density: scheduled for 2019   Allergies  Allergen Reactions  . Gluten Meal     Has celic  .  Wheat Bran     Has celic   . Cephalexin Hives    Current Outpatient Medications  Medication Sig Dispense Refill  . ALPHA LIPOIC ACID PO Take 1 tablet by mouth daily.    Marland Kitchen anastrozole (ARIMIDEX) 1 MG tablet TAKE 1 TABLET BY MOUTH EVERY DAY 90 tablet 4  . Cholecalciferol (VITAMIN D-3 PO) Take 1,500 Units by mouth daily.    . fish oil-omega-3 fatty acids 1000 MG capsule Take 1 g by mouth daily.      . fluticasone (FLONASE) 50 MCG/ACT nasal spray Place 2 sprays into both nostrils daily. 16 g 6  . gabapentin (NEURONTIN) 100 MG capsule TAKE 1 CAPSULE BY MOUTH 2 (TWO) TIMES DAILY. THEN TAKE 300 MG BEFORE BED (RX FROM DR. Ladora Osterberg) 180 capsule 0  . gabapentin (NEURONTIN) 300 MG capsule TAKE 1 CAPSULE BY MOUTH EVERY DAY AT BEDTIME 90 capsule 4  . glucosamine-chondroitin 500-400 MG tablet Take 1 tablet by mouth daily.      Marland Kitchen levothyroxine (SYNTHROID) 112 MCG tablet Take 1 tablet (112 mcg total) by mouth daily. 90 tablet 3  . lisinopril-hydrochlorothiazide (ZESTORETIC) 20-25 MG tablet TAKE 1  TABLET BY MOUTH EVERY DAY 90 tablet 3  . rosuvastatin (CRESTOR) 10 MG tablet Take 1 tablet (10 mg total) by mouth once a week. 13 tablet 3  . vitamin B-12 (CYANOCOBALAMIN) 500 MCG tablet Take 500 mcg by mouth daily.     No current facility-administered medications for this visit.    OBJECTIVE: White woman in no acute distress  Vitals:   05/06/20 1322  BP: (!) 143/69  Pulse: 66  Resp: 18  Temp: 97.7 F (36.5 C)  SpO2: 97%     Body mass index is 32.99 kg/m.   Wt Readings from Last 3 Encounters:  05/06/20 204 lb 6.4 oz (92.7 kg)  05/06/20 203 lb 6.4 oz (92.3 kg)  04/24/20 205 lb (93 kg)      ECOG FS:1 - Symptomatic but completely ambulatory  Sclerae unicteric, EOMs intact Wearing a mask No cervical or supraclavicular adenopathy Lungs no rales or rhonchi Heart regular rate and rhythm Abd soft, obese, nontender, positive bowel sounds MSK no focal spinal tenderness, no upper extremity lymphedema Neuro: nonfocal, well oriented, appropriate affect Breasts: The right breast has undergone lumpectomy followed by radiation.  There is no evidence of disease recurrence.  The left breast is benign.  Both axillae are benign.   LAB RESULTS:  CMP     Component Value Date/Time   NA 141 04/06/2019 1258   K 3.7 04/06/2019 1258   CL 104 04/06/2019 1258   CO2 29 04/06/2019 1258   GLUCOSE 93 04/06/2019 1258   BUN 10 04/06/2019 1258   CREATININE 0.78 04/06/2019 1258   CALCIUM 9.4 04/06/2019 1258   PROT 6.9 04/06/2019 1258   ALBUMIN 4.1 04/06/2019 1258   AST 32 04/06/2019 1258   ALT 48 (H) 04/06/2019 1258   ALKPHOS 75 04/06/2019 1258   BILITOT 0.5 04/06/2019 1258   GFRNONAA >60 04/06/2019 1258   GFRAA >60 04/06/2019 1258    Lab Results  Component Value Date   WBC 5.0 04/06/2019   NEUTROABS 2.6 04/06/2019   HGB 14.4 04/06/2019   HCT 42.2 04/06/2019   MCV 89.0 04/06/2019   PLT 239 04/06/2019   No results found for: LABCA2  No components found for: ASNKNL976  No results  for input(s): INR in the last 168 hours.  No results found for: LABCA2  No results found for: BHA193  No results found for: HYQ657  No results found for: QIO962  No results found for: CA2729  No components found for: HGQUANT  No results found for: CEA1 / No results found for: CEA1   No results found for: AFPTUMOR  No results found for: CHROMOGRNA  No results found for: TOTALPROTELP, ALBUMINELP, A1GS, A2GS, BETS, BETA2SER, GAMS, MSPIKE, SPEI (this displays SPEP labs)  No results found for: KPAFRELGTCHN, LAMBDASER, KAPLAMBRATIO (kappa/lambda light chains)  No results found for: HGBA, HGBA2QUANT, HGBFQUANT, HGBSQUAN (Hemoglobinopathy evaluation)   No results found for: LDH  Lab Results  Component Value Date   IRON 69 04/24/2020   IRONPCTSAT 20.4 04/24/2020   (Iron and TIBC)  Lab Results  Component Value Date   FERRITIN 256.0 04/24/2020    Urinalysis    Component Value Date/Time   COLORURINE YELLOW 12/27/2016 0626   APPEARANCEUR CLEAR 12/27/2016 0626   LABSPEC 1.006 12/27/2016 0626   PHURINE 7.0 12/27/2016 0626   GLUCOSEU NEGATIVE 12/27/2016 0626   HGBUR NEGATIVE 12/27/2016 0626   BILIRUBINUR Negative 04/15/2017 1047   KETONESUR NEGATIVE 12/27/2016 0626   PROTEINUR Negative 04/15/2017 1047   PROTEINUR NEGATIVE 12/27/2016 0626   UROBILINOGEN 0.2 04/15/2017 1047   NITRITE Negative 04/15/2017 1047   NITRITE NEGATIVE 12/27/2016 0626   LEUKOCYTESUR Moderate (2+) (A) 04/15/2017 1047    STUDIES: US BREAST LTD UNI RIGHT INC AXILLA  Result Date: 04/08/2020 CLINICAL DATA:  68 year old female status post malignant right lumpectomy in 2019. The patient states she had a palpable lump near the right lumpectomy bed, which she cannot find today. EXAM: DIGITAL DIAGNOSTIC BILATERAL MAMMOGRAM WITH CAD AND TOMO ULTRASOUND RIGHT BREAST COMPARISON:  Previous exam(s). ACR Breast Density Category b: There are scattered areas of fibroglandular density. FINDINGS: Stable  postsurgical changes are noted in the superior central right breast. This is seen in association with a radiopaque BB. No new or suspicious findings are identified in the remainder of either breast. Mammographic images were processed with CAD. On physical exam, I palpate no suspicious lumps in the upper right breast. Targeted ultrasound is performed, showing a 1.4 x 1.5 x 1.4 cm anechoic mass at the 12:30 position 6 cm from the nipple. This is consistent with an area of fat necrosis seen mammographically. No additional suspicious findings are identified in the right breast. IMPRESSION: 1. No mammographic evidence of malignancy in either breast. 2. No additional suspicious sonographic findings at the site of the patient's questioned right breast palpable lump. RECOMMENDATION: 1. Clinical follow-up recommended for the palpable area of concern in the right breast. Any further workup should be based on clinical grounds. 2.  Diagnostic mammogram is suggested in 1 year. (Code:DM-B-01Y) I have discussed the findings and recommendations with the patient. If applicable, a reminder letter will be sent to the patient regarding the next appointment. BI-RADS CATEGORY  2: Benign. Electronically Signed   By: Kristopher Oppenheim M.D.   On: 04/08/2020 15:36   MM DIAG BREAST TOMO BILATERAL  Result Date: 04/08/2020 CLINICAL DATA:  69 year old female status post malignant right lumpectomy in 2019. The patient states she had a palpable lump near the right lumpectomy bed, which she cannot find today. EXAM: DIGITAL DIAGNOSTIC BILATERAL MAMMOGRAM WITH CAD AND TOMO ULTRASOUND RIGHT BREAST COMPARISON:  Previous exam(s). ACR Breast Density Category b: There are scattered areas of fibroglandular density. FINDINGS: Stable postsurgical changes are noted in the superior central right breast. This is seen in association with a radiopaque BB. No new or suspicious findings are identified in the  remainder of either breast. Mammographic images were  processed with CAD. On physical exam, I palpate no suspicious lumps in the upper right breast. Targeted ultrasound is performed, showing a 1.4 x 1.5 x 1.4 cm anechoic mass at the 12:30 position 6 cm from the nipple. This is consistent with an area of fat necrosis seen mammographically. No additional suspicious findings are identified in the right breast. IMPRESSION: 1. No mammographic evidence of malignancy in either breast. 2. No additional suspicious sonographic findings at the site of the patient's questioned right breast palpable lump. RECOMMENDATION: 1. Clinical follow-up recommended for the palpable area of concern in the right breast. Any further workup should be based on clinical grounds. 2.  Diagnostic mammogram is suggested in 1 year. (Code:DM-B-01Y) I have discussed the findings and recommendations with the patient. If applicable, a reminder letter will be sent to the patient regarding the next appointment. BI-RADS CATEGORY  2: Benign. Electronically Signed   By: Kristopher Oppenheim M.D.   On: 04/08/2020 15:36     ELIGIBLE FOR AVAILABLE RESEARCH PROTOCOL: no   ASSESSMENT: 69 y.o. Christine Leon, [place] woman status post right breast upper outer quadrant biopsy 03/30/2018 for a clinical T1a N0, stage IA invasive ductal carcinoma, grade 1, estrogen and progesterone receptor positive, HER-2 not amplified, with an MIB-1 of 3%.  (1) status post right lumpectomy and sentinel lymph node sampling 04/27/2018 for a pT1c pN0, stage IA invasive ductal carcinoma, grade 1, with negative margins  (a) 1 sentinel lymph node was removed  (2) Oncotype DX score of 17 predicts a risk of recurrence outside the breast over 9 years of 5% if the patient's only systemic therapy is an antiestrogen for 5 years.  It also predicts no benefit from chemotherapy  (3) adjuvant radiation completed 06/28/2018  (4) started tamoxifen 07/27/2017--discontinued secondary to rash  (5) anastrozole started 08/31/2018   PLAN: Christine Leon is now  2 years out from definitive surgery for breast cancer with no evidence of disease recurrence.  This is very favorable.  She is tolerating anastrozole well and the plan will be to continue that a total of 5 years.  We discussed exercise and diet issues.  I am delighted that her husband is doing better.  She has very good family life and greatly enjoys making music  She will see me again in a year.  She knows to call for any other issue that may develop before then.  Graf total encounter time 25 minutes.   Jessicaann Overbaugh, Virgie Dad, MD  05/06/20 1:43 PM Medical Oncology and Hematology [facility] [address] [place], [place] [address] Tel. [phone]    Fax. [phone]   I, Wilburn Mylar, am acting as scribe for Dr. Virgie Dad. Lama Narayanan.  I, Lurline Del MD, have reviewed the above documentation for accuracy and completeness, and I agree with the above.   *Total Encounter Time as defined by the Centers for Medicare and Medicaid Services includes, in addition to the face-to-face time of a patient visit (documented in the note above) non-face-to-face time: obtaining and reviewing outside history, ordering and reviewing medications, tests or procedures, care coordination (communications with other health care professionals or caregivers) and documentation in the medical record.

## 2020-05-06 ENCOUNTER — Other Ambulatory Visit: Payer: Self-pay | Admitting: Family Medicine

## 2020-05-06 ENCOUNTER — Encounter: Payer: Self-pay | Admitting: Family Medicine

## 2020-05-06 ENCOUNTER — Ambulatory Visit (INDEPENDENT_AMBULATORY_CARE_PROVIDER_SITE_OTHER): Payer: Medicare HMO | Admitting: Family Medicine

## 2020-05-06 ENCOUNTER — Inpatient Hospital Stay: Payer: Medicare HMO | Attending: Oncology | Admitting: Oncology

## 2020-05-06 ENCOUNTER — Inpatient Hospital Stay: Payer: Medicare HMO

## 2020-05-06 ENCOUNTER — Other Ambulatory Visit: Payer: Self-pay

## 2020-05-06 VITALS — BP 143/69 | HR 66 | Temp 97.7°F | Resp 18 | Ht 66.0 in | Wt 204.4 lb

## 2020-05-06 VITALS — BP 148/90 | HR 72 | Temp 98.1°F | Resp 16 | Ht 66.0 in | Wt 203.4 lb

## 2020-05-06 DIAGNOSIS — Z800 Family history of malignant neoplasm of digestive organs: Secondary | ICD-10-CM | POA: Insufficient documentation

## 2020-05-06 DIAGNOSIS — C50411 Malignant neoplasm of upper-outer quadrant of right female breast: Secondary | ICD-10-CM | POA: Insufficient documentation

## 2020-05-06 DIAGNOSIS — Z79811 Long term (current) use of aromatase inhibitors: Secondary | ICD-10-CM | POA: Insufficient documentation

## 2020-05-06 DIAGNOSIS — Z803 Family history of malignant neoplasm of breast: Secondary | ICD-10-CM | POA: Diagnosis not present

## 2020-05-06 DIAGNOSIS — Z170 Estrogen receptor positive status [ER+]: Secondary | ICD-10-CM

## 2020-05-06 DIAGNOSIS — Z0000 Encounter for general adult medical examination without abnormal findings: Secondary | ICD-10-CM

## 2020-05-06 DIAGNOSIS — Z923 Personal history of irradiation: Secondary | ICD-10-CM | POA: Diagnosis not present

## 2020-05-06 DIAGNOSIS — I10 Essential (primary) hypertension: Secondary | ICD-10-CM

## 2020-05-06 DIAGNOSIS — E785 Hyperlipidemia, unspecified: Secondary | ICD-10-CM

## 2020-05-06 DIAGNOSIS — Z90722 Acquired absence of ovaries, bilateral: Secondary | ICD-10-CM | POA: Diagnosis not present

## 2020-05-06 DIAGNOSIS — Z87891 Personal history of nicotine dependence: Secondary | ICD-10-CM | POA: Diagnosis not present

## 2020-05-06 DIAGNOSIS — Z79899 Other long term (current) drug therapy: Secondary | ICD-10-CM | POA: Insufficient documentation

## 2020-05-06 DIAGNOSIS — E034 Atrophy of thyroid (acquired): Secondary | ICD-10-CM

## 2020-05-06 DIAGNOSIS — Z9079 Acquired absence of other genital organ(s): Secondary | ICD-10-CM | POA: Insufficient documentation

## 2020-05-06 DIAGNOSIS — Z90710 Acquired absence of both cervix and uterus: Secondary | ICD-10-CM | POA: Diagnosis not present

## 2020-05-06 MED ORDER — GABAPENTIN 100 MG PO CAPS: ORAL_CAPSULE | ORAL | 6 refills | Status: DC

## 2020-05-06 MED ORDER — ROSUVASTATIN CALCIUM 10 MG PO TABS: 10.0000 mg | ORAL_TABLET | ORAL | 3 refills | Status: DC

## 2020-05-06 MED ORDER — ANASTROZOLE 1 MG PO TABS
1.0000 mg | ORAL_TABLET | Freq: Every day | ORAL | 4 refills | Status: DC
Start: 2020-05-06 — End: 2021-05-06

## 2020-05-06 MED ORDER — GABAPENTIN 300 MG PO CAPS
ORAL_CAPSULE | ORAL | 4 refills | Status: DC
Start: 2020-05-06 — End: 2021-05-06

## 2020-05-06 NOTE — Assessment & Plan Note (Signed)
#  hyperlipidemia/aortic atherosclerosis S: Medication:None Lab Results  Component Value Date   CHOL 172 05/04/2019   HDL 50.70 05/04/2019   LDLCALC 100 (H) 05/04/2019   TRIG 107.0 05/04/2019   CHOLHDL 3 05/04/2019   A/P: 10-year ASCVD risk elevated at 14.5%-update lipid panel again today. We discussed options and she prefers to hold off on statin due to potential side effects -ideally would consider statin due to atherosclerosis on aorta and family history- after discussion opted to try rosuvastatin 40m once a week

## 2020-05-07 LAB — CBC WITH DIFFERENTIAL/PLATELET
Absolute Monocytes: 310 cells/uL (ref 200–950)
Basophils Absolute: 30 cells/uL (ref 0–200)
Basophils Relative: 0.7 %
Eosinophils Absolute: 69 cells/uL (ref 15–500)
Eosinophils Relative: 1.6 %
HCT: 44.4 % (ref 35.0–45.0)
Hemoglobin: 14.8 g/dL (ref 11.7–15.5)
Lymphs Abs: 1849 cells/uL (ref 850–3900)
MCH: 30.2 pg (ref 27.0–33.0)
MCHC: 33.3 g/dL (ref 32.0–36.0)
MCV: 90.6 fL (ref 80.0–100.0)
MPV: 10 fL (ref 7.5–12.5)
Monocytes Relative: 7.2 %
Neutro Abs: 2043 cells/uL (ref 1500–7800)
Neutrophils Relative %: 47.5 %
Platelets: 247 10*3/uL (ref 140–400)
RBC: 4.9 10*6/uL (ref 3.80–5.10)
RDW: 13.2 % (ref 11.0–15.0)
Total Lymphocyte: 43 %
WBC: 4.3 10*3/uL (ref 3.8–10.8)

## 2020-05-07 LAB — COMPLETE METABOLIC PANEL WITH GFR
AG Ratio: 1.9 (calc) (ref 1.0–2.5)
ALT: 56 U/L — ABNORMAL HIGH (ref 6–29)
AST: 50 U/L — ABNORMAL HIGH (ref 10–35)
Albumin: 4.4 g/dL (ref 3.6–5.1)
Alkaline phosphatase (APISO): 72 U/L (ref 37–153)
BUN: 12 mg/dL (ref 7–25)
CO2: 28 mmol/L (ref 20–32)
Calcium: 9.9 mg/dL (ref 8.6–10.4)
Chloride: 104 mmol/L (ref 98–110)
Creat: 0.71 mg/dL (ref 0.50–0.99)
GFR, Est African American: 101 mL/min/{1.73_m2} (ref >=60)
GFR, Est Non African American: 87 mL/min/{1.73_m2} (ref >=60)
Globulin: 2.3 g/dL (calc) (ref 1.9–3.7)
Glucose, Bld: 92 mg/dL (ref 65–99)
Potassium: 3.6 mmol/L (ref 3.5–5.3)
Sodium: 142 mmol/L (ref 135–146)
Total Bilirubin: 0.7 mg/dL (ref 0.2–1.2)
Total Protein: 6.7 g/dL (ref 6.1–8.1)

## 2020-05-07 LAB — LIPID PANEL
Cholesterol: 175 mg/dL (ref <200)
HDL: 46 mg/dL — ABNORMAL LOW (ref >=50)
LDL Cholesterol (Calc): 103 mg/dL (calc) — ABNORMAL HIGH
Non-HDL Cholesterol (Calc): 129 mg/dL (calc) (ref <130)
Total CHOL/HDL Ratio: 3.8 (calc) (ref <5.0)
Triglycerides: 160 mg/dL — ABNORMAL HIGH (ref <150)

## 2020-05-07 LAB — TSH: TSH: 1.44 mIU/L (ref 0.40–4.50)

## 2020-05-08 ENCOUNTER — Encounter: Payer: Self-pay | Admitting: Family Medicine

## 2020-05-22 DIAGNOSIS — Z0100 Encounter for examination of eyes and vision without abnormal findings: Secondary | ICD-10-CM | POA: Diagnosis not present

## 2020-07-01 ENCOUNTER — Telehealth: Payer: Self-pay

## 2020-07-01 NOTE — Telephone Encounter (Signed)
Lm on vm for patient to return call 

## 2020-07-01 NOTE — Telephone Encounter (Signed)
-----   Message from Yevette Edwards, RN sent at 04/29/2020  9:40 AM EDT ----- Regarding: Labs TTG/IGA, order in epic

## 2020-07-02 NOTE — Telephone Encounter (Signed)
Lm on vm for patient to return call.

## 2020-07-03 NOTE — Telephone Encounter (Signed)
MyChart message sent to patient.

## 2020-08-05 ENCOUNTER — Telehealth: Payer: Self-pay

## 2020-08-05 NOTE — Progress Notes (Signed)
Patient would not like to reschedule Initial visit with Rph at this time .  Georgiana Shore ,[facility] Pharmacist Assistant [phone]

## 2020-08-06 IMAGING — MG MM PLC BREAST LOC DEV 1ST LESION INC*R*
6 series · 6 of 6 positions shown · non-contrast
Comparison: Previous exam(s).

CLINICAL DATA: Preoperative radioactive seed localization prior to
right breast lumpectomy.

EXAM:
MAMMOGRAPHIC GUIDED RADIOACTIVE SEED LOCALIZATION OF THE RIGHT
BREAST

[R ML (1 of 3)]
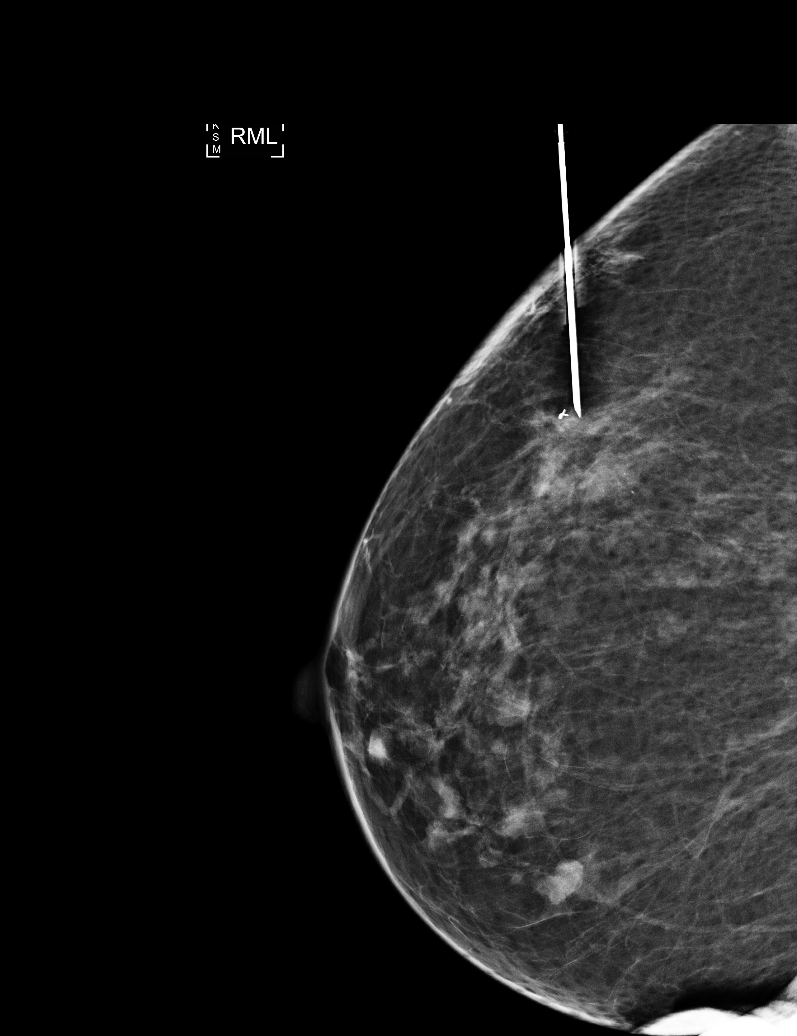

[R ML (2 of 3)]
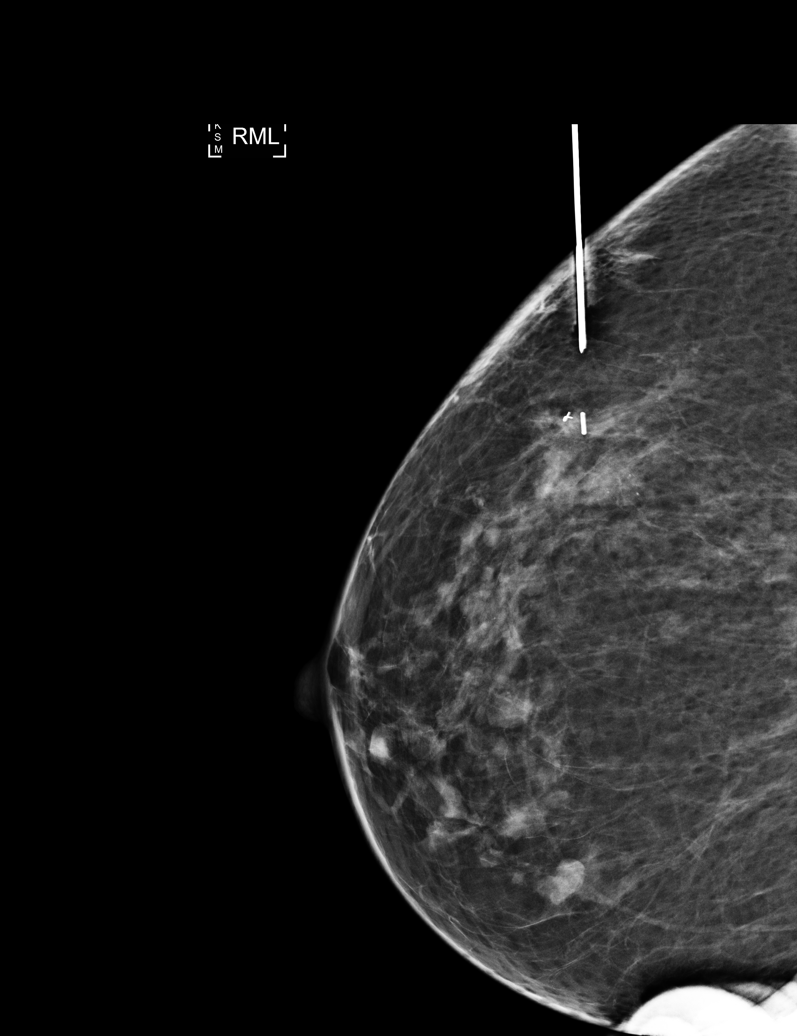

[R CC (1 of 3)]
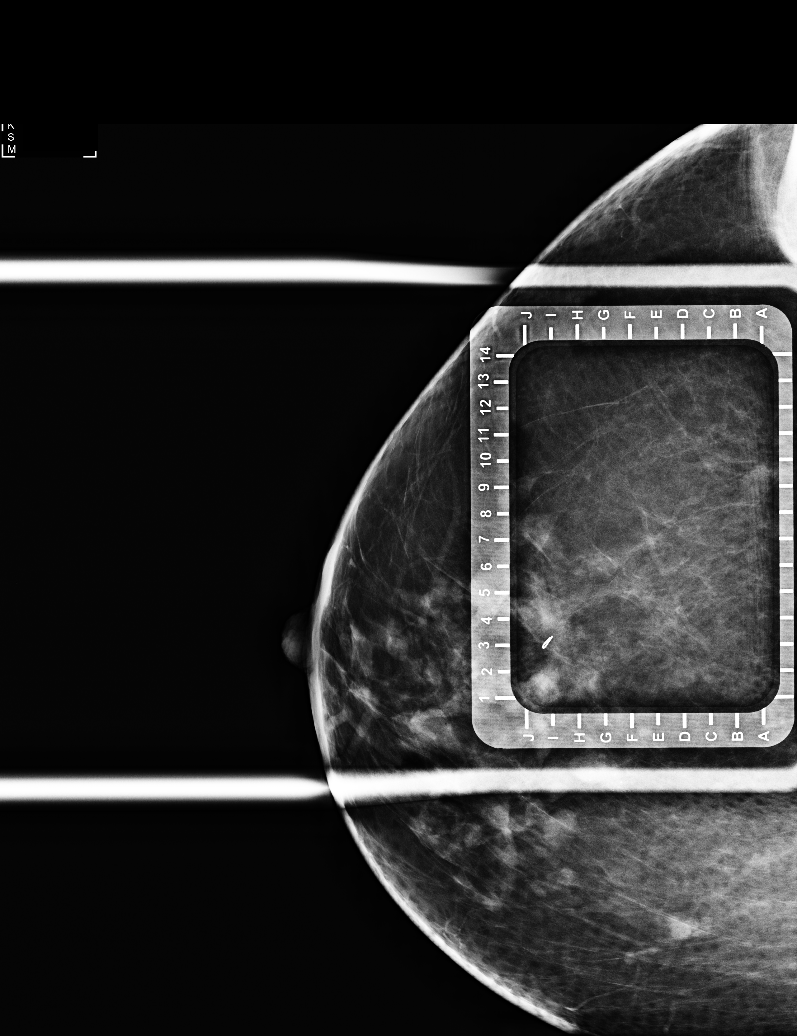

[R CC (2 of 3)]
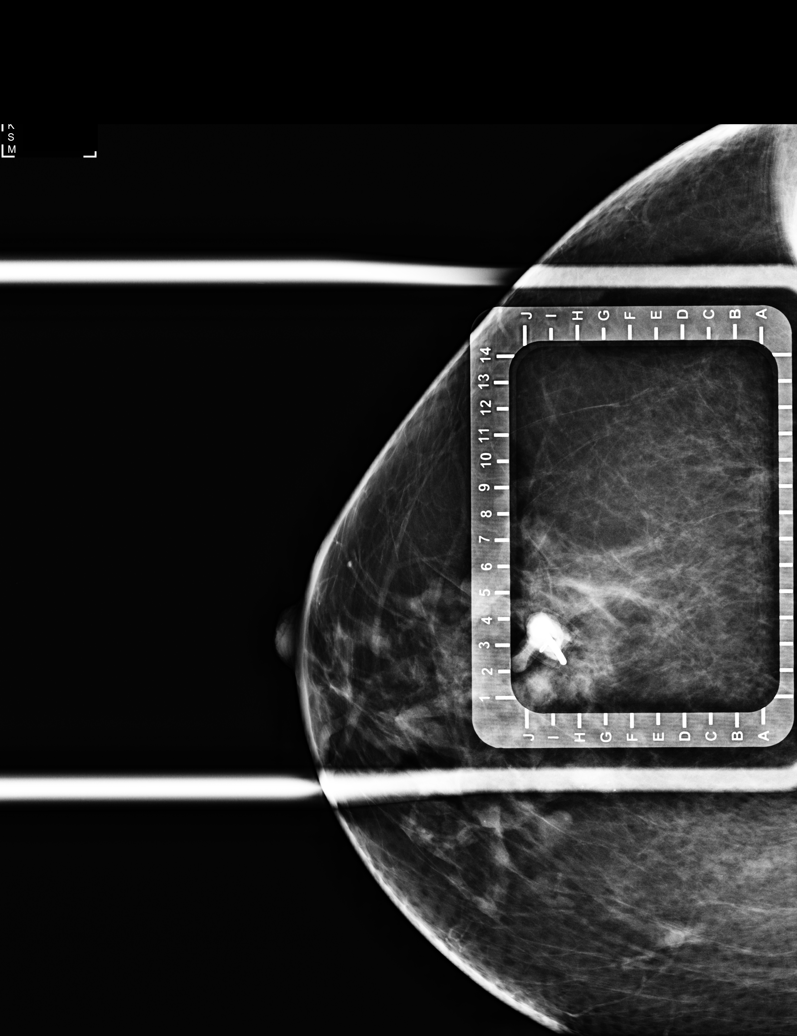

[R CC (3 of 3)]
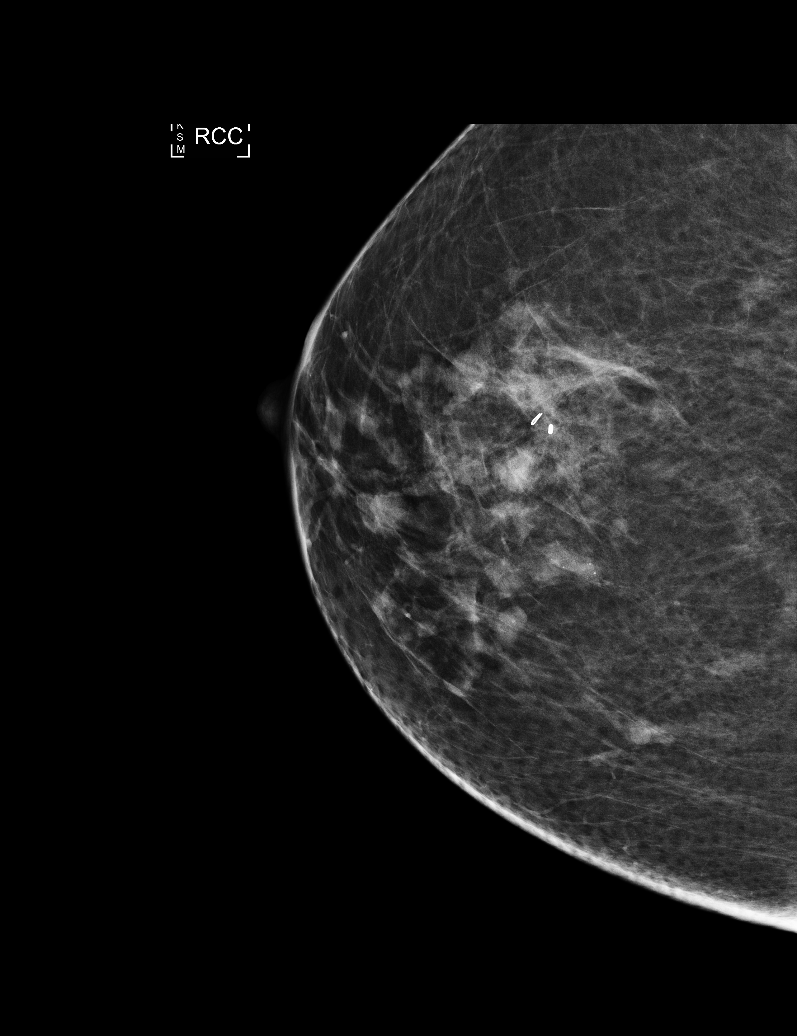

[R ML (3 of 3)]
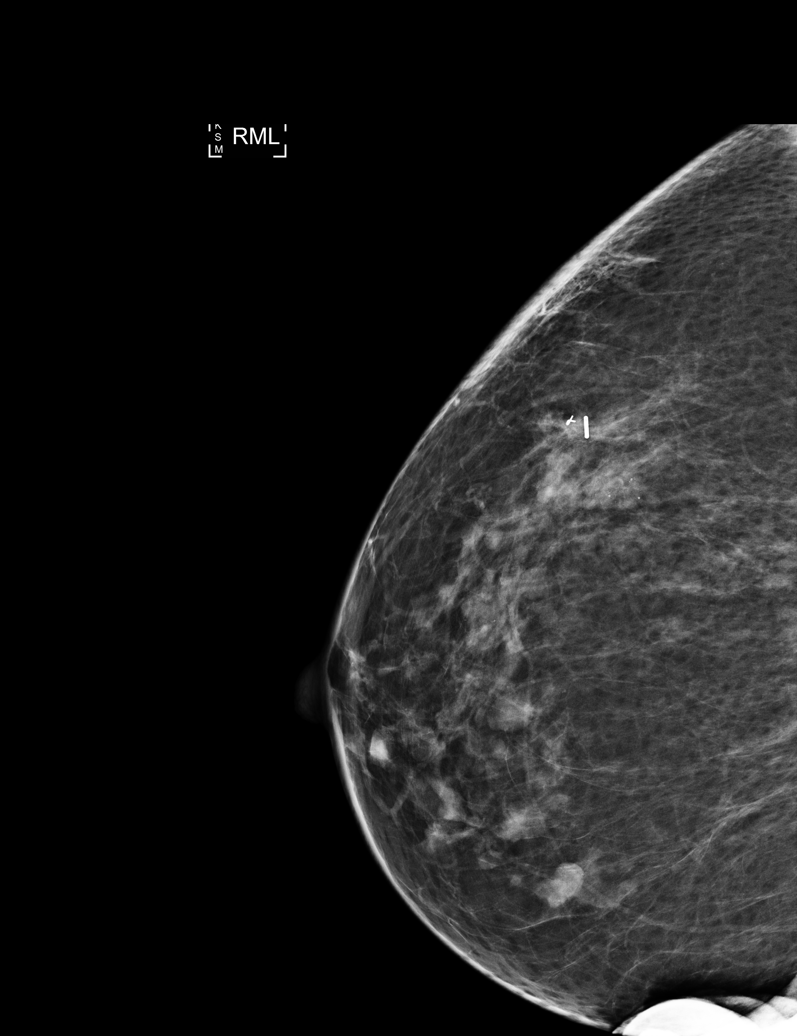

[6 of 6 positions shown; findings below may reference images not displayed]

FINDINGS: Patient presents for radioactive seed localization prior to right
breast lumpectomy. I met with the patient and we discussed the
procedure of seed localization including benefits and alternatives.
We discussed the high likelihood of a successful procedure. We
discussed the risks of the procedure including infection, bleeding,
tissue injury and further surgery. We discussed the low dose of
radioactivity involved in the procedure. Informed, written consent
was given.

The usual time-out protocol was performed immediately prior to the
procedure.

Using mammographic guidance, sterile technique, 1% lidocaine and an
[id number] radioactive seed, architectural distortion containing ribbon
shaped marker in the right upper central breast was localized using
a superior approach. The seed is located 2 mm posterior to the post
biopsy marker. The follow-up mammogram images confirm the seed in
the expected location and were marked for Dr. Teane.

Follow-up survey of the patient confirms presence of the radioactive
seed.

Order number of [id number] seed:  [id number].

Total activity: 0.249 millicurie reference Date: 25 Mar. 2018

The patient tolerated the procedure well and was released from the
[REDACTED]. She was given instructions regarding seed removal.
IMPRESSION: Radioactive seed localization right breast. No apparent
complications.

## 2020-09-04 DIAGNOSIS — H5213 Myopia, bilateral: Secondary | ICD-10-CM | POA: Diagnosis not present

## 2020-09-04 DIAGNOSIS — H2513 Age-related nuclear cataract, bilateral: Secondary | ICD-10-CM | POA: Diagnosis not present

## 2020-09-04 DIAGNOSIS — H33303 Unspecified retinal break, bilateral: Secondary | ICD-10-CM | POA: Diagnosis not present

## 2020-10-24 ENCOUNTER — Other Ambulatory Visit: Payer: Self-pay | Admitting: Family Medicine

## 2020-11-04 ENCOUNTER — Encounter: Payer: Self-pay | Admitting: Family Medicine

## 2020-11-04 ENCOUNTER — Ambulatory Visit (INDEPENDENT_AMBULATORY_CARE_PROVIDER_SITE_OTHER): Payer: Medicare HMO | Admitting: Family Medicine

## 2020-11-04 ENCOUNTER — Other Ambulatory Visit: Payer: Self-pay

## 2020-11-04 VITALS — BP 130/86 | HR 65 | Temp 98.0°F | Ht 66.0 in | Wt 199.6 lb

## 2020-11-04 DIAGNOSIS — Z170 Estrogen receptor positive status [ER+]: Secondary | ICD-10-CM

## 2020-11-04 DIAGNOSIS — C50411 Malignant neoplasm of upper-outer quadrant of right female breast: Secondary | ICD-10-CM

## 2020-11-04 DIAGNOSIS — E034 Atrophy of thyroid (acquired): Secondary | ICD-10-CM

## 2020-11-04 DIAGNOSIS — I10 Essential (primary) hypertension: Secondary | ICD-10-CM

## 2020-11-04 DIAGNOSIS — E785 Hyperlipidemia, unspecified: Secondary | ICD-10-CM

## 2020-11-04 DIAGNOSIS — I700 Atherosclerosis of aorta: Secondary | ICD-10-CM | POA: Diagnosis not present

## 2020-11-04 LAB — COMPREHENSIVE METABOLIC PANEL
ALT: 57 U/L — ABNORMAL HIGH (ref 0–35)
AST: 52 U/L — ABNORMAL HIGH (ref 0–37)
Albumin: 4.5 g/dL (ref 3.5–5.2)
Alkaline Phosphatase: 75 U/L (ref 39–117)
BUN: 17 mg/dL (ref 6–23)
CO2: 28 mEq/L (ref 19–32)
Calcium: 10.2 mg/dL (ref 8.4–10.5)
Chloride: 101 mEq/L (ref 96–112)
Creatinine, Ser: 0.79 mg/dL (ref 0.40–1.20)
GFR: 75.77 mL/min (ref >60.00)
Glucose, Bld: 81 mg/dL (ref 70–99)
Potassium: 4 mEq/L (ref 3.5–5.1)
Sodium: 139 mEq/L (ref 135–145)
Total Bilirubin: 0.9 mg/dL (ref 0.2–1.2)
Total Protein: 7.1 g/dL (ref 6.0–8.3)

## 2020-11-04 LAB — TSH: TSH: 1.23 u[IU]/mL (ref 0.35–4.50)

## 2020-11-04 LAB — LDL CHOLESTEROL, DIRECT: Direct LDL: 84 mg/dL

## 2020-11-04 MED ORDER — GABAPENTIN 100 MG PO CAPS: ORAL_CAPSULE | ORAL | 6 refills | Status: DC

## 2020-11-04 NOTE — Patient Instructions (Addendum)
Health Maintenance Due  Topic Date Due  . COLONOSCOPY - in discussions with Dr. Havery Moros 05/27/2020  . COVID-19 Vaccine - Will schedule.  11/01/2020   Please stop by lab before you go If you have mychart- we will send your results within 3 business days of [place] receiving them.  If you do not have mychart- we will call you about results within 5 business days of [place] receiving them.  *please also note that you will see labs on mychart as soon as they post. I will later go in and write notes on them- will say "notes from Dr. Yong Channel"  If you would like- You are eligible to schedule your annual wellness visit with our nurse specialist Otila Kluver.  Please consider scheduling this before you leave today  Recommended follow up: Return in about 6 months (around 05/06/2021) for physical or sooner if needed.

## 2020-11-04 NOTE — Progress Notes (Signed)
Phone [phone] In person visit   Subjective:   Christine Leon is a 70 y.o. year old very pleasant female patient who presents for/with See problem oriented charting Chief Complaint  Patient presents with  . Hypertension  . Hypothyroidism  . Hyperlipidemia   This visit occurred during the SARS-CoV-2 public health emergency.  Safety protocols were in place, including screening questions prior to the visit, additional usage of staff PPE, and extensive cleaning of exam room while observing appropriate contact time as indicated for disinfecting solutions.   Past Medical History-  Patient Active Problem List   Diagnosis Date Noted  . Malignant neoplasm of upper-outer quadrant of right breast in female, estrogen receptor positive ([place]) 04/22/2018    Priority: High  . Aortic atherosclerosis ([place]) 04/15/2017    Priority: Medium  . Hot flashes 08/15/2014    Priority: Medium  . Obesity 08/15/2014    Priority: Medium  . Hypothyroidism 07/19/2014    Priority: Medium  . CELIAC SPRUE 04/20/2008    Priority: Medium  . Essential hypertension 06/05/2007    Priority: Medium  . Retinal tear 08/15/2014    Priority: Low  . Left knee pain 07/25/2014    Priority: Low  . Allergic rhinitis 07/20/2007    Priority: Low  . Hyperlipidemia 05/06/2020  . History of adenomatous polyp of colon 06/07/2017    Medications- reviewed and updated Current Outpatient Medications  Medication Sig Dispense Refill  . ALPHA LIPOIC ACID PO Take 1 tablet by mouth daily.    Marland Kitchen anastrozole (ARIMIDEX) 1 MG tablet Take 1 tablet (1 mg total) by mouth daily. 90 tablet 4  . Cholecalciferol (VITAMIN D-3 PO) Take 1,500 Units by mouth daily.    . fish oil-omega-3 fatty acids 1000 MG capsule Take 1 g by mouth daily.    . fluticasone (FLONASE) 50 MCG/ACT nasal spray Place 2 sprays into both nostrils daily. 16 g 6  . gabapentin (NEURONTIN) 300 MG capsule TAKE 1 CAPSULE BY MOUTH EVERY DAY AT BEDTIME 90 capsule 4  .  glucosamine-chondroitin 500-400 MG tablet Take 1 tablet by mouth daily.    Marland Kitchen levothyroxine (SYNTHROID) 112 MCG tablet TAKE 1 TABLET BY MOUTH EVERY DAY 90 tablet 3  . lisinopril-hydrochlorothiazide (ZESTORETIC) 20-25 MG tablet TAKE 1 TABLET BY MOUTH EVERY DAY 90 tablet 3  . rosuvastatin (CRESTOR) 10 MG tablet Take 1 tablet (10 mg total) by mouth once a week. 13 tablet 3  . vitamin B-12 (CYANOCOBALAMIN) 500 MCG tablet Take 500 mcg by mouth daily.    Marland Kitchen gabapentin (NEURONTIN) 100 MG capsule TAKE 1 CAPSULE BY MOUTH 2 (TWO) TIMES DAILY. THEN TAKE 300 MG BEFORE BED (RX FROM DR. MAGRINAT originally- printing from Dr. Yong Channel to assist with price comparison ) 180 capsule 6   No current facility-administered medications for this visit.     Objective:  BP 130/86   Pulse 65   Temp 98 F (36.7 C) (Temporal)   Ht 5' 6"  (1.676 m)   Wt 199 lb 9.6 oz (90.5 kg)   LMP  (LMP Unknown)   SpO2 99%   BMI 32.22 kg/m  Gen: NAD, resting comfortably CV: RRR no murmurs rubs or gallops Lungs: CTAB no crackles, wheeze, rhonchi Abdomen: soft/nontender/nondistended/normal bowel sounds.  Ext: no edema Skin: warm, dry    Assessment and Plan    #Breast cancer- patient had good follow up visit with Dr. Jana Hakim on 05/06/20 with good report- remains on anastrozole continue current meds with stability  #hypertension S: medication: Lisinopril-hctz 20-25MG -  working with Physiological scientist- down about 5 lbs and enjoying it once a week and then doing gym other days of the week for 2 more days BP Readings from Last 3 Encounters:  11/04/20 130/86  05/06/20 (!) 143/69  05/06/20 (!) 148/90  A/P: Stable. Continue current medications.   #hyperlipidemia S: Medication: Rosuvastatin 10MG once a week started last visit Lab Results  Component Value Date   CHOL 175 05/06/2020   HDL 46 (L) 05/06/2020   LDLCALC 103 (H) 05/06/2020   TRIG 160 (H) 05/06/2020   CHOLHDL 3.8 05/06/2020   A/P: update LDL today- on new start  rosuvastatin once a week. Also on tylenol once a day- will monitor liver function- had mild elevations last visit- with weight loss hoping improved  Lab Results  Component Value Date   ALT 56 (H) 05/06/2020   AST 50 (H) 05/06/2020   ALKPHOS 75 04/06/2019   BILITOT 0.7 05/06/2020   #hypothyroidism S: compliant On thyroid medication- levothyroxine 112Mcg  Lab Results  Component Value Date   TSH 1.44 05/06/2020   A/P:hopefuly stable- update TSH with labs   Recommended follow up: Return in about 6 months (around 05/06/2021) for physical or sooner if needed. Future Appointments  Date Time Provider [place]  05/06/2021  9:00 AM [facility] None  05/06/2021  9:30 AM Magrinat, Virgie Dad, MD [facility] None    Lab/Order associations:   ICD-10-CM   1. Essential hypertension  I10 Comprehensive metabolic panel  2. Hypothyroidism due to acquired atrophy of thyroid  E03.4 TSH  3. Hyperlipidemia, unspecified hyperlipidemia type  E78.5 Comprehensive metabolic panel    LDL cholesterol, direct  4. Aortic atherosclerosis ([facility])  I70.0   5. Malignant neoplasm of upper-outer quadrant of right breast in female, estrogen receptor positive ([place]) Chronic C50.411    Z17.0    Meds ordered this encounter  Medications  . gabapentin (NEURONTIN) 100 MG capsule    Sig: TAKE 1 CAPSULE BY MOUTH 2 (TWO) TIMES DAILY. THEN TAKE 300 MG BEFORE BED (RX FROM DR. MAGRINAT originally- printing from Dr. Yong Channel to assist with price comparison )    Dispense:  180 capsule    Refill:  6   Return precautions advised.  Garret Reddish, MD

## 2020-11-05 ENCOUNTER — Other Ambulatory Visit: Payer: Self-pay

## 2020-11-05 MED ORDER — ROSUVASTATIN CALCIUM 10 MG PO TABS: 10.0000 mg | ORAL_TABLET | ORAL | 3 refills | Status: DC

## 2020-11-13 DIAGNOSIS — Z03818 Encounter for observation for suspected exposure to other biological agents ruled out: Secondary | ICD-10-CM | POA: Diagnosis not present

## 2021-01-22 ENCOUNTER — Other Ambulatory Visit: Payer: Self-pay | Admitting: Family Medicine

## 2021-03-06 ENCOUNTER — Telehealth: Payer: Self-pay | Admitting: Pharmacist

## 2021-03-06 NOTE — Chronic Care Management (AMB) (Addendum)
    Chronic Care Management Pharmacy Assistant   Name: Christine Leon [facility]  MRN: [id number] DOB: 1950/12/17   Reason for Encounter: General Adherence Call    Recent office visits:  11/04/2020 Wonda Cerise, MD; chronic follow up, no medication changes.  Recent consult visits:  None  Hospital visits:  None in previous 6 months  Medications: Outpatient Encounter Medications as of 03/06/2021  Medication Sig   ALPHA LIPOIC ACID PO Take 1 tablet by mouth daily.   anastrozole (ARIMIDEX) 1 MG tablet Take 1 tablet (1 mg total) by mouth daily.   Cholecalciferol (VITAMIN D-3 PO) Take 1,500 Units by mouth daily.   fish oil-omega-3 fatty acids 1000 MG capsule Take 1 g by mouth daily.   fluticasone (FLONASE) 50 MCG/ACT nasal spray Place 2 sprays into both nostrils daily.   gabapentin (NEURONTIN) 100 MG capsule TAKE 1 CAPSULE BY MOUTH 2 (TWO) TIMES DAILY. THEN TAKE 300 MG BEFORE BED (RX FROM DR. MAGRINAT originally- printing from Dr. Yong Channel to assist with price comparison )   gabapentin (NEURONTIN) 300 MG capsule TAKE 1 CAPSULE BY MOUTH EVERY DAY AT BEDTIME   glucosamine-chondroitin 500-400 MG tablet Take 1 tablet by mouth daily.   levothyroxine (SYNTHROID) 112 MCG tablet TAKE 1 TABLET BY MOUTH EVERY DAY   lisinopril-hydrochlorothiazide (ZESTORETIC) 20-25 MG tablet TAKE 1 TABLET BY MOUTH EVERY DAY   rosuvastatin (CRESTOR) 10 MG tablet Take 1 tablet (10 mg total) by mouth 2 (two) times a week.   vitamin B-12 (CYANOCOBALAMIN) 500 MCG tablet Take 500 mcg by mouth daily.   No facility-administered encounter medications on file as of 03/06/2021.   Patient Questions: Have you had any problems recently with your health? Patient states she has not had any problems recently with her health.  Have you had any problems with your pharmacy? Patient states she has not had any problems recently with her pharmacy.  What issues or side effects are you having with your medications? Patient states  she is not currently having any issues or side effects with any of her medications.  What would you like me to pass along to Leata Mouse, CPP for him to help you with?  Patient states she does not have anything to pass along at this time.  What can we do to take care of you better? Patient did not have any suggestions.  Future Appointments  Date Time Provider [place]  05/06/2021  9:00 AM [facility] None  05/06/2021  9:30 AM Magrinat, Virgie Dad, MD [facility] None  05/16/2021  9:20 AM Marin Olp, MD [facility]     Star Rating Drugs: Lisinopril-HCTZ 20-25 mg last filled 01/22/2021 90 DS Rosuvastatin 10 mg last filled 02/21/2021 90 DS  April D Calhoun, [place] Pharmacist Assistant [phone]

## 2021-03-21 ENCOUNTER — Other Ambulatory Visit: Payer: Self-pay | Admitting: Oncology

## 2021-03-21 DIAGNOSIS — Z853 Personal history of malignant neoplasm of breast: Secondary | ICD-10-CM

## 2021-03-21 DIAGNOSIS — Z9889 Other specified postprocedural states: Secondary | ICD-10-CM

## 2021-04-24 DIAGNOSIS — D1801 Hemangioma of skin and subcutaneous tissue: Secondary | ICD-10-CM | POA: Diagnosis not present

## 2021-04-25 ENCOUNTER — Ambulatory Visit
Admission: RE | Admit: 2021-04-25 | Discharge: 2021-04-25 | Disposition: A | Discharge status: Home / Self Care | Payer: Medicare HMO | Source: Ambulatory Visit | Attending: Oncology | Admitting: Oncology

## 2021-04-25 ENCOUNTER — Other Ambulatory Visit: Payer: Self-pay

## 2021-04-25 DIAGNOSIS — Z9889 Other specified postprocedural states: Secondary | ICD-10-CM

## 2021-04-25 DIAGNOSIS — Z853 Personal history of malignant neoplasm of breast: Secondary | ICD-10-CM

## 2021-04-25 DIAGNOSIS — R922 Inconclusive mammogram: Secondary | ICD-10-CM | POA: Diagnosis not present

## 2021-05-01 ENCOUNTER — Ambulatory Visit (INDEPENDENT_AMBULATORY_CARE_PROVIDER_SITE_OTHER): Payer: Medicare HMO | Admitting: Family Medicine

## 2021-05-01 ENCOUNTER — Encounter: Payer: Self-pay | Admitting: Family Medicine

## 2021-05-01 ENCOUNTER — Other Ambulatory Visit: Payer: Self-pay

## 2021-05-01 VITALS — BP 148/88 | HR 64 | Temp 97.9°F | Wt 202.4 lb

## 2021-05-01 DIAGNOSIS — I10 Essential (primary) hypertension: Secondary | ICD-10-CM | POA: Diagnosis not present

## 2021-05-01 DIAGNOSIS — R1011 Right upper quadrant pain: Secondary | ICD-10-CM | POA: Diagnosis not present

## 2021-05-01 DIAGNOSIS — M546 Pain in thoracic spine: Secondary | ICD-10-CM

## 2021-05-01 DIAGNOSIS — R300 Dysuria: Secondary | ICD-10-CM

## 2021-05-01 LAB — POC URINALSYSI DIPSTICK (AUTOMATED)
Bilirubin, UA: NEGATIVE
Blood, UA: NEGATIVE
Glucose, UA: NEGATIVE
Ketones, UA: NEGATIVE
Nitrite, UA: NEGATIVE
Protein, UA: NEGATIVE
Spec Grav, UA: 1.02 (ref 1.010–1.025)
Urobilinogen, UA: 0.2 E.U./dL
pH, UA: 6 (ref 5.0–8.0)

## 2021-05-01 NOTE — Patient Instructions (Addendum)
Health Maintenance Due  Topic Date Due   Zoster Vaccines- Shingrix (1 of 2) -Please check with your pharmacy to see if they have the shingrix vaccine. If they do- please get this immunization and update [place] by phone call or mychart with dates you receive the vaccine. -hold off until feeling better Never done   COLONOSCOPY- discuss at next visit 05/27/2020   COVID-19 Vaccine (4 - Booster for [employer] series) - Recommend getting Omicron/Bivalent booster only at your local pharmacy! Please let us know when you have received this vaccination. -hold off until feeling better  06/28/2020   INFLUENZA VACCINE  - hold off until feeling better  02/10/2021   Please stop by lab before you go If you have mychart- we will send your results within 3 business days of [place] receiving them.  If you do not have mychart- we will call you about results within 5 business days of [place] receiving them.  *please also note that you will see labs on mychart as soon as they post. I will later go in and write notes on them- will say "notes from Dr. Yong Channel"  Your blood pressure was slightly elevated today, I recommend you purchase an at home blood pressure cuff and do some home monitoring before your next visit. Otherwise, you may continue your current medication.  For your right rib/back pain, you could try using a heating pad or icing the area.  If you pain continues to persist over the next week, please let me know and we may move forward with scheduling an ultrasound. Also let me know if you have any new or worsening symptoms.blood work could also change plan and push [place] towards  imaging  Recommended follow up: Return for as needed for new, worsening, persistent symptoms ALSO keep next already scheduled visit.

## 2021-05-01 NOTE — Addendum Note (Signed)
Addended by: Jodell Cipro on: 05/01/2021 09:36 AM   Modules accepted: Orders

## 2021-05-01 NOTE — Progress Notes (Signed)
Phone [phone] In person visit   Subjective:   Christine Leon is a 70 y.o. year old very pleasant female patient who presents for/with See problem oriented charting Chief Complaint  Patient presents with   Back Pain    Started working out last week on a rowing machine. Pain started in the lower back and has now radiated around the right side under her ribs      This visit occurred during the SARS-CoV-2 public health emergency.  Safety protocols were in place, including screening questions prior to the visit, additional usage of staff PPE, and extensive cleaning of exam room while observing appropriate contact time as indicated for disinfecting solutions.   Past Medical History-  Patient Active Problem List   Diagnosis Date Noted   Malignant neoplasm of upper-outer quadrant of right breast in female, estrogen receptor positive ([place]) 04/22/2018    Priority: 1.   Aortic atherosclerosis ([place]) 04/15/2017    Priority: 2.   Hot flashes 08/15/2014    Priority: 2.   Obesity 08/15/2014    Priority: 2.   Hypothyroidism 07/19/2014    Priority: 2.   CELIAC SPRUE 04/20/2008    Priority: 2.   Essential hypertension 06/05/2007    Priority: 2.   Retinal tear 08/15/2014    Priority: 3.   Left knee pain 07/25/2014    Priority: 3.   Allergic rhinitis 07/20/2007    Priority: 3.   Hyperlipidemia 05/06/2020   History of adenomatous polyp of colon 06/07/2017    Medications- reviewed and updated Current Outpatient Medications  Medication Sig Dispense Refill   ALPHA LIPOIC ACID PO Take 1 tablet by mouth daily.     anastrozole (ARIMIDEX) 1 MG tablet Take 1 tablet (1 mg total) by mouth daily. 90 tablet 4   Cholecalciferol (VITAMIN D-3 PO) Take 1,500 Units by mouth daily.     fish oil-omega-3 fatty acids 1000 MG capsule Take 1 g by mouth daily.     fluticasone (FLONASE) 50 MCG/ACT nasal spray Place 2 sprays into both nostrils daily. 16 g 6   gabapentin (NEURONTIN) 100 MG capsule  TAKE 1 CAPSULE BY MOUTH 2 (TWO) TIMES DAILY. THEN TAKE 300 MG BEFORE BED (RX FROM DR. MAGRINAT originally- printing from Dr. Yong Channel to assist with price comparison ) 180 capsule 6   gabapentin (NEURONTIN) 300 MG capsule TAKE 1 CAPSULE BY MOUTH EVERY DAY AT BEDTIME 90 capsule 4   glucosamine-chondroitin 500-400 MG tablet Take 1 tablet by mouth daily.     levothyroxine (SYNTHROID) 112 MCG tablet TAKE 1 TABLET BY MOUTH EVERY DAY 90 tablet 3   lisinopril-hydrochlorothiazide (ZESTORETIC) 20-25 MG tablet TAKE 1 TABLET BY MOUTH EVERY DAY 90 tablet 3   rosuvastatin (CRESTOR) 10 MG tablet Take 1 tablet (10 mg total) by mouth 2 (two) times a week. 26 tablet 3   vitamin B-12 (CYANOCOBALAMIN) 500 MCG tablet Take 500 mcg by mouth daily.     No current facility-administered medications for this visit.     Objective:  BP (!) 148/88   Pulse 64   Temp 97.9 F (36.6 C) (Temporal)   Wt 202 lb 6.4 oz (91.8 kg)   LMP  (LMP Unknown)   SpO2 97%   BMI 32.67 kg/m  Gen: NAD, resting comfortably CV: RRR no murmurs rubs or gallops Lungs: CTAB no crackles, wheeze, rhonchi Abdomen: soft/moderately tender in right upper quadrant-wraps around and almost dermatomal distribution-no rash in this area-no significant pain in lateral portion mid axillary line/nondistended/normal bowel sounds. No  rebound or guarding.  Ext: no edema Skin: warm, dry    Assessment and Plan   #social update- had great trip to [place] for a month  # right sided Rib as well as right thoracic Back Pain (started here) S: Chief Complaint  Patient presents with   Back Pain    Started working out last week on a rowing machine. Pain started in the lower back and has now radiated around the right side under her ribs    Was working with trainer- rode 1000 meters. Felt like a tweak at first but by that night could not move in bed. Continued to worsen then wrapped around to front side of ribs/just under the ribs. 8 hour tylenol helps sometimes and  sometimes not. Mild nausea. No constipation. No fevers. Does not worsen with eating. Laying down worsens the pain especially fat on back. Sleeps on left side. Pain at the moment 3/10 but within last 48 hours up to peak 8/10 at its worst. Has not tried heat or ice yet (baseline feels hot) no rash on that side- did have zostavax.   S/p cholecystectomy. Doesn't feel typical of prior gallbladder issues  Ros- no chest pain or shortness of breath. Slightly pleuritic nature to pain A/P: Patient started with right thoracic back pain which then radiated just below her right ribs after working on a rowing machine-Per history would seem to be more consistent with muscular but difficult to reproduce with certain positions of the laying down (we discussed could be muscular strain such as obliques or radiating from thoracic spine)-patient is concerned this could be beyond musculoskeletal -We opted to get some baseline blood work-states has some slightly cloudy urine-she wonders about kidney stones-I think this is less likely.  If elevation of WBC or anemia or significant LFT elevation could consider ultrasound or if symptoms are persistent though she does not have her gallbladder-we will be more evaluating for cyst in the liver or obstructive flow-does not appear jaundiced -shingles on differential but less likely with no rash over a week in think this is less likely -possible thoracic spine films if persistent symptoms as well -Discussed trial of icing as she does not tolerate heat very well.  She does not want to trial a muscle relaxant at this time -She knows to follow-up with new or worsening symptoms or if persistent over the next week -Thankfully we already have follow-up in early November which will be a good chance to check in  #hypertension S: medication: lisinopril-hctz 20-25 mg daily Home readings #s: no recent checks BP Readings from Last 3 Encounters:  05/01/21 (!) 148/88  11/04/20 130/86  05/06/20  (!) 143/69  A/P: Blood pressure mildly elevated today but did not sleep well last night and is in pain-she can do some home monitoring before next visit but for now we will continue current medication  Recommended follow up: Return for as needed for new, worsening, persistent symptoms ALSO keep next already scheduled visit. Future Appointments  Date Time Provider [place]  05/06/2021  9:00 AM [facility] None  05/06/2021  9:30 AM Magrinat, Virgie Dad, MD [facility] None  05/16/2021  9:20 AM Yong Channel [name], MD [facility]    Lab/Order associations:   ICD-10-CM   1. Right upper quadrant abdominal pain  R10.11 CBC with Differential/Platelet    Comprehensive metabolic panel    Lipase    POCT Urinalysis Dipstick (Automated)    2. Acute right-sided thoracic back pain  M54.6 CBC with  Differential/Platelet    Comprehensive metabolic panel    3. Essential hypertension  I10       Time Spent: 30 minutes of total time (8:33 AM- 9:03 AM) was spent on the date of the encounter performing the following actions: chart review prior to seeing the patient, obtaining history, performing a medically necessary exam, counseling on the treatment plan, placing orders, and documenting in our EHR.     I,Harris Phan,acting as a Education administrator for Garret Reddish, MD.,have documented all relevant documentation on the behalf of Garret Reddish, MD,as directed by  Garret Reddish, MD while in the presence of Garret Reddish, MD.   I, Garret Reddish, MD, have reviewed all documentation for this visit. The documentation on 05/01/21 for the exam, diagnosis, procedures, and orders are all accurate and complete.   Return precautions advised.  Garret Reddish, MD

## 2021-05-01 NOTE — Addendum Note (Signed)
Addended by: Loura Back on: 05/01/2021 10:31 AM   Modules accepted: Orders

## 2021-05-02 ENCOUNTER — Encounter: Payer: Self-pay | Admitting: Family Medicine

## 2021-05-02 ENCOUNTER — Ambulatory Visit (INDEPENDENT_AMBULATORY_CARE_PROVIDER_SITE_OTHER): Payer: Medicare HMO

## 2021-05-02 DIAGNOSIS — M546 Pain in thoracic spine: Secondary | ICD-10-CM

## 2021-05-02 DIAGNOSIS — R1011 Right upper quadrant pain: Secondary | ICD-10-CM

## 2021-05-02 DIAGNOSIS — R300 Dysuria: Secondary | ICD-10-CM | POA: Diagnosis not present

## 2021-05-02 NOTE — Telephone Encounter (Signed)
Okay to schedule [place]?

## 2021-05-02 NOTE — Addendum Note (Signed)
Addended by: Octaviano Glow on: 05/02/2021 03:18 PM   Modules accepted: Orders

## 2021-05-02 NOTE — Addendum Note (Signed)
Addended by: Rosalee Kaufman L on: 05/02/2021 11:13 AM   Modules accepted: Orders

## 2021-05-03 LAB — COMPREHENSIVE METABOLIC PANEL
AG Ratio: 1.8 (calc) (ref 1.0–2.5)
ALT: 53 U/L — ABNORMAL HIGH (ref 6–29)
AST: 46 U/L — ABNORMAL HIGH (ref 10–35)
Albumin: 4.4 g/dL (ref 3.6–5.1)
Alkaline phosphatase (APISO): 71 U/L (ref 37–153)
BUN: 13 mg/dL (ref 7–25)
CO2: 27 mmol/L (ref 20–32)
Calcium: 10 mg/dL (ref 8.6–10.4)
Chloride: 103 mmol/L (ref 98–110)
Creat: 0.79 mg/dL (ref 0.60–1.00)
Globulin: 2.4 g/dL (calc) (ref 1.9–3.7)
Glucose, Bld: 147 mg/dL — ABNORMAL HIGH (ref 65–99)
Potassium: 3.5 mmol/L (ref 3.5–5.3)
Sodium: 142 mmol/L (ref 135–146)
Total Bilirubin: 0.8 mg/dL (ref 0.2–1.2)
Total Protein: 6.8 g/dL (ref 6.1–8.1)

## 2021-05-03 LAB — CBC WITH DIFFERENTIAL/PLATELET
Absolute Monocytes: 261 cells/uL (ref 200–950)
Basophils Absolute: 17 cells/uL (ref 0–200)
Basophils Relative: 0.3 %
Eosinophils Absolute: 58 cells/uL (ref 15–500)
Eosinophils Relative: 1 %
HCT: 44.2 % (ref 35.0–45.0)
Hemoglobin: 14.7 g/dL (ref 11.7–15.5)
Lymphs Abs: 2552 cells/uL (ref 850–3900)
MCH: 30.1 pg (ref 27.0–33.0)
MCHC: 33.3 g/dL (ref 32.0–36.0)
MCV: 90.4 fL (ref 80.0–100.0)
MPV: 10.1 fL (ref 7.5–12.5)
Monocytes Relative: 4.5 %
Neutro Abs: 2912 cells/uL (ref 1500–7800)
Neutrophils Relative %: 50.2 %
Platelets: 253 10*3/uL (ref 140–400)
RBC: 4.89 10*6/uL (ref 3.80–5.10)
RDW: 13.4 % (ref 11.0–15.0)
Total Lymphocyte: 44 %
WBC: 5.8 10*3/uL (ref 3.8–10.8)

## 2021-05-03 LAB — LIPASE: Lipase: 27 U/L (ref 7–60)

## 2021-05-04 LAB — URINE CULTURE
MICRO NUMBER:: 12537040
SPECIMEN QUALITY:: ADEQUATE

## 2021-05-05 ENCOUNTER — Telehealth: Payer: Self-pay

## 2021-05-05 ENCOUNTER — Encounter (HOSPITAL_BASED_OUTPATIENT_CLINIC_OR_DEPARTMENT_OTHER): Payer: Self-pay | Admitting: *Deleted

## 2021-05-05 ENCOUNTER — Emergency Department (HOSPITAL_BASED_OUTPATIENT_CLINIC_OR_DEPARTMENT_OTHER): Payer: Medicare HMO

## 2021-05-05 ENCOUNTER — Other Ambulatory Visit: Payer: Self-pay

## 2021-05-05 ENCOUNTER — Emergency Department (HOSPITAL_BASED_OUTPATIENT_CLINIC_OR_DEPARTMENT_OTHER)
Admission: EM | Admit: 2021-05-05 | Discharge: 2021-05-05 | Disposition: A | Discharge status: Home / Self Care | Payer: Medicare HMO | Attending: Emergency Medicine | Admitting: Emergency Medicine

## 2021-05-05 ENCOUNTER — Encounter: Payer: Self-pay | Admitting: Family Medicine

## 2021-05-05 DIAGNOSIS — Z87891 Personal history of nicotine dependence: Secondary | ICD-10-CM | POA: Insufficient documentation

## 2021-05-05 DIAGNOSIS — Z79899 Other long term (current) drug therapy: Secondary | ICD-10-CM | POA: Diagnosis not present

## 2021-05-05 DIAGNOSIS — Z853 Personal history of malignant neoplasm of breast: Secondary | ICD-10-CM | POA: Diagnosis not present

## 2021-05-05 DIAGNOSIS — K760 Fatty (change of) liver, not elsewhere classified: Secondary | ICD-10-CM | POA: Diagnosis not present

## 2021-05-05 DIAGNOSIS — E039 Hypothyroidism, unspecified: Secondary | ICD-10-CM | POA: Diagnosis not present

## 2021-05-05 DIAGNOSIS — R110 Nausea: Secondary | ICD-10-CM | POA: Diagnosis not present

## 2021-05-05 DIAGNOSIS — R109 Unspecified abdominal pain: Secondary | ICD-10-CM | POA: Diagnosis not present

## 2021-05-05 DIAGNOSIS — R1013 Epigastric pain: Secondary | ICD-10-CM | POA: Insufficient documentation

## 2021-05-05 DIAGNOSIS — I10 Essential (primary) hypertension: Secondary | ICD-10-CM | POA: Insufficient documentation

## 2021-05-05 LAB — CBC WITH DIFFERENTIAL/PLATELET
Abs Immature Granulocytes: 0.02 10*3/uL (ref 0.00–0.07)
Basophils Absolute: 0 10*3/uL (ref 0.0–0.1)
Basophils Relative: 1 %
Eosinophils Absolute: 0.1 10*3/uL (ref 0.0–0.5)
Eosinophils Relative: 2 %
HCT: 41.4 % (ref 36.0–46.0)
Hemoglobin: 14.6 g/dL (ref 12.0–15.0)
Immature Granulocytes: 0 %
Lymphocytes Relative: 39 %
Lymphs Abs: 2.1 10*3/uL (ref 0.7–4.0)
MCH: 31.1 pg (ref 26.0–34.0)
MCHC: 35.3 g/dL (ref 30.0–36.0)
MCV: 88.3 fL (ref 80.0–100.0)
Monocytes Absolute: 0.3 10*3/uL (ref 0.1–1.0)
Monocytes Relative: 6 %
Neutro Abs: 2.8 10*3/uL (ref 1.7–7.7)
Neutrophils Relative %: 52 %
Platelets: 240 10*3/uL (ref 150–400)
RBC: 4.69 MIL/uL (ref 3.87–5.11)
RDW: 13.4 % (ref 11.5–15.5)
WBC: 5.4 10*3/uL (ref 4.0–10.5)
nRBC: 0 % (ref 0.0–0.2)

## 2021-05-05 LAB — COMPREHENSIVE METABOLIC PANEL
ALT: 65 U/L — ABNORMAL HIGH (ref 0–44)
AST: 53 U/L — ABNORMAL HIGH (ref 15–41)
Albumin: 4 g/dL (ref 3.5–5.0)
Alkaline Phosphatase: 65 U/L (ref 38–126)
Anion gap: 10 (ref 5–15)
BUN: 12 mg/dL (ref 8–23)
CO2: 28 mmol/L (ref 22–32)
Calcium: 9.7 mg/dL (ref 8.9–10.3)
Chloride: 101 mmol/L (ref 98–111)
Creatinine, Ser: 0.75 mg/dL (ref 0.44–1.00)
GFR, Estimated: >60 mL/min (ref >60)
Glucose, Bld: 121 mg/dL — ABNORMAL HIGH (ref 70–99)
Potassium: 3.3 mmol/L — ABNORMAL LOW (ref 3.5–5.1)
Sodium: 139 mmol/L (ref 135–145)
Total Bilirubin: 0.8 mg/dL (ref 0.3–1.2)
Total Protein: 7.2 g/dL (ref 6.5–8.1)

## 2021-05-05 LAB — LIPASE, BLOOD: Lipase: 33 U/L (ref 11–51)

## 2021-05-05 MED ORDER — ONDANSETRON 4 MG PO TBDP
4.0000 mg | ORAL_TABLET | Freq: Once | ORAL | Status: AC
  Administered 2021-05-05: 4 mg via ORAL
  Filled 2021-05-05: qty 1

## 2021-05-05 MED ORDER — DICYCLOMINE HCL 20 MG PO TABS: 20.0000 mg | ORAL_TABLET | Freq: Two times a day (BID) | ORAL | 0 refills | Status: DC

## 2021-05-05 MED ORDER — IOHEXOL 300 MG/ML  SOLN
100.0000 mL | Freq: Once | INTRAMUSCULAR | Status: AC | PRN
  Administered 2021-05-05: 100 mL via INTRAVENOUS

## 2021-05-05 NOTE — Discharge Instructions (Signed)
As we discussed your CT did not show any abnormality that may be explaining her stomach pain today.  You may try the Bentyl I am sending in as it can help with crampy stomach pain.  You may also try over-the-counter antacid medication such as Tums, or Pepcid as it could be related to some acid reflux or gastric ulcer although as we discussed this is not completely consistent with the symptoms you have been experiencing.  I do encourage you to follow-up with your gastroenterologist at your earliest convenience.  Please return if your symptoms worsen or fail to improve.

## 2021-05-05 NOTE — ED Triage Notes (Signed)
Presents today with c/o abd pain, stabbing type pain, radiates to rt side under ribs. Keeps reoccurring for the past 3 weeks, has been seen by her Primary MD. Having intermittent nausea, no vomiting. No diarrhea. Has noted a change in BM. No fevers. Appetite has decreased and eating seems to exacerbate her pain

## 2021-05-05 NOTE — Telephone Encounter (Signed)
Was seen in ED

## 2021-05-05 NOTE — ED Provider Notes (Signed)
[facility] EMERGENCY DEPARTMENT Provider Note   CSN: [id number] Arrival date & time: 05/05/21  1028     History Chief Complaint  Patient presents with   Abdominal Pain    Christine Leon is a 70 y.o. female with a past medical history significant for breast cancer treated 2 years ago with excision and radiation therapy, history of cholecystectomy due to gallstones, previous hysterectomy who presents with 3 weeks of intermittent epigastric versus right upper quadrant abdominal pain.  Patient reports the pain is colicky in nature, sharp, stabbing.  Patient reports that she initially felt the pain radiating into her back, she often feels that it is on her right side underneath the ribs.  Patient reports that she saw her primary care doctor who ran basic lab work which was all normal, normal lipase, chronically elevated LFTs which are her baseline.   Abdominal Pain Associated symptoms: nausea       Past Medical History:  Diagnosis Date   Allergy    Celiac sprue    History of radiation therapy 05/26/18- 06/29/18   Right Breast 15 fractions for a total dose of 40.05 Gy. Right Breast boost 5 fractions for a total dose of 10 Gy   Hypertension    Hypothyroidism    Personal history of radiation therapy 2019   PONV (postoperative nausea and vomiting)     Patient Active Problem List   Diagnosis Date Noted   Hyperlipidemia 05/06/2020   Malignant neoplasm of upper-outer quadrant of right breast in female, estrogen receptor positive ([place]) 04/22/2018   History of adenomatous polyp of colon 06/07/2017   Aortic atherosclerosis ([place]) 04/15/2017   Hot flashes 08/15/2014   Retinal tear 08/15/2014   Obesity 08/15/2014   Left knee pain 07/25/2014   Hypothyroidism 07/19/2014   CELIAC SPRUE 04/20/2008   Allergic rhinitis 07/20/2007   Essential hypertension 06/05/2007    Past Surgical History:  Procedure Laterality Date   ABDOMINAL HYSTERECTOMY     in 3s. fibroids.  including cervix removal. per patient was told no further pap smears after.    arthroscopic knee surgery     2015 under Dr. Theda Sers   BREAST BIOPSY Right 04/01/2018   BREAST LUMPECTOMY Right 2019   BREAST LUMPECTOMY WITH RADIOACTIVE SEED AND SENTINEL LYMPH NODE BIOPSY Right 04/27/2018   Procedure: RIGHT BREAST LUMPECTOMY WITH RADIOACTIVE SEED AND RIGHT AXILLARYSENTINEL LYMPH NODE BIOPSY ERAS PATHWAY;  Surgeon: Rolm Bookbinder, MD;  Location: [facility];  Service: General;  Laterality: Right;  PECTORAL BLOCK   CHOLECYSTECTOMY     COCCYX REMOVAL     after cheerleading accident   OOPHORECTOMY     in 78s   TONSILLECTOMY       OB History   No obstetric history on file.     Family History  Problem Relation Age of Onset   Hypertension Mother    Coronary artery disease Father        45   COPD Father        passed age 88   Pulmonary fibrosis Father    Breast cancer Maternal Aunt    Pulmonary fibrosis Paternal Aunt    Stomach cancer Cousin        Paternal   Colon cancer Neg Hx    Esophageal cancer Neg Hx    Pancreatic cancer Neg Hx    Liver disease Neg Hx     Social History   Tobacco Use   Smoking status: Former    Packs/day: 1.00  Years: 39.00    Pack years: 39.00    Types: Cigarettes    Quit date: 07/13/2006    Years since quitting: 14.8   Smokeless tobacco: Never  Vaping Use   Vaping Use: Never used  Substance Use Topics   Alcohol use: Yes    Comment: socially   Drug use: No    Home Medications Prior to Admission medications   Medication Sig Start Date End Date Taking? Authorizing Provider  dicyclomine (BENTYL) 20 MG tablet Take 1 tablet (20 mg total) by mouth 2 (two) times daily. 05/05/21  Yes Nimah Uphoff H, PA-C  ALPHA LIPOIC ACID PO Take 1 tablet by mouth daily.    [provider]  anastrozole (ARIMIDEX) 1 MG tablet Take 1 tablet (1 mg total) by mouth daily. 05/06/20   Magrinat, Virgie Dad, MD  Cholecalciferol (VITAMIN D-3  PO) Take 1,500 Units by mouth daily.    [provider]  fish oil-omega-3 fatty acids 1000 MG capsule Take 1 g by mouth daily.    [provider]  fluticasone (FLONASE) 50 MCG/ACT nasal spray Place 2 sprays into both nostrils daily. 08/12/16   Nafziger, Tommi Rumps, NP  gabapentin (NEURONTIN) 100 MG capsule TAKE 1 CAPSULE BY MOUTH 2 (TWO) TIMES DAILY. THEN TAKE 300 MG BEFORE BED (RX FROM DR. MAGRINAT originally- printing from Dr. Yong Channel to assist with price comparison ) 11/04/20   Marin Olp, MD  gabapentin (NEURONTIN) 300 MG capsule TAKE 1 CAPSULE BY MOUTH EVERY DAY AT BEDTIME 05/06/20   Magrinat, Virgie Dad, MD  glucosamine-chondroitin 500-400 MG tablet Take 1 tablet by mouth daily.    [provider]  levothyroxine (SYNTHROID) 112 MCG tablet TAKE 1 TABLET BY MOUTH EVERY DAY 10/24/20   Marin Olp, MD  lisinopril-hydrochlorothiazide (ZESTORETIC) 20-25 MG tablet TAKE 1 TABLET BY MOUTH EVERY DAY 01/22/21   Marin Olp, MD  rosuvastatin (CRESTOR) 10 MG tablet Take 1 tablet (10 mg total) by mouth 2 (two) times a week. 11/07/20   Marin Olp, MD  vitamin B-12 (CYANOCOBALAMIN) 500 MCG tablet Take 500 mcg by mouth daily.    [provider]    Allergies    Gluten meal, Wheat bran, and Cephalexin  Review of Systems   Review of Systems  Gastrointestinal:  Positive for abdominal pain and nausea.  All other systems reviewed and are negative.  Physical Exam Updated Vital Signs BP 120/66 (BP Location: Right Arm)   Pulse (!) 57   Temp 98.1 F (36.7 C) (Oral)   Resp 18   Ht 5' 5"  (1.651 m)   Wt 90.7 kg   LMP  (LMP Unknown)   SpO2 97%   BMI 33.28 kg/m   Physical Exam Vitals and nursing note reviewed.  Constitutional:      General: She is not in acute distress.    Appearance: Normal appearance.  HENT:     Head: Normocephalic and atraumatic.  Eyes:     General:        Right eye: No discharge.        Left eye: No discharge.  Cardiovascular:      Rate and Rhythm: Normal rate and regular rhythm.     Heart sounds: No murmur heard.   No friction rub. No gallop.  Pulmonary:     Effort: Pulmonary effort is normal.     Breath sounds: Normal breath sounds.  Abdominal:     General: Bowel sounds are normal.     Palpations: Abdomen is  soft.     Comments: Some tenderness to palpation right upper quadrant, epigastric region.  No rebound, or today, guarding.  Negative Murphy sign.  No hemorrhage or other abnormality on the skin surface of the abdomen.  No suprapubic tenderness, no CVA tenderness.  Skin:    General: Skin is warm and dry.     Capillary Refill: Capillary refill takes less than 2 seconds.  Neurological:     Mental Status: She is alert and oriented to person, place, and time.  Psychiatric:        Mood and Affect: Mood normal.        Behavior: Behavior normal.    ED Results / Procedures / Treatments   Labs (all labs ordered are listed, but only abnormal results are displayed) Labs Reviewed  COMPREHENSIVE METABOLIC PANEL - Abnormal; Notable for the following components:      Result Value   Potassium 3.3 (*)    Glucose, Bld 121 (*)    AST 53 (*)    ALT 65 (*)    All other components within normal limits  CBC WITH DIFFERENTIAL/PLATELET  LIPASE, BLOOD    EKG None  Radiology CT ABDOMEN PELVIS W CONTRAST  Result Date: 05/05/2021 CLINICAL DATA:  Epigastric abdominal pain. EXAM: CT ABDOMEN AND PELVIS WITH CONTRAST TECHNIQUE: Multidetector CT imaging of the abdomen and pelvis was performed using the standard protocol following bolus administration of intravenous contrast. CONTRAST:  136m OMNIPAQUE IOHEXOL 300 MG/ML  SOLN COMPARISON:  CT December 27, 2016 FINDINGS: Lower chest: No acute abnormality. Hepatobiliary: Diffuse hepatic steatosis. No suspicious hepatic lesion. Gallbladder surgically absent. No biliary ductal dilation. Pancreas: No pancreatic ductal dilation or evidence of acute inflammation. Spleen: Spines within normal  limits. Adrenals/Urinary Tract: Bilateral adrenal glands are unremarkable. No hydronephrosis. No solid enhancing renal mass. Urinary bladder is unremarkable for degree of distension. Stomach/Bowel: No enteric contrast material was administered. Stomach is unremarkable for degree of distension. No pathologic dilation of small bowel. The appendix and terminal appear normal. Colonic diverticulosis without findings of acute diverticulitis. Vascular/Lymphatic: Aortic and branch vessel atherosclerosis without abdominal aortic aneurysm. No pathologically enlarged lymph nodes in the abdomen or pelvis. Prominent periportal lymph nodes measuring up to 7 mm on image 26/2 stable dating back to CT December 27, 2016 and favored reactive. Reproductive: Status post hysterectomy. No adnexal masses. Other: No abdominopelvic ascites.  No pneumoperitoneum. Musculoskeletal: No acute or significant osseous findings. Multilevel degenerative changes spine. Degenerative changes bilateral hips and SI joints. Chronic changes at the pubic symphysis of osteitis pubis. IMPRESSION: 1. No acute findings in the abdomen or pelvis. 2. Colonic diverticulosis without findings of acute diverticulitis. 3. Hepatic steatosis. 4. Aortic Atherosclerosis (ICD10-I70.0). Electronically Signed   By: JDahlia BailiffM.D.   On: 05/05/2021 12:59    Procedures Procedures   Medications Ordered in ED Medications  iohexol (OMNIPAQUE) 300 MG/ML solution 100 mL (100 mLs Intravenous Contrast Given 05/05/21 1217)  ondansetron (ZOFRAN-ODT) disintegrating tablet 4 mg (4 mg Oral Given 05/05/21 1154)    ED Course  I have reviewed the triage vital signs and the nursing notes.  Pertinent labs & imaging results that were available during my care of the patient were reviewed by me and considered in my medical decision making (see chart for details).    MDM Rules/Calculators/A&P                         I discussed this case with my attending physician who cosigned  this note including patient's presenting symptoms, physical exam, and planned diagnostics and interventions. Attending physician stated agreement with plan or made changes to plan which were implemented.   Attending physician assessed patient at bedside.  Patient with 3 weeks of intermittent abdominal pain.  Patient does have some respecters for acute abdomen including multiple intra-abdominal surgeries, including cholecystectomy, hysterectomy.  Patient also had breast cancer treated with excision, radiation proximately 2 years ago.  Patient does follow with a gastroenterologist has history of celiac's disease.  Patient also has established diagnosis of hepatic steatosis with chronically elevated LFTs.  Given patient's age and risk factors will obtain CT of the abdomen to rule out acute surgical abdomen, or other acute intra-abdominal abnormalities.    CT reveals hepatic steatosis, diverticulosis without diverticulitis, no other explanation for patient's symptoms.  Discussed possibly related to gastritis, gastroenteritis, ulcers, although patient's description is not fully consistent with these diagnoses.  Lab work today without abnormality, stable compared to lab work obtained on Friday at patient's primary care.  Patient did not provide a urine sample today, however denies any new dysuria, or blood in urine, and urinalysis on Friday was without abnormality. We will trial Bentyl prescription, encouraged follow-up with gastroenterologist.  Return precautions given.  Patient discharged in stable condition. Final Clinical Impression(s) / ED Diagnoses Final diagnoses:  Epigastric pain    Rx / DC Orders ED Discharge Orders          Ordered    dicyclomine (BENTYL) 20 MG tablet  2 times daily        05/05/21 1332             Jermy Couper, [place] H, PA-C 05/05/21 1429    Lucrezia Starch, MD 05/06/21 2040

## 2021-05-05 NOTE — Telephone Encounter (Signed)
Patient Name: Christine Leon Gender: Female DOB: 1950-10-11 Age: 70 Y 11 M 16 D Return Phone Number: [phone] (Primary) Address: City/ State/ Zip: [place]  [address] Client [facility] [place] [facility] [place] [facility] Physician Garret Reddish- MD Contact Type Call Who Is Calling Patient / Member / Family / Caregiver Call Type Triage / Clinical Relationship To Patient Self Return Phone Number [phone] (Primary) Chief Complaint SEVERE ABDOMINAL PAIN - Severe pain in abdomen Reason for Call Symptomatic / Request for [place] states patient is having right sided abdominal spasm pain. Translation No Nurse Assessment Nurse: Raenette Rover, RN, Zella Ball Date/Time (Eastern Time): 05/05/2021 9:13:09 AM Confirm and document reason for call. If symptomatic, describe symptoms. ---Caller states patient is having right sided abdominal spasm pain. Began 3 weeks ago and was seen last week for it. Pain is brought on by pain. Cant see GI until Dec 1st. It will wake her up out of a dead sleep and hurts so bad it takes her breath. Does the patient have any new or worsening symptoms? ---Yes Will a triage be completed? ---Yes Related visit to physician within the last 2 weeks? ---Yes Does the PT have any chronic conditions? (i.e. diabetes, asthma, this includes High risk factors for pregnancy, etc.) ---Yes List chronic conditions. ---htn Is this a behavioral health or substance abuse call? ---No Guidelines Guideline Title Affirmed Question Affirmed Notes Nurse Date/Time Eilene Ghazi Time) Abdominal Pain - Upper [1] Pain lasts > 10 minutes AND [2] age > 72 Raenette Rover, RN, Zella Ball 05/05/2021 9:15:23 AM PLEASE NOTE: All timestamps contained within this report are represented as [place] Standard Time. CONFIDENTIALTY NOTICE: This fax transmission is intended only for the addressee. It  contains information that is legally privileged, confidential or otherwise protected from use or disclosure. If you are not the intended recipient, you are strictly prohibited from reviewing, disclosing, copying using or disseminating any of this information or taking any action in reliance on or regarding this information. If you have received this fax in error, please notify us immediately by telephone so that we can arrange for its return to [place]. Phone: [phone], Toll-Free: [phone], Fax: [phone] Page: 2 of 2 Call Id: [id number] [place]. Time Eilene Ghazi Time) Disposition Final User 05/05/2021 9:11:19 AM Send to Urgent Queue Silvestre Moment 05/05/2021 9:17:27 AM Go to ED Now Yes Raenette Rover, RN, Herbert Deaner Disagree/Comply Comply Caller Understands Yes PreDisposition Search internet for information Care Advice Given Per Guideline GO TO ED NOW: * Leave now. Drive carefully. * Another adult should drive. BRING MEDICINES: CALL EMS 911 IF: * Confusion occurs * Passes out or becomes too weak to stand * Severe difficulty breathing occurs. CARE ADVICE given per Abdominal Pain, Upper (Adult) guideline. Comments User: Wilson Singer, RN Date/Time Eilene Ghazi Time): 05/05/2021 9:18:03 AM Rates pain 6/10 on scale of 1-10 Referrals [facility] - ED

## 2021-05-05 NOTE — Progress Notes (Signed)
[facility]  [phone] Fax:([phone]    ID: Christine Leon DOB: 10-Jan-1951  MR#: [id number]  IRW#:[id number]  Patient Care Team: Marin Olp, MD as PCP - General (Family Medicine) Rolm Bookbinder, MD as Consulting Physician (General Surgery) Friend Dorfman, Virgie Dad, MD as Consulting Physician (Oncology) Eppie Gibson, MD as Attending Physician (Radiation Oncology) Armbruster, Carlota Raspberry, MD as Consulting Physician (Gastroenterology) Madelin Rear, [facility] as Pharmacist (Pharmacist) OTHER MD:   CHIEF COMPLAINT: Estrogen receptor positive breast cancer  CURRENT TREATMENT: anastrozole   INTERVAL HISTORY: Christine Leon returns today for follow-up of her estrogen receptor positive breast cancer.   She continues on anastrozole.  She is tolerating well and is making good use of bedtime gabapentin, which she finds very effective.  Her most recent bone density screening from 04/07/2019 showed a T-score of 0.0, which is considered normal.  Since her last visit, she underwent bilateral diagnostic mammography with tomography at [facility] on 04/25/2021 showing: breast density category B; no evidence of malignancy in either breast.  There is an area in the 12:30 position 6 cm from the nipple which is palpable and measures 1.4 cm by ultrasound consistent with fat necrosis.  Of note, she presented to the ED yesterday, 05/04/2021, with epigastric pain. She underwent CT abdomen/pelvis at that time showing no acute findings. Colonic diverticulosis and hepatic steatosis were noted. She was discharged with dicyclomine.   REVIEW OF SYSTEMS: Christine Leon went on a rowing trip about 3 weeks ago and since that time she has had a pain in her back, which comes and goes.  It does not allow her to sleep on her right side.  Sometimes it radiates around the rib cage on the right primarily.  She is not aware of any position in particular that provokes this.  She describes the pain as  sharp and she cannot pinpoint the spot in her back where it appears to be originating from.  It does not seem to be associated with any change in bowel or bladder habits.  Aside from that she is exercising and going with a trainer twice a week and she is planning some Halloween decorations with her grandchildren later this week.  She is concerned about her weight.  A detailed review of systems was otherwise stable  COVID 19 VACCINATION STATUS: [place] x3, last 04/2020   HISTORY OF CURRENT ILLNESS: From the original intake note:  Christine Cobbins Baines "Christine Leon" had previous mammography in 2016 showing distortion of the right breast with benign findings along with a 50-monthfollow up. She did not have further mammography until she had bilateral diagnostic mammography with tomography and right breast ultrasonography at [facility] 03/29/2018 showing: breast density category B. There was a suspicious mass in the right breast upper outer quadrant at 12 o'clock measuring 0.5 x 0.4 x 0.4 cm. There were multiple adjacent cysts. There was no sonographic evidence of abnormal lymph nodes or adenopathy.   Accordingly on 03/30/2018 she proceeded to biopsy of the right breast area in question. The pathology from this procedure showed (([id number]: Invasive ductal carcinoma, grade I. Prognostic indicators significant for: estrogen receptor, 100% positive and progesterone receptor, 80% positive, both with string staining intensity. Proliferation marker Ki67 at 3%. HER2 negative with an immunohistochemistry of (1+).  The patient's subsequent history is as detailed below.   PAST MEDICAL HISTORY: Past Medical History:  Diagnosis Date   Allergy    Celiac sprue    History of radiation therapy 05/26/18- 06/29/18  Right Breast 15 fractions for a total dose of 40.05 Gy. Right Breast boost 5 fractions for a total dose of 10 Gy   Hypertension    Hypothyroidism    Personal history of radiation therapy 2019   PONV  (postoperative nausea and vomiting)     PAST SURGICAL HISTORY: Past Surgical History:  Procedure Laterality Date   ABDOMINAL HYSTERECTOMY     in 77s. fibroids. including cervix removal. per patient was told no further pap smears after.    arthroscopic knee surgery     2015 under Dr. Theda Sers   BREAST BIOPSY Right 04/01/2018   BREAST LUMPECTOMY Right 2019   BREAST LUMPECTOMY WITH RADIOACTIVE SEED AND SENTINEL LYMPH NODE BIOPSY Right 04/27/2018   Procedure: RIGHT BREAST LUMPECTOMY WITH RADIOACTIVE SEED AND RIGHT AXILLARYSENTINEL LYMPH NODE BIOPSY ERAS PATHWAY;  Surgeon: Rolm Bookbinder, MD;  Location: [facility];  Service: General;  Laterality: Right;  PECTORAL BLOCK   CHOLECYSTECTOMY     COCCYX REMOVAL     after cheerleading accident   OOPHORECTOMY     in 51s   TONSILLECTOMY    Hysterectomy with BSO (fibroids)   FAMILY HISTORY Family History  Problem Relation Age of Onset   Hypertension Mother    Coronary artery disease Father        65   COPD Father        passed age 38   Pulmonary fibrosis Father    Breast cancer Maternal Aunt    Pulmonary fibrosis Paternal Aunt    Stomach cancer Cousin        Paternal   Colon cancer Neg Hx    Esophageal cancer Neg Hx    Pancreatic cancer Neg Hx    Liver disease Neg Hx   The patient's father died at age 53 due to COPD and heart valve issues. The patient's mother is alive at age 26. The patient had 1 brother (deceased) and no sisters. There was a maternal aunt who was diagnosed with breast cancer in the 50's. The patient' denies any other family members with breast, ovarian, or other cancers.  The patient does not meet criteria for genetics testing   GYNECOLOGIC HISTORY:  No LMP recorded (lmp unknown). Patient has had a hysterectomy. Menarche: [age] years old Age at first live birth: 70 years old  She is GX P2. She is status post total hysterectomy with BSO in 1995. Her LMP was in her late 24's. She took HRT for a few  years and recently discontinued.     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 Her husband, Christine Leon, is retired from working in Scientist, physiological for [employer]. [name] recently suffered from a medical incident and Christine Leon has been caring for him. The patient's oldest, a son named Christine Leon, lives in [place] and works as a [employer]th Land. The patient's youngest, a daughter named Christine Leon, lives in [place] and works as a Print production planner. The patient has 4 grandchildren. The patient is not a church attender   ADVANCED DIRECTIVES: Her husband recently suffered a medical incident, so she plans on naming her son, Christine Leon, and her daughter as well, as Medical Powers of Attorney.     HEALTH MAINTENANCE: Social History   Tobacco Use   Smoking status: Former    Packs/day: 1.00    Years: 39.00    Pack years: 39.00    Types: Cigarettes    Quit date: 07/13/2006    Years since  quitting: 14.8   Smokeless tobacco: Never  Vaping Use   Vaping Use: Never used  Substance Use Topics   Alcohol use: Yes    Comment: socially   Drug use: No    Colonoscopy: 2018/ Dr. Havery Moros (repeat in 2 years)  PAP:  Bone density: scheduled for 2019   Allergies  Allergen Reactions   Gluten Meal     Has celic   Wheat Bran     Has celic    Cephalexin Hives    Current Outpatient Medications  Medication Sig Dispense Refill   ALPHA LIPOIC ACID PO Take 1 tablet by mouth daily.     anastrozole (ARIMIDEX) 1 MG tablet Take 1 tablet (1 mg total) by mouth daily. 90 tablet 4   Cholecalciferol (VITAMIN D-3 PO) Take 1,500 Units by mouth daily.     dicyclomine (BENTYL) 20 MG tablet Take 1 tablet (20 mg total) by mouth 2 (two) times daily. 20 tablet 0   fish oil-omega-3 fatty acids 1000 MG capsule Take 1 g by mouth daily.     fluticasone (FLONASE) 50 MCG/ACT nasal spray Place 2 sprays into both nostrils daily. 16 g 6   gabapentin (NEURONTIN) 100 MG capsule TAKE 1 CAPSULE  BY MOUTH 2 (TWO) TIMES DAILY. THEN TAKE 300 MG BEFORE BED (RX FROM DR. Tsion Inghram originally- printing from Dr. Yong Channel to assist with price comparison ) 180 capsule 6   gabapentin (NEURONTIN) 300 MG capsule TAKE 1 CAPSULE BY MOUTH EVERY DAY AT BEDTIME 90 capsule 4   glucosamine-chondroitin 500-400 MG tablet Take 1 tablet by mouth daily.     levothyroxine (SYNTHROID) 112 MCG tablet TAKE 1 TABLET BY MOUTH EVERY DAY 90 tablet 3   lisinopril-hydrochlorothiazide (ZESTORETIC) 20-25 MG tablet TAKE 1 TABLET BY MOUTH EVERY DAY 90 tablet 3   rosuvastatin (CRESTOR) 10 MG tablet Take 1 tablet (10 mg total) by mouth 2 (two) times a week. 26 tablet 3   vitamin B-12 (CYANOCOBALAMIN) 500 MCG tablet Take 500 mcg by mouth daily.     No current facility-administered medications for this visit.    OBJECTIVE: White woman who appears stated age  40:   05/06/21 0922  BP: 133/62  Pulse: 61  Resp: 16  Temp: 97.7 F (36.5 C)  SpO2: 97%      Body mass index is 33.71 kg/m.   Wt Readings from Last 3 Encounters:  05/06/21 202 lb 9.6 oz (91.9 kg)  05/05/21 200 lb (90.7 kg)  05/01/21 202 lb 6.4 oz (91.8 kg)      ECOG FS:1 - Symptomatic but completely ambulatory  Sclerae unicteric, EOMs intact Wearing a mask No cervical or supraclavicular adenopathy Lungs no rales or rhonchi Heart regular rate and rhythm Abd soft, nontender, positive bowel sounds MSK no focal spinal tenderness, no upper extremity lymphedema Neuro: nonfocal, well oriented, appropriate affect Breasts: The right breast is status postlumpectomy and radiation.  There is no evidence of local recurrence although there is a palpable mass in the superior anterior aspect not associated with any skin changes.  On the recent mammogram and ultrasound this was noted to be an area of fat necrosis associated with scar.  Left breast and both axillae are benign   LAB RESULTS:  CMP     Component Value Date/Time   NA 139 05/05/2021 1210   K 3.3 (L)  05/05/2021 1210   CL 101 05/05/2021 1210   CO2 28 05/05/2021 1210   GLUCOSE 121 (H) 05/05/2021 1210   BUN 12 05/05/2021  1210   CREATININE 0.75 05/05/2021 1210   CREATININE 0.79 05/02/2021 1528   CALCIUM 9.7 05/05/2021 1210   PROT 7.2 05/05/2021 1210   ALBUMIN 4.0 05/05/2021 1210   AST 53 (H) 05/05/2021 1210   ALT 65 (H) 05/05/2021 1210   ALKPHOS 65 05/05/2021 1210   BILITOT 0.8 05/05/2021 1210   GFRNONAA >60 05/05/2021 1210   GFRNONAA 87 05/06/2020 1140   GFRAA 101 05/06/2020 1140    Lab Results  Component Value Date   WBC 4.9 05/06/2021   NEUTROABS 2.3 05/06/2021   HGB 13.9 05/06/2021   HCT 40.1 05/06/2021   MCV 87.6 05/06/2021   PLT 223 05/06/2021   No results found for: LABCA2  No components found for: [id number]  No results for input(s): INR in the last 168 hours.  No results found for: LABCA2  No results found for: KPV374  No results found for: MOL078  No results found for: MLJ449  No results found for: CA2729  No components found for: HGQUANT  No results found for: CEA1 / No results found for: CEA1   No results found for: AFPTUMOR  No results found for: CHROMOGRNA  No results found for: TOTALPROTELP, ALBUMINELP, A1GS, A2GS, BETS, BETA2SER, GAMS, MSPIKE, SPEI (this displays SPEP labs)  No results found for: KPAFRELGTCHN, LAMBDASER, KAPLAMBRATIO (kappa/lambda light chains)  No results found for: HGBA, HGBA2QUANT, HGBFQUANT, HGBSQUAN (Hemoglobinopathy evaluation)   No results found for: LDH  Lab Results  Component Value Date   IRON 69 04/24/2020   IRONPCTSAT 20.4 04/24/2020   (Iron and TIBC)  Lab Results  Component Value Date   FERRITIN 256.0 04/24/2020    Urinalysis    Component Value Date/Time   COLORURINE YELLOW 12/27/2016 0626   APPEARANCEUR CLEAR 12/27/2016 0626   LABSPEC 1.006 12/27/2016 0626   PHURINE 7.0 12/27/2016 0626   GLUCOSEU NEGATIVE 12/27/2016 0626   HGBUR NEGATIVE 12/27/2016 0626   BILIRUBINUR neg 05/01/2021 1031    KETONESUR NEGATIVE 12/27/2016 0626   PROTEINUR Negative 05/01/2021 1031   PROTEINUR NEGATIVE 12/27/2016 0626   UROBILINOGEN 0.2 05/01/2021 1031   NITRITE neg 05/01/2021 1031   NITRITE NEGATIVE 12/27/2016 0626   LEUKOCYTESUR Trace (A) 05/01/2021 1031    STUDIES: CT ABDOMEN PELVIS W CONTRAST  Result Date: 05/05/2021 CLINICAL DATA:  Epigastric abdominal pain. EXAM: CT ABDOMEN AND PELVIS WITH CONTRAST TECHNIQUE: Multidetector CT imaging of the abdomen and pelvis was performed using the standard protocol following bolus administration of intravenous contrast. CONTRAST:  144m OMNIPAQUE IOHEXOL 300 MG/ML  SOLN COMPARISON:  CT December 27, 2016 FINDINGS: Lower chest: No acute abnormality. Hepatobiliary: Diffuse hepatic steatosis. No suspicious hepatic lesion. Gallbladder surgically absent. No biliary ductal dilation. Pancreas: No pancreatic ductal dilation or evidence of acute inflammation. Spleen: Spines within normal limits. Adrenals/Urinary Tract: Bilateral adrenal glands are unremarkable. No hydronephrosis. No solid enhancing renal mass. Urinary bladder is unremarkable for degree of distension. Stomach/Bowel: No enteric contrast material was administered. Stomach is unremarkable for degree of distension. No pathologic dilation of small bowel. The appendix and terminal appear normal. Colonic diverticulosis without findings of acute diverticulitis. Vascular/Lymphatic: Aortic and branch vessel atherosclerosis without abdominal aortic aneurysm. No pathologically enlarged lymph nodes in the abdomen or pelvis. Prominent periportal lymph nodes measuring up to 7 mm on image 26/2 stable dating back to CT December 27, 2016 and favored reactive. Reproductive: Status post hysterectomy. No adnexal masses. Other: No abdominopelvic ascites.  No pneumoperitoneum. Musculoskeletal: No acute or significant osseous findings. Multilevel degenerative changes spine. Degenerative changes bilateral  hips and SI joints. Chronic changes at  the pubic symphysis of osteitis pubis. IMPRESSION: 1. No acute findings in the abdomen or pelvis. 2. Colonic diverticulosis without findings of acute diverticulitis. 3. Hepatic steatosis. 4. Aortic Atherosclerosis (ICD10-I70.0). Electronically Signed   By: Dahlia Bailiff M.D.   On: 05/05/2021 12:59   MM DIAG BREAST TOMO BILATERAL  Result Date: 04/25/2021 CLINICAL DATA:  70 year old with a personal history of malignant RIGHT breast lumpectomy at in 2019 with adjuvant radiation therapy. Annual evaluation. EXAM: DIGITAL DIAGNOSTIC BILATERAL MAMMOGRAM WITH TOMOSYNTHESIS AND CAD TECHNIQUE: Bilateral digital diagnostic mammography and breast tomosynthesis was performed. The images were evaluated with computer-aided detection. COMPARISON:  Previous exam(s). ACR Breast Density Category b: There are scattered areas of fibroglandular density. FINDINGS: Full field CC and MLO views of both breasts and a spot magnification CC view of the lumpectomy site in the RIGHT breast were obtained. RIGHT: No findings suspicious for malignancy. Post lumpectomy scar/architectural distortion and adjacent benign partially calcified oil cysts in the upper breast at middle depth. LEFT: No findings suspicious for malignancy. IMPRESSION: 1. No mammographic evidence of malignancy involving either breast. 2. Expected post lumpectomy changes involving the RIGHT breast. RECOMMENDATION: Per protocol, as the patient is now 2 or more years status post lumpectomy, she may return to annual screening mammography in 1 year. However, given the history of breast cancer, the patient remains eligible for annual diagnostic mammography if preferred. I have discussed the findings and recommendations with the patient. If applicable, a reminder letter will be sent to the patient regarding the next appointment. BI-RADS CATEGORY  2: Benign. Electronically Signed   By: Evangeline Dakin M.D.   On: 04/25/2021 14:34     ELIGIBLE FOR AVAILABLE RESEARCH PROTOCOL:  no   ASSESSMENT: 71 y.o. [place] Ser, [place] woman status post right breast upper outer quadrant biopsy 03/30/2018 for a clinical T1a N0, stage IA invasive ductal carcinoma, grade 1, estrogen and progesterone receptor positive, HER-2 not amplified, with an MIB-1 of 3%.  (1) status post right lumpectomy and sentinel lymph node sampling 04/27/2018 for a pT1c pN0, stage IA invasive ductal carcinoma, grade 1, with negative margins  (a) 1 sentinel lymph node was removed  (2) Oncotype DX score of 17 predicts a risk of recurrence outside the breast over 9 years of 5% if the patient's only systemic therapy is an antiestrogen for 5 years.  It also predicts no benefit from chemotherapy  (3) adjuvant radiation completed 06/28/2018  (4) started tamoxifen 07/27/2017--discontinued secondary to rash  (5) anastrozole started 08/31/2018  (a) bone density 04/07/2019 showed a T score of 0.0 (normal).   PLAN: Christine Leon is now 3 years out from definitive surgery for her breast cancer with no evidence of disease recurrence.  This is very favorable.  She is tolerating anastrozole well and the plan is to continue that a minimum of 5 years.  Her most recent bone density was normal.  This could be repeated next year.  I think the pain she is experiencing is likely due to degenerative disease.  It appears to have been provoked by canoeing or boating and has only been present for 3 weeks.  It is very intermittent.  We discussed obtaining an MRI and she would like to know what the causes.  She understands MRIs frequently do not correlate with the actual reason for back pain.  After much discussion we decided we would check again in 2 weeks and if the pain is still bothering her then we could consider  MRI of the thoracolumbar spine at that time.  Otherwise we are continuing treatment as before and she will return to see [place] November 2023 after her October mammography  Total encounter time 25 minutes.*    Wyolene Weimann, Virgie Dad, MD  05/06/21 9:29 AM Medical Oncology and Hematology [facility] [address] [place],  [address] Tel. [phone]    Fax. [phone]   I, Wilburn Mylar, am acting as scribe for Dr. Virgie Dad. Sam Overbeck.  I, Lurline Del MD, have reviewed the above documentation for accuracy and completeness, and I agree with the above.   *Total Encounter Time as defined by the Centers for Medicare and Medicaid Services includes, in addition to the face-to-face time of a patient visit (documented in the note above) non-face-to-face time: obtaining and reviewing outside history, ordering and reviewing medications, tests or procedures, care coordination (communications with other health care professionals or caregivers) and documentation in the medical record.

## 2021-05-06 ENCOUNTER — Inpatient Hospital Stay: Payer: Medicare HMO

## 2021-05-06 ENCOUNTER — Inpatient Hospital Stay: Payer: Medicare HMO | Attending: Oncology | Admitting: Oncology

## 2021-05-06 VITALS — BP 133/62 | HR 61 | Temp 97.7°F | Resp 16 | Ht 65.0 in | Wt 202.6 lb

## 2021-05-06 DIAGNOSIS — Z79811 Long term (current) use of aromatase inhibitors: Secondary | ICD-10-CM | POA: Diagnosis not present

## 2021-05-06 DIAGNOSIS — Z803 Family history of malignant neoplasm of breast: Secondary | ICD-10-CM | POA: Insufficient documentation

## 2021-05-06 DIAGNOSIS — M549 Dorsalgia, unspecified: Secondary | ICD-10-CM | POA: Insufficient documentation

## 2021-05-06 DIAGNOSIS — Z9079 Acquired absence of other genital organ(s): Secondary | ICD-10-CM | POA: Diagnosis not present

## 2021-05-06 DIAGNOSIS — Z87891 Personal history of nicotine dependence: Secondary | ICD-10-CM | POA: Diagnosis not present

## 2021-05-06 DIAGNOSIS — C50411 Malignant neoplasm of upper-outer quadrant of right female breast: Secondary | ICD-10-CM

## 2021-05-06 DIAGNOSIS — Z90710 Acquired absence of both cervix and uterus: Secondary | ICD-10-CM | POA: Diagnosis not present

## 2021-05-06 DIAGNOSIS — K760 Fatty (change of) liver, not elsewhere classified: Secondary | ICD-10-CM | POA: Diagnosis not present

## 2021-05-06 DIAGNOSIS — Z923 Personal history of irradiation: Secondary | ICD-10-CM | POA: Insufficient documentation

## 2021-05-06 DIAGNOSIS — Z170 Estrogen receptor positive status [ER+]: Secondary | ICD-10-CM

## 2021-05-06 DIAGNOSIS — K5790 Diverticulosis of intestine, part unspecified, without perforation or abscess without bleeding: Secondary | ICD-10-CM | POA: Diagnosis not present

## 2021-05-06 DIAGNOSIS — Z90722 Acquired absence of ovaries, bilateral: Secondary | ICD-10-CM | POA: Insufficient documentation

## 2021-05-06 LAB — CBC WITH DIFFERENTIAL/PLATELET
Abs Immature Granulocytes: 0.01 10*3/uL (ref 0.00–0.07)
Basophils Absolute: 0 10*3/uL (ref 0.0–0.1)
Basophils Relative: 0 %
Eosinophils Absolute: 0.1 10*3/uL (ref 0.0–0.5)
Eosinophils Relative: 2 %
HCT: 40.1 % (ref 36.0–46.0)
Hemoglobin: 13.9 g/dL (ref 12.0–15.0)
Immature Granulocytes: 0 %
Lymphocytes Relative: 44 %
Lymphs Abs: 2.2 10*3/uL (ref 0.7–4.0)
MCH: 30.3 pg (ref 26.0–34.0)
MCHC: 34.7 g/dL (ref 30.0–36.0)
MCV: 87.6 fL (ref 80.0–100.0)
Monocytes Absolute: 0.4 10*3/uL (ref 0.1–1.0)
Monocytes Relative: 7 %
Neutro Abs: 2.3 10*3/uL (ref 1.7–7.7)
Neutrophils Relative %: 47 %
Platelets: 223 10*3/uL (ref 150–400)
RBC: 4.58 MIL/uL (ref 3.87–5.11)
RDW: 13.3 % (ref 11.5–15.5)
WBC: 4.9 10*3/uL (ref 4.0–10.5)
nRBC: 0 % (ref 0.0–0.2)

## 2021-05-06 LAB — COMPREHENSIVE METABOLIC PANEL
ALT: 64 U/L — ABNORMAL HIGH (ref 0–44)
AST: 42 U/L — ABNORMAL HIGH (ref 15–41)
Albumin: 3.9 g/dL (ref 3.5–5.0)
Alkaline Phosphatase: 74 U/L (ref 38–126)
Anion gap: 10 (ref 5–15)
BUN: 13 mg/dL (ref 8–23)
CO2: 25 mmol/L (ref 22–32)
Calcium: 9.5 mg/dL (ref 8.9–10.3)
Chloride: 106 mmol/L (ref 98–111)
Creatinine, Ser: 0.88 mg/dL (ref 0.44–1.00)
GFR, Estimated: >60 mL/min (ref >60)
Glucose, Bld: 122 mg/dL — ABNORMAL HIGH (ref 70–99)
Potassium: 3.8 mmol/L (ref 3.5–5.1)
Sodium: 141 mmol/L (ref 135–145)
Total Bilirubin: 0.6 mg/dL (ref 0.3–1.2)
Total Protein: 6.8 g/dL (ref 6.5–8.1)

## 2021-05-06 MED ORDER — GABAPENTIN 300 MG PO CAPS: ORAL_CAPSULE | ORAL | 4 refills | Status: DC

## 2021-05-06 MED ORDER — ANASTROZOLE 1 MG PO TABS: 1.0000 mg | ORAL_TABLET | Freq: Every day | ORAL | 4 refills | Status: DC

## 2021-05-09 NOTE — Progress Notes (Signed)
Phone [phone]   Subjective:  Patient presents today for their annual physical. Chief complaint-noted.   See problem oriented charting- ROS- full  review of systems was completed and negative except for: back pain, feet numbness  The following were reviewed and entered/updated in epic: Past Medical History:  Diagnosis Date   Allergy    Celiac sprue    History of radiation therapy 05/26/18- 06/29/18   Right Breast 15 fractions for a total dose of 40.05 Gy. Right Breast boost 5 fractions for a total dose of 10 Gy   Hypertension    Hypothyroidism    Personal history of radiation therapy 2019   PONV (postoperative nausea and vomiting)    Patient Active Problem List   Diagnosis Date Noted   Malignant neoplasm of upper-outer quadrant of right breast in female, estrogen receptor positive ([place]) 04/22/2018    Priority: High   Hepatic steatosis 05/06/2021    Priority: Medium    Hyperlipidemia 05/06/2020    Priority: Medium    Aortic atherosclerosis ([place]) 04/15/2017    Priority: Medium    Hot flashes 08/15/2014    Priority: Medium    Obesity 08/15/2014    Priority: Medium    Hypothyroidism 07/19/2014    Priority: Medium    CELIAC SPRUE 04/20/2008    Priority: Medium    Essential hypertension 06/05/2007    Priority: Medium    History of adenomatous polyp of colon 06/07/2017    Priority: Low   Retinal tear 08/15/2014    Priority: Low   Left knee pain 07/25/2014    Priority: Low   Allergic rhinitis 07/20/2007    Priority: Low   Diverticulosis 05/06/2021   Past Surgical History:  Procedure Laterality Date   ABDOMINAL HYSTERECTOMY     in 62s. fibroids. including cervix removal. per patient was told no further pap smears after.    arthroscopic knee surgery     2015 under Dr. Theda Sers   BREAST BIOPSY Right 04/01/2018   BREAST LUMPECTOMY Right 2019   BREAST LUMPECTOMY WITH RADIOACTIVE SEED AND SENTINEL LYMPH NODE BIOPSY Right 04/27/2018   Procedure: RIGHT BREAST  LUMPECTOMY WITH RADIOACTIVE SEED AND RIGHT AXILLARYSENTINEL LYMPH NODE BIOPSY ERAS PATHWAY;  Surgeon: Rolm Bookbinder, MD;  Location: [facility];  Service: General;  Laterality: Right;  PECTORAL BLOCK   CHOLECYSTECTOMY     COCCYX REMOVAL     after cheerleading accident   OOPHORECTOMY     in 70s   TONSILLECTOMY      Family History  Problem Relation Age of Onset   Hypertension Mother    Coronary artery disease Father        40   COPD Father        passed age 64   Pulmonary fibrosis Father    Breast cancer Maternal Aunt    Pulmonary fibrosis Paternal Aunt    Stomach cancer Cousin        Paternal   Colon cancer Neg Hx    Esophageal cancer Neg Hx    Pancreatic cancer Neg Hx    Liver disease Neg Hx     Medications- reviewed and updated Current Outpatient Medications  Medication Sig Dispense Refill   ALPHA LIPOIC ACID PO Take 1 tablet by mouth daily.     anastrozole (ARIMIDEX) 1 MG tablet Take 1 tablet (1 mg total) by mouth daily. 90 tablet 4   Cholecalciferol (VITAMIN D-3 PO) Take 1,500 Units by mouth daily.     dicyclomine (BENTYL) 20 MG  tablet Take 1 tablet (20 mg total) by mouth 2 (two) times daily. 20 tablet 0   fish oil-omega-3 fatty acids 1000 MG capsule Take 1 g by mouth daily.     fluticasone (FLONASE) 50 MCG/ACT nasal spray Place 2 sprays into both nostrils daily. 16 g 6   gabapentin (NEURONTIN) 300 MG capsule TAKE 1 CAPSULE BY MOUTH EVERY DAY AT BEDTIME 90 capsule 4   glucosamine-chondroitin 500-400 MG tablet Take 1 tablet by mouth daily.     levothyroxine (SYNTHROID) 112 MCG tablet TAKE 1 TABLET BY MOUTH EVERY DAY 90 tablet 3   lisinopril-hydrochlorothiazide (ZESTORETIC) 20-25 MG tablet TAKE 1 TABLET BY MOUTH EVERY DAY 90 tablet 3   rosuvastatin (CRESTOR) 10 MG tablet Take 1 tablet (10 mg total) by mouth 2 (two) times a week. 26 tablet 3   vitamin B-12 (CYANOCOBALAMIN) 500 MCG tablet Take 500 mcg by mouth daily.     No current facility-administered  medications for this visit.    Allergies-reviewed and updated Allergies  Allergen Reactions   Gluten Meal     Has celic   Wheat Bran     Has celic    Cephalexin Hives    Social History   Social History Narrative   Married (husband patient of Dr. Yong Channel), 2 children, 1 grandchild   Her mom lives with her. Dad passed 2017- had lived with them.       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      Hobbies: Traveling, but very busy recent years caring for father who is ill   Objective  Objective:  BP 130/84 Comment: retake in the office.  Pulse 66   Temp 97.9 F (36.6 C)   Ht 5' 5"  (1.651 m)   Wt 204 lb (92.5 kg)   LMP  (LMP Unknown)   SpO2 98%   BMI 33.95 kg/m  Gen: NAD, resting comfortably HEENT: Mucous membranes are moist. Oropharynx normal Neck: no thyromegaly CV: RRR no murmurs rubs or gallops Lungs: CTAB no crackles, wheeze, rhonchi Abdomen: soft/nontender on exam today/nondistended/normal bowel sounds. No rebound or guarding.  Ext: no edema Skin: warm, dry Neuro: grossly normal, moves all extremities, PERRLA Appears very uncomfortable climbing onto the table today due to right rib/right thoracic back pain   Assessment and Plan   70 y.o. female presenting for annual physical.  Health Maintenance counseling: 1. Anticipatory guidance: Patient counseled regarding regular dental exams -q6 months, eye exams-Yearly with history torn retina ,  avoiding smoking and second hand smoke , limiting alcohol to 1 beverage per day- once a month . , No illicit drugs  2. Risk factor reduction:  Advised patient of need for regular exercise and diet rich and fruits and vegetables to reduce risk of heart attack and stroke.  Exercise- limited with recent pain- will plan on once abdomen issues resolved- getting back with trainer.  Diet-weight within 1 pound of last year-  reasonably healthy diet- has worked hard on this- some gain in last month from forced sedentary  activity Wt Readings from Last 3 Encounters:  05/16/21 204 lb (92.5 kg)  05/06/21 202 lb 9.6 oz (91.9 kg)  05/05/21 200 lb (90.7 kg)  3. Immunizations/screenings/ancillary studies DISCUSSED:  -Shingrix vaccination #1- recommended at pharmacy once healed -covid booster vaccination #4- recommended at pharmacy once healed -Flu vaccination (last one 10/20) - she will let us know when she gets this Immunization History  Administered Date(s) Administered   Fluad Quad(high Dose 65+) 05/04/2019  Influenza, High Dose Seasonal PF 05/21/2016, 04/19/2018   PFIZER(Purple Top)SARS-COV-2 Vaccination 08/03/2019, 08/22/2019, 05/03/2020   Pneumococcal Conjugate-13 04/14/2016   Pneumococcal Polysaccharide-23 04/15/2017   Td 11/07/2008   Tdap 11/24/2013   Zoster, Live 06/02/2013  4. Cervical cancer screening-  hysterectomy in the past age based screening recommendations.  No vaginal discharge or bleeding-we will defer pelvic exam 5. Breast cancer screening-  Breast cancer follow-up-patient with ongoing treatment plan of Arimidex after prior treatments.  and mammogram 04/25/21-following with Dr. Jana Hakim 6. Colon cancer screening -05/27/2017 with 3-4 year repeat planned - due to polyp history. Also has ulcerative colitis per college records- encouraged her to make sure Dr. Havery Moros is aware.  7. Skin cancer screening- Dr. Jarome Matin as needed. advised regular sunscreen use. Denies worrisome, changing, or new skin lesions.  8. Birth control/STD check- hysterectomy and monogamous 9. Osteoporosis screening at 19- DEXA 04/07/19-normal with Dr. Jana Hakim in 2020. Excellent bone density -Former smoker-quit 2008. Will check UA with labs. 39 pack years and discussed lung cancer screening program but would not qualify with ongoing breast cancer treatment.  Just had urine with no blood so will not repeat at this time  Status of chronic or acute concerns   # ED visit for abdominal pain  #Prior visits with me for  right sided rib as well as right thoracic Back Pain  S:Patient was seen in the ED on 05/05/2021 for an evaluation of abdominal pain. She reported having the pain for 3 weeks and described it to be intermittent and located in the right upper quadrant. Also reported the pain to be colicky in nature, sharp and stabbing and radiated into her back and often felt it on her right side underneath the ribs.  Patient did not provide a urine sample, however denies any new dysuria, or blood in urine.   CT revealed hepatic steatosis, diverticulosis without diverticulitis, no other explanation for patient's symptoms. Lab work on 10.24.22 was without abnormalities.   -Trial Bentyl prescription, encouraged to follow-up with gastroenterologist.  Review of prior notes with me- started working out last week on a rowing machine prior to May 01, 2021 visit. Pain started in the lower back and has now radiated around the right side under her ribs. workED with trainer- rode 1000 meters. Felt like a tweak at first but by that night could not move in bed. Continued to worsen then wrapped around to front side of ribs/just under the ribs. 8 hour tylenol helps sometimes and sometimes not. Mild nausea. No constipation. No fevers. Did not worsened with eating. Laying down worsened the pain especially fat on back. Slept on left side. Pain at the moment 3/10 but within last 48 hours up to peak 8/10 at its worst. Had not tried heat or ice around the time (baseline felt hot) no rash on that side- did have zostavax.    S/p cholecystectomy. Doesn't feel typical of prior gallbladder issuesno chest pain or shortness of breath. Slightly pleuritic nature to pain.  CBC, CMP, lipase and urine culture largely reassuring.  Ultrasound was initially planned but since she had emergency room visit and CT scan I am already with history of cholecystectomy completing this would likely not be helpful  Today reports this is keeping her up at night.  Not sleeping well feels poorly overall. Dr. Jana Hakim was able to get MRI lumbar and thoracic spineapproved for next week- scheduled on 10th. Sees GI on the 9th.  A/P:has appropriate follow up already set up- I think the  MRI is a great idea- possible nerve related pain- thankful for Dr. Starleen Arms input. Would still follow up with GI- hoping they can set up regular colonoscopy possible endoscopy (which could cancel if cause found on MRI)  - for sleep  and pain was started on gabapentin 314m.  - will trial tramadol at bedtime alongside this- if any weakness/disorientation at night would do only one or the other -neuropathy in feet worse in last 6-8 months- this also makes me thankful for updating MRI to make sure not related to the back- pretty equal numbness and some pain into toes- shooting pain  #hypertension S: medication: Lisinopril-HCTZ 20-25 mg daily  BP Readings from Last 3 Encounters:  05/16/21 130/84  05/06/21 133/62  05/05/21 120/66  A/P: Controlled. Continue current medications.    #hyperlipidemia S: Medication:Crestor 10 mg twice a week    Lab Results  Component Value Date   CHOL 175 05/06/2020   HDL 46 (L) 05/06/2020   LDLCALC 103 (H) 05/06/2020   LDLDIRECT 84.0 11/04/2020   TRIG 160 (H) 05/06/2020   CHOLHDL 3.8 05/06/2020   A/P: update lipid panel- hoping LDL still at least under 100  #hypothyroidism S: compliant On thyroid medication-Levothyroxine 112 mg daily  Lab Results  Component Value Date   TSH 1.23 11/04/2020   A/P:hopefully stable- update tsh today. Continue current meds for now   # fatty liver- mild lft elevations- imaging with only fatty liver noted on last check-continue to work on lifestyle changes  #Left toe pain- bunion noted- offered podiatry referral- she wants to hold until 6 month visit  Recommended follow up: Return in about 6 months (around 11/13/2021) for follow-up or sooner if needed. Future Appointments  Date Time Provider [place]  05/21/2021 10:00 AM LLevin Erp [facility]  05/22/2021  4:00 PM [facility]-MR 1 [facility] [place]  05/22/2021  5:00 PM [facility]-MR 1 [facility] [place]  05/27/2021 12:30 PM Magrinat, GVirgie Dad MD [facility] None  05/19/2022  9:00 AM [facility] LAB [facility] None  05/19/2022  9:30 AM GNicholas Lose MD [facility] None   Lab/Order associations: NOT fasting- coffee with cream and sugar   ICD-10-CM   1. Preventative health care  Z00.00     2. Acute right-sided thoracic back pain  M54.6     3. Hyperlipidemia, unspecified hyperlipidemia type  E78.5     4. Essential hypertension  I10     5. Hypothyroidism due to acquired atrophy of thyroid  E03.4     6. Need for immunization against influenza  Z23       No orders of the defined types were placed in this encounter.  I,Jada Bradford,acting as a scribe for SGarret Reddish MD.,have documented all relevant documentation on the behalf of SGarret Reddish MD,as directed by  SGarret Reddish MD while in the presence of SGarret Reddish MD.  I, SGarret Reddish MD, have reviewed all documentation for this visit. The documentation on 05/16/21 for the exam, diagnosis, procedures, and orders are all accurate and complete.   Return precautions advised.  SGarret Reddish MD

## 2021-05-15 ENCOUNTER — Telehealth: Payer: Self-pay

## 2021-05-15 DIAGNOSIS — Z170 Estrogen receptor positive status [ER+]: Secondary | ICD-10-CM

## 2021-05-15 DIAGNOSIS — C50411 Malignant neoplasm of upper-outer quadrant of right female breast: Secondary | ICD-10-CM

## 2021-05-15 NOTE — Telephone Encounter (Signed)
Pt called and states she has a wound to her right axilla that "could be a burn from a heating pad." I called pt to offer an appt with [facility] and pt states she is currently seeking urgent care. Pt will call with any further concerns.

## 2021-05-15 NOTE — Telephone Encounter (Signed)
Pt called and c/o back pain not improving. Per Dr magrinat's last note, we will order MRI to further evaluate pain. Pt understands we will get this ordered, obtain PA and call her for an appt. Pt verbalized thanks. Orders placed and contacted PA team.

## 2021-05-16 ENCOUNTER — Encounter: Payer: Self-pay | Admitting: Family Medicine

## 2021-05-16 ENCOUNTER — Other Ambulatory Visit: Payer: Self-pay

## 2021-05-16 ENCOUNTER — Ambulatory Visit (INDEPENDENT_AMBULATORY_CARE_PROVIDER_SITE_OTHER): Payer: Medicare HMO | Admitting: Family Medicine

## 2021-05-16 ENCOUNTER — Telehealth: Payer: Self-pay | Admitting: *Deleted

## 2021-05-16 VITALS — BP 130/84 | HR 66 | Temp 97.9°F | Ht 65.0 in | Wt 204.0 lb

## 2021-05-16 DIAGNOSIS — I10 Essential (primary) hypertension: Secondary | ICD-10-CM

## 2021-05-16 DIAGNOSIS — M546 Pain in thoracic spine: Secondary | ICD-10-CM

## 2021-05-16 DIAGNOSIS — Z23 Encounter for immunization: Secondary | ICD-10-CM | POA: Diagnosis not present

## 2021-05-16 DIAGNOSIS — E034 Atrophy of thyroid (acquired): Secondary | ICD-10-CM | POA: Diagnosis not present

## 2021-05-16 DIAGNOSIS — E785 Hyperlipidemia, unspecified: Secondary | ICD-10-CM | POA: Diagnosis not present

## 2021-05-16 DIAGNOSIS — Z0000 Encounter for general adult medical examination without abnormal findings: Secondary | ICD-10-CM

## 2021-05-16 MED ORDER — TRAMADOL HCL 50 MG PO TABS: 50.0000 mg | ORAL_TABLET | Freq: Three times a day (TID) | ORAL | 0 refills | Status: AC | PRN

## 2021-05-16 NOTE — Telephone Encounter (Signed)
RN placed call to pt regarding appt schedule for upcoming MRI's.  Pt verbalized understanding of appt date and time.

## 2021-05-16 NOTE — Patient Instructions (Addendum)
Health Maintenance Due  Topic Date Due   Zoster Vaccines- Shingrix (1 of 2)-   - Please consider shingles shot at your local pharmacy when you feel better.   -Please send [place] the date of your shot so we can put it in our system.   Never done   COLONOSCOPY (Pts 45-39yr Insurance coverage will need to be confirmed) -   Please call to schedule:  -[facility] contact Address: [address] [place] [id number]: [phone] 05/27/2020   COVID-19 Vaccine (4 - Booster for [employer]oca-Col[employer]ies) -   - Please consider covid booster shot at your local pharmacy when you feel better.   -Please send [place] date of your shot so we can put it in our system.  06/28/2020   INFLUENZA VACCINE -   Please let [place] when you need this or call back for nurse visit 02/10/2021   Please trial Tramadol at bedtime for your pain and continue taking Gabapentin at bedtime for your sleep/pain. If you feel off with this combo may choose just 1   Please stop by lab before you go If you have mychart- we will send your results within 3 business days of [place] them.  If you do not have mychart- we will call you about results within 5 business days of [place] them.  *please also note that you will see labs on mychart as soon as they post. I will later go in and write notes on them- will say "notes from Dr. HYong Channel  I hope you feel better! Please update me on any new or worsening symptoms.   Recommended follow up: Return in about 6 months (around 11/13/2021) for follow-up or sooner if needed.  Hi Jade chart please

## 2021-05-21 ENCOUNTER — Ambulatory Visit: Payer: Medicare HMO | Admitting: Physician Assistant

## 2021-05-22 ENCOUNTER — Ambulatory Visit (HOSPITAL_COMMUNITY)
Admission: RE | Admit: 2021-05-22 | Discharge: 2021-05-22 | Disposition: A | Discharge status: Home / Self Care | Payer: Medicare HMO | Source: Ambulatory Visit | Attending: Oncology | Admitting: Oncology

## 2021-05-22 ENCOUNTER — Other Ambulatory Visit: Payer: Self-pay

## 2021-05-22 DIAGNOSIS — M47814 Spondylosis without myelopathy or radiculopathy, thoracic region: Secondary | ICD-10-CM | POA: Diagnosis not present

## 2021-05-22 DIAGNOSIS — Z170 Estrogen receptor positive status [ER+]: Secondary | ICD-10-CM | POA: Insufficient documentation

## 2021-05-22 DIAGNOSIS — C50411 Malignant neoplasm of upper-outer quadrant of right female breast: Secondary | ICD-10-CM | POA: Insufficient documentation

## 2021-05-22 DIAGNOSIS — M5126 Other intervertebral disc displacement, lumbar region: Secondary | ICD-10-CM | POA: Diagnosis not present

## 2021-05-22 DIAGNOSIS — M47816 Spondylosis without myelopathy or radiculopathy, lumbar region: Secondary | ICD-10-CM | POA: Diagnosis not present

## 2021-05-22 MED ORDER — GADOBUTROL 1 MMOL/ML IV SOLN
10.0000 mL | Freq: Once | INTRAVENOUS | Status: AC | PRN
  Administered 2021-05-22: 10 mL via INTRAVENOUS

## 2021-05-26 ENCOUNTER — Encounter: Payer: Self-pay | Admitting: Oncology

## 2021-05-26 NOTE — Progress Notes (Signed)
[facility]  [phone] [phone]    ID: Christine Leon DOB: September 18, 1950  MR#: [id number]  RWC#:[id number]  Patient Care Team: Marin Olp, MD as PCP - General (Family Medicine) Rolm Bookbinder, MD as Consulting Physician (General Surgery) Jorel Gravlin, Virgie Dad, MD as Consulting Physician (Oncology) Eppie Gibson, MD as Attending Physician (Radiation Oncology) Armbruster, Carlota Raspberry, MD as Consulting Physician (Gastroenterology) Madelin Rear, [facility] as Pharmacist (Pharmacist) OTHER MD:  I connected with Yohana Bartha Galligan on 05/27/21 at 12:30 PM EST by video enabled telemedicine visit and verified that I am speaking with the correct person using two identifiers.   I discussed the limitations, risks, security and privacy concerns of performing an evaluation and management service by telemedicine and the availability of in-person appointments. I also discussed with the patient that there may be a patient responsible charge related to this service. The patient expressed understanding and agreed to proceed.   Other persons participating in the visit and their role in the encounter: Home  Patient's location: None Provider's location: [facility]   I provided 15 minutes of face-to-face video visit time during this encounter, and > 50% was spent counseling as documented under my assessment & plan.   CHIEF COMPLAINT: Estrogen receptor positive breast cancer  CURRENT TREATMENT: anastrozole   INTERVAL HISTORY: [name] was contacted today for follow-up of her estrogen receptor positive breast cancer.   She continues on anastrozole.  She does have some hot flashes but gabapentin is helping and she feels there will be no problem continuing on this medication.  Her most recent bone density screening from 04/07/2019 showed a T-score of 0.0, which is considered normal.  Since her last visit, she underwent thoracic and lumbar spine MRI on  05/22/2021 showing: no evidence of metastatic disease; hemangiomas in T9 and T12 vertebral bodies; degenerative changes, mild in thoracic and suggested active osteoarthritis L3-S1; tiny right subarticular disc protrusion at L5-S1 abutting traversing right S1 nerve root.   REVIEW OF SYSTEMS: Detailed review of systems today was otherwise stable   COVID 19 VACCINATION STATUS: [facility] x3, last 04/2020   HISTORY OF CURRENT ILLNESS: From the original intake note:  Christine Leon "Christine Leon" had previous mammography in 2016 showing distortion of the right breast with benign findings along with a 67-monthfollow up. She did not have further mammography until she had bilateral diagnostic mammography with tomography and right breast [facility] showing: breast density category B. There was a suspicious mass in the right breast upper outer quadrant at 12 o'clock measuring 0.5 x 0.4 x 0.4 cm. There were multiple adjacent cysts. There was no sonographic evidence of abnormal lymph nodes or adenopathy.   Accordingly on 03/30/2018 she proceeded to biopsy of the right breast area in question. The pathology from this procedure showed (([id number]: Invasive ductal carcinoma, grade I. Prognostic indicators significant for: estrogen receptor, 100% positive and progesterone receptor, 80% positive, both with string staining intensity. Proliferation marker Ki67 at 3%. HER2 negative with an immunohistochemistry of (1+).  The patient's subsequent history is as detailed below.   PAST MEDICAL HISTORY: Past Medical History:  Diagnosis Date   Allergy    Celiac sprue    History of radiation therapy 05/26/18- 06/29/18   Right Breast 15 fractions for a total dose of 40.05 Gy. Right Breast boost 5 fractions for a total dose of 10 Gy   Hypertension    Hypothyroidism    Personal history of radiation  therapy 2019   PONV (postoperative nausea and vomiting)     PAST SURGICAL  HISTORY: Past Surgical History:  Procedure Laterality Date   ABDOMINAL HYSTERECTOMY     in 33s. fibroids. including cervix removal. per patient was told no further pap smears after.    arthroscopic knee surgery     2015 under Dr. Theda Sers   BREAST BIOPSY Right 04/01/2018   BREAST LUMPECTOMY Right 2019   BREAST LUMPECTOMY WITH RADIOACTIVE SEED AND SENTINEL LYMPH NODE BIOPSY Right 04/27/2018   Procedure: RIGHT BREAST LUMPECTOMY WITH RADIOACTIVE SEED AND RIGHT AXILLARYSENTINEL LYMPH NODE BIOPSY ERAS PATHWAY;  Surgeon: Rolm Bookbinder, MD;  Location: [facility];  Service: General;  Laterality: Right;  PECTORAL BLOCK   CHOLECYSTECTOMY     COCCYX REMOVAL     after cheerleading accident   OOPHORECTOMY     in 7s   TONSILLECTOMY    Hysterectomy with BSO (fibroids)   FAMILY HISTORY Family History  Problem Relation Age of Onset   Hypertension Mother    Coronary artery disease Father        63   COPD Father        passed age 56   Pulmonary fibrosis Father    Breast cancer Maternal Aunt    Pulmonary fibrosis Paternal Aunt    Stomach cancer Cousin        Paternal   Colon cancer Neg Hx    Esophageal cancer Neg Hx    Pancreatic cancer Neg Hx    Liver disease Neg Hx   The patient's father died at age 58 due to COPD and heart valve issues. The patient's mother is alive at age 4. The patient had 1 brother (deceased) and no sisters. There was a maternal aunt who was diagnosed with breast cancer in the 63's. The patient' denies any other family members with breast, ovarian, or other cancers.  The patient does not meet criteria for genetics testing   GYNECOLOGIC HISTORY:  No LMP recorded (lmp unknown). Patient has had a hysterectomy. Menarche: 48.70 years old Age at first live birth: 70 years old  She is GX P2. She is status post total hysterectomy with BSO in 1995. Her LMP was in her late 95's. She took HRT for a few years and recently discontinued.     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 Her husband, [name], is retired from working in Scientist, physiological for [employer]. [name] recently suffered from a medical incident and Christine Leon has been caring for him. The patient's oldest, a son named [name], lives in [place] and works as a 5th Land. The patient's youngest, a daughter named [name], lives in [place] and works as a Print production planner. The patient has 4 grandchildren. The patient is not a church attender   ADVANCED DIRECTIVES: Her husband recently suffered a medical incident, so she plans on naming her son, Ailea Rhatigan, and her daughter as well, as Medical Powers of Attorney.     HEALTH MAINTENANCE: Social History   Tobacco Use   Smoking status: Former    Packs/day: 1.00    Years: 39.00    Pack years: 39.00    Types: Cigarettes    Quit date: 07/13/2006    Years since quitting: 14.8   Smokeless tobacco: Never  Vaping Use   Vaping Use: Never used  Substance Use Topics   Alcohol use: Yes    Comment: socially   Drug use: No  Colonoscopy: 2018/ Dr. Havery Moros (repeat in 2 years)  PAP:  Bone density: scheduled for 2019   Allergies  Allergen Reactions   Gluten Meal     Has celic   Wheat Bran     Has celic    Cephalexin Hives    Current Outpatient Medications  Medication Sig Dispense Refill   ALPHA LIPOIC ACID PO Take 1 tablet by mouth daily.     anastrozole (ARIMIDEX) 1 MG tablet Take 1 tablet (1 mg total) by mouth daily. 90 tablet 4   Cholecalciferol (VITAMIN D-3 PO) Take 1,500 Units by mouth daily.     dicyclomine (BENTYL) 20 MG tablet Take 1 tablet (20 mg total) by mouth 2 (two) times daily. 20 tablet 0   fish oil-omega-3 fatty acids 1000 MG capsule Take 1 g by mouth daily.     fluticasone (FLONASE) 50 MCG/ACT nasal spray Place 2 sprays into both nostrils daily. 16 g 6   gabapentin (NEURONTIN) 300 MG capsule TAKE 1 CAPSULE BY MOUTH EVERY DAY AT BEDTIME 90 capsule 4    glucosamine-chondroitin 500-400 MG tablet Take 1 tablet by mouth daily.     levothyroxine (SYNTHROID) 112 MCG tablet TAKE 1 TABLET BY MOUTH EVERY DAY 90 tablet 3   lisinopril-hydrochlorothiazide (ZESTORETIC) 20-25 MG tablet TAKE 1 TABLET BY MOUTH EVERY DAY 90 tablet 3   rosuvastatin (CRESTOR) 10 MG tablet Take 1 tablet (10 mg total) by mouth 2 (two) times a week. 26 tablet 3   vitamin B-12 (CYANOCOBALAMIN) 500 MCG tablet Take 500 mcg by mouth daily.     No current facility-administered medications for this visit.    OBJECTIVE: White woman who appears stated age  There were no vitals filed for this visit.     There is no height or weight on file to calculate BMI.   Wt Readings from Last 3 Encounters:  05/16/21 204 lb (92.5 kg)  05/06/21 202 lb 9.6 oz (91.9 kg)  05/05/21 200 lb (90.7 kg)      ECOG FS:1 - Symptomatic but completely ambulatory  Telemedicine visit 05/27/2021  LAB RESULTS:  CMP     Component Value Date/Time   NA 141 05/06/2021 0902   K 3.8 05/06/2021 0902   CL 106 05/06/2021 0902   CO2 25 05/06/2021 0902   GLUCOSE 122 (H) 05/06/2021 0902   BUN 13 05/06/2021 0902   CREATININE 0.88 05/06/2021 0902   CREATININE 0.79 05/02/2021 1528   CALCIUM 9.5 05/06/2021 0902   PROT 6.8 05/06/2021 0902   ALBUMIN 3.9 05/06/2021 0902   AST 42 (H) 05/06/2021 0902   ALT 64 (H) 05/06/2021 0902   ALKPHOS 74 05/06/2021 0902   BILITOT 0.6 05/06/2021 0902   GFRNONAA >60 05/06/2021 0902   GFRNONAA 87 05/06/2020 1140   GFRAA 101 05/06/2020 1140    Lab Results  Component Value Date   WBC 4.9 05/06/2021   NEUTROABS 2.3 05/06/2021   HGB 13.9 05/06/2021   HCT 40.1 05/06/2021   MCV 87.6 05/06/2021   PLT 223 05/06/2021   No results found for: LABCA2  No components found for: TMHDQQ229  No results for input(s): INR in the last 168 hours.  No results found for: LABCA2  No results found for: NLG921  No results found for: JHE174  No results found for: YCX448  No results  found for: CA2729  No components found for: HGQUANT  No results found for: CEA1 / No results found for: CEA1   No results found for: AFPTUMOR  No  results found for: CHROMOGRNA  No results found for: TOTALPROTELP, ALBUMINELP, A1GS, A2GS, BETS, BETA2SER, GAMS, MSPIKE, SPEI (this displays SPEP labs)  No results found for: KPAFRELGTCHN, LAMBDASER, KAPLAMBRATIO (kappa/lambda light chains)  No results found for: HGBA, HGBA2QUANT, HGBFQUANT, HGBSQUAN (Hemoglobinopathy evaluation)   No results found for: LDH  Lab Results  Component Value Date   IRON 69 04/24/2020   IRONPCTSAT 20.4 04/24/2020   (Iron and TIBC)  Lab Results  Component Value Date   FERRITIN 256.0 04/24/2020    Urinalysis    Component Value Date/Time   COLORURINE YELLOW 12/27/2016 0626   APPEARANCEUR CLEAR 12/27/2016 0626   LABSPEC 1.006 12/27/2016 0626   PHURINE 7.0 12/27/2016 0626   GLUCOSEU NEGATIVE 12/27/2016 0626   HGBUR NEGATIVE 12/27/2016 0626   BILIRUBINUR neg 05/01/2021 1031   KETONESUR NEGATIVE 12/27/2016 0626   PROTEINUR Negative 05/01/2021 1031   PROTEINUR NEGATIVE 12/27/2016 0626   UROBILINOGEN 0.2 05/01/2021 1031   NITRITE neg 05/01/2021 1031   NITRITE NEGATIVE 12/27/2016 0626   LEUKOCYTESUR Trace (A) 05/01/2021 1031    STUDIES: MR THORACIC SPINE W WO CONTRAST  Result Date: 05/23/2021 CLINICAL DATA:  Malignant neoplasm of upper-outer quadrant of right breast in female, estrogen receptor positive ([place]). Low back pain, > 6 wks. EXAM: MRI THORACIC AND LUMBAR SPINE WITHOUT AND WITH CONTRAST TECHNIQUE: Multiplanar and multiecho pulse sequences of the thoracic and lumbar spine were obtained without and with intravenous contrast. CONTRAST:  42m GADAVIST GADOBUTROL 1 MMOL/ML IV SOLN COMPARISON:  None. FINDINGS: MRI THORACIC SPINE FINDINGS Alignment:  Physiologic. Vertebrae: No fracture, evidence of discitis, or aggressive bone lesion. Hemangioma at the T9 and T12 vertebral bodies. Cord:  Normal  signal and morphology. Paraspinal and other soft tissues: Negative. Disc levels: Small disc bulges from T1-2 through T7-8 causing minimal indentation on the thecal sac without significant spinal canal or neural foraminal stenosis. Right posterolateral disc protrusion at T8-9, causing small indentation of the thecal sac without significant spinal canal neural foraminal stenosis. Right fossa lateral disc protrusion and facet degenerative changes resulting in mild right neural foraminal narrowing. No significant spinal canal stenosis. No significant spinal canal or neural foraminal stenosis at T10-11, T11-12 or T12-L1. MRI LUMBAR SPINE FINDINGS Segmentation:  Standard. Alignment:  Small retrolisthesis of L2 over L3. Vertebrae: No fracture, evidence of discitis, or bone lesion. Endplate degenerative changes at L2-3. Sclerotic changes in the visualized portions of the left sacroiliac joint. No focus of abnormal contrast enhancement. Conus medullaris: Extends to the T12 level and appears normal. Paraspinal and other soft tissues: Negative. Disc levels: L1-2: Shallow disc bulge and mild facet degenerative changes. No spinal canal or neural foraminal stenosis. L2-3: Loss of disc height, disc bulge, mild facet degenerative changes with bilateral joint effusion. Findings result in mild spinal canal stenosis and mild bilateral neural foraminal narrowing. L3-4: Shallow disc bulge and moderate hypertrophic facet degenerative changes with mild periarticular contrast enhancement suggesting active osteoarthritis. There is also contrast enhancement of the inter spinous ligament. Mild spinal canal stenosis. No significant neural foraminal narrowing. L4-5: Shallow disc bulge and moderate hypertrophic facet degenerative changes with mild periarticular contrast enhancement suggesting active osteoarthritis. Mild spinal canal stenosis. No significant neural foraminal narrowing. L5-S1: Small right subarticular disc protrusion abutting the  traversing right S1 nerve root without significant spinal canal stenosis. Advanced facet degenerative changes, right greater than left with associated mild periarticular contrast enhancement suggesting active osteoarthritis. No neural foraminal narrowing. IMPRESSION: 1. No evidence of metastatic lesion to the thoracic or lumbar spine.  2. Hemangiomas in the T9 and T12 vertebral bodies. 3. Mild degenerative changes of the thoracic spine without high-grade spinal canal or neural foraminal stenosis at any level. 4. Degenerative changes of the lumbar spine, more pronounced at the level of the facet joints with mild periarticular contrast enhancement from L3-4 through L5-S1, suggesting active osteoarthritis. 5. Tiny right subarticular disc protrusion at L5-S1 abutting the traversing right S1 nerve root. 6. No high-grade spinal canal or foraminal stenosis of the lumbar spine. Electronically Signed   By: Pedro Earls M.D.   On: 05/23/2021 11:26   MR Lumbar Spine W Wo Contrast  Result Date: 05/23/2021 CLINICAL DATA:  Malignant neoplasm of upper-outer quadrant of right breast in female, estrogen receptor positive ([place]). Low back pain, > 6 wks. EXAM: MRI THORACIC AND LUMBAR SPINE WITHOUT AND WITH CONTRAST TECHNIQUE: Multiplanar and multiecho pulse sequences of the thoracic and lumbar spine were obtained without and with intravenous contrast. CONTRAST:  76m GADAVIST GADOBUTROL 1 MMOL/ML IV SOLN COMPARISON:  None. FINDINGS: MRI THORACIC SPINE FINDINGS Alignment:  Physiologic. Vertebrae: No fracture, evidence of discitis, or aggressive bone lesion. Hemangioma at the T9 and T12 vertebral bodies. Cord:  Normal signal and morphology. Paraspinal and other soft tissues: Negative. Disc levels: Small disc bulges from T1-2 through T7-8 causing minimal indentation on the thecal sac without significant spinal canal or neural foraminal stenosis. Right posterolateral disc protrusion at T8-9, causing small indentation  of the thecal sac without significant spinal canal neural foraminal stenosis. Right fossa lateral disc protrusion and facet degenerative changes resulting in mild right neural foraminal narrowing. No significant spinal canal stenosis. No significant spinal canal or neural foraminal stenosis at T10-11, T11-12 or T12-L1. MRI LUMBAR SPINE FINDINGS Segmentation:  Standard. Alignment:  Small retrolisthesis of L2 over L3. Vertebrae: No fracture, evidence of discitis, or bone lesion. Endplate degenerative changes at L2-3. Sclerotic changes in the visualized portions of the left sacroiliac joint. No focus of abnormal contrast enhancement. Conus medullaris: Extends to the T12 level and appears normal. Paraspinal and other soft tissues: Negative. Disc levels: L1-2: Shallow disc bulge and mild facet degenerative changes. No spinal canal or neural foraminal stenosis. L2-3: Loss of disc height, disc bulge, mild facet degenerative changes with bilateral joint effusion. Findings result in mild spinal canal stenosis and mild bilateral neural foraminal narrowing. L3-4: Shallow disc bulge and moderate hypertrophic facet degenerative changes with mild periarticular contrast enhancement suggesting active osteoarthritis. There is also contrast enhancement of the inter spinous ligament. Mild spinal canal stenosis. No significant neural foraminal narrowing. L4-5: Shallow disc bulge and moderate hypertrophic facet degenerative changes with mild periarticular contrast enhancement suggesting active osteoarthritis. Mild spinal canal stenosis. No significant neural foraminal narrowing. L5-S1: Small right subarticular disc protrusion abutting the traversing right S1 nerve root without significant spinal canal stenosis. Advanced facet degenerative changes, right greater than left with associated mild periarticular contrast enhancement suggesting active osteoarthritis. No neural foraminal narrowing. IMPRESSION: 1. No evidence of metastatic lesion  to the thoracic or lumbar spine. 2. Hemangiomas in the T9 and T12 vertebral bodies. 3. Mild degenerative changes of the thoracic spine without high-grade spinal canal or neural foraminal stenosis at any level. 4. Degenerative changes of the lumbar spine, more pronounced at the level of the facet joints with mild periarticular contrast enhancement from L3-4 through L5-S1, suggesting active osteoarthritis. 5. Tiny right subarticular disc protrusion at L5-S1 abutting the traversing right S1 nerve root. 6. No high-grade spinal canal or foraminal stenosis of the lumbar spine.  Electronically Signed   By: Pedro Earls M.D.   On: 05/23/2021 11:26   CT ABDOMEN PELVIS W CONTRAST  Result Date: 05/05/2021 CLINICAL DATA:  Epigastric abdominal pain. EXAM: CT ABDOMEN AND PELVIS WITH CONTRAST TECHNIQUE: Multidetector CT imaging of the abdomen and pelvis was performed using the standard protocol following bolus administration of intravenous contrast. CONTRAST:  148m OMNIPAQUE IOHEXOL 300 MG/ML  SOLN COMPARISON:  CT December 27, 2016 FINDINGS: Lower chest: No acute abnormality. Hepatobiliary: Diffuse hepatic steatosis. No suspicious hepatic lesion. Gallbladder surgically absent. No biliary ductal dilation. Pancreas: No pancreatic ductal dilation or evidence of acute inflammation. Spleen: Spines within normal limits. Adrenals/Urinary Tract: Bilateral adrenal glands are unremarkable. No hydronephrosis. No solid enhancing renal mass. Urinary bladder is unremarkable for degree of distension. Stomach/Bowel: No enteric contrast material was administered. Stomach is unremarkable for degree of distension. No pathologic dilation of small bowel. The appendix and terminal appear normal. Colonic diverticulosis without findings of acute diverticulitis. Vascular/Lymphatic: Aortic and branch vessel atherosclerosis without abdominal aortic aneurysm. No pathologically enlarged lymph nodes in the abdomen or pelvis. Prominent  periportal lymph nodes measuring up to 7 mm on image 26/2 stable dating back to CT December 27, 2016 and favored reactive. Reproductive: Status post hysterectomy. No adnexal masses. Other: No abdominopelvic ascites.  No pneumoperitoneum. Musculoskeletal: No acute or significant osseous findings. Multilevel degenerative changes spine. Degenerative changes bilateral hips and SI joints. Chronic changes at the pubic symphysis of osteitis pubis. IMPRESSION: 1. No acute findings in the abdomen or pelvis. 2. Colonic diverticulosis without findings of acute diverticulitis. 3. Hepatic steatosis. 4. Aortic Atherosclerosis (ICD10-I70.0). Electronically Signed   By: JDahlia BailiffM.D.   On: 05/05/2021 12:59      ELIGIBLE FOR AVAILABLE RESEARCH PROTOCOL: no   ASSESSMENT: 70y.o. [place] status post right breast upper outer quadrant biopsy 03/30/2018 for a clinical T1a N0, stage IA invasive ductal carcinoma, grade 1, estrogen and progesterone receptor positive, HER-2 not amplified, with an MIB-1 of 3%.  (1) status post right lumpectomy and sentinel lymph node sampling 04/27/2018 for a pT1c pN0, stage IA invasive ductal carcinoma, grade 1, with negative margins  (a) 1 sentinel lymph node was removed  (2) Oncotype DX score of 17 predicts a risk of recurrence outside the breast over 9 years of 5% if the patient's only systemic therapy is an antiestrogen for 5 years.  It also predicts no benefit from chemotherapy  (3) adjuvant radiation completed 06/28/2018  (4) started tamoxifen 07/27/2017--discontinued secondary to rash  (5) anastrozole started 08/31/2018  (a) bone density 04/07/2019 showed a T score of 0.0 (normal).   PLAN: RDebroah Balleris now 3 years out from definitive surgery for her breast cancer with no evidence of disease recurrence.  This is very favorable.  She is tolerating anastrozole well and the plan is to continue that a total of 5 years.  Her back pain is not related to her cancer.  She  has an appointment pending with neurosurgery.  Likely she will be treated symptomatically through their pain clinic.  She will see [place] in 1 year.  She knows to call for any other issue that may develop before then.  Total encounter time 15 minutes.*   Macyn Remmert, GVirgie Dad MD  05/27/21 7:32 PM Medical Oncology and Hematology [facility] LaneADickson [place] [address]Tel. [phone]   Fax. [phone]  I, KWilburn Mylar am acting as scribe for Dr. GVirgie Dad Lynx Goodrich.  I, Lurline Del MD, have reviewed the above documentation for accuracy and completeness, and I agree with the above.   *Total Encounter Time as defined by the Centers for Medicare and Medicaid Services includes, in addition to the face-to-face time of a patient visit (documented in the note above) non-face-to-face time: obtaining and reviewing outside history, ordering and reviewing medications, tests or procedures, care coordination (communications with other health care professionals or caregivers) and documentation in the medical record.

## 2021-05-27 ENCOUNTER — Other Ambulatory Visit: Payer: Self-pay

## 2021-05-27 ENCOUNTER — Inpatient Hospital Stay: Payer: Medicare HMO | Attending: Oncology | Admitting: Oncology

## 2021-05-27 ENCOUNTER — Other Ambulatory Visit: Payer: Self-pay | Admitting: *Deleted

## 2021-05-27 DIAGNOSIS — E039 Hypothyroidism, unspecified: Secondary | ICD-10-CM | POA: Insufficient documentation

## 2021-05-27 DIAGNOSIS — Z79811 Long term (current) use of aromatase inhibitors: Secondary | ICD-10-CM | POA: Diagnosis not present

## 2021-05-27 DIAGNOSIS — Z800 Family history of malignant neoplasm of digestive organs: Secondary | ICD-10-CM | POA: Diagnosis not present

## 2021-05-27 DIAGNOSIS — M4316 Spondylolisthesis, lumbar region: Secondary | ICD-10-CM | POA: Diagnosis not present

## 2021-05-27 DIAGNOSIS — Z90722 Acquired absence of ovaries, bilateral: Secondary | ICD-10-CM | POA: Diagnosis not present

## 2021-05-27 DIAGNOSIS — Z9049 Acquired absence of other specified parts of digestive tract: Secondary | ICD-10-CM | POA: Diagnosis not present

## 2021-05-27 DIAGNOSIS — R232 Flushing: Secondary | ICD-10-CM | POA: Insufficient documentation

## 2021-05-27 DIAGNOSIS — I10 Essential (primary) hypertension: Secondary | ICD-10-CM | POA: Diagnosis not present

## 2021-05-27 DIAGNOSIS — N6001 Solitary cyst of right breast: Secondary | ICD-10-CM | POA: Insufficient documentation

## 2021-05-27 DIAGNOSIS — Z881 Allergy status to other antibiotic agents status: Secondary | ICD-10-CM | POA: Diagnosis not present

## 2021-05-27 DIAGNOSIS — Z836 Family history of other diseases of the respiratory system: Secondary | ICD-10-CM | POA: Diagnosis not present

## 2021-05-27 DIAGNOSIS — M5124 Other intervertebral disc displacement, thoracic region: Secondary | ICD-10-CM | POA: Insufficient documentation

## 2021-05-27 DIAGNOSIS — M47814 Spondylosis without myelopathy or radiculopathy, thoracic region: Secondary | ICD-10-CM | POA: Insufficient documentation

## 2021-05-27 DIAGNOSIS — Z170 Estrogen receptor positive status [ER+]: Secondary | ICD-10-CM | POA: Diagnosis not present

## 2021-05-27 DIAGNOSIS — M47816 Spondylosis without myelopathy or radiculopathy, lumbar region: Secondary | ICD-10-CM | POA: Diagnosis not present

## 2021-05-27 DIAGNOSIS — K5730 Diverticulosis of large intestine without perforation or abscess without bleeding: Secondary | ICD-10-CM | POA: Insufficient documentation

## 2021-05-27 DIAGNOSIS — K760 Fatty (change of) liver, not elsewhere classified: Secondary | ICD-10-CM | POA: Diagnosis not present

## 2021-05-27 DIAGNOSIS — C50411 Malignant neoplasm of upper-outer quadrant of right female breast: Secondary | ICD-10-CM | POA: Diagnosis not present

## 2021-05-27 DIAGNOSIS — Z923 Personal history of irradiation: Secondary | ICD-10-CM | POA: Insufficient documentation

## 2021-05-27 DIAGNOSIS — Z803 Family history of malignant neoplasm of breast: Secondary | ICD-10-CM | POA: Insufficient documentation

## 2021-05-27 DIAGNOSIS — Z8249 Family history of ischemic heart disease and other diseases of the circulatory system: Secondary | ICD-10-CM | POA: Insufficient documentation

## 2021-05-27 DIAGNOSIS — Z79899 Other long term (current) drug therapy: Secondary | ICD-10-CM | POA: Insufficient documentation

## 2021-05-27 DIAGNOSIS — M5117 Intervertebral disc disorders with radiculopathy, lumbosacral region: Secondary | ICD-10-CM | POA: Insufficient documentation

## 2021-05-27 DIAGNOSIS — M549 Dorsalgia, unspecified: Secondary | ICD-10-CM

## 2021-05-27 DIAGNOSIS — R937 Abnormal findings on diagnostic imaging of other parts of musculoskeletal system: Secondary | ICD-10-CM

## 2021-05-28 DIAGNOSIS — M9908 Segmental and somatic dysfunction of rib cage: Secondary | ICD-10-CM | POA: Diagnosis not present

## 2021-05-28 DIAGNOSIS — M9902 Segmental and somatic dysfunction of thoracic region: Secondary | ICD-10-CM | POA: Diagnosis not present

## 2021-05-28 DIAGNOSIS — M9903 Segmental and somatic dysfunction of lumbar region: Secondary | ICD-10-CM | POA: Diagnosis not present

## 2021-05-28 DIAGNOSIS — M5136 Other intervertebral disc degeneration, lumbar region: Secondary | ICD-10-CM | POA: Diagnosis not present

## 2021-05-29 DIAGNOSIS — M9902 Segmental and somatic dysfunction of thoracic region: Secondary | ICD-10-CM | POA: Diagnosis not present

## 2021-05-29 DIAGNOSIS — M9908 Segmental and somatic dysfunction of rib cage: Secondary | ICD-10-CM | POA: Diagnosis not present

## 2021-05-29 DIAGNOSIS — M5136 Other intervertebral disc degeneration, lumbar region: Secondary | ICD-10-CM | POA: Diagnosis not present

## 2021-05-29 DIAGNOSIS — M9903 Segmental and somatic dysfunction of lumbar region: Secondary | ICD-10-CM | POA: Diagnosis not present

## 2021-06-02 DIAGNOSIS — M9902 Segmental and somatic dysfunction of thoracic region: Secondary | ICD-10-CM | POA: Diagnosis not present

## 2021-06-02 DIAGNOSIS — M9903 Segmental and somatic dysfunction of lumbar region: Secondary | ICD-10-CM | POA: Diagnosis not present

## 2021-06-02 DIAGNOSIS — M9908 Segmental and somatic dysfunction of rib cage: Secondary | ICD-10-CM | POA: Diagnosis not present

## 2021-06-02 DIAGNOSIS — M5136 Other intervertebral disc degeneration, lumbar region: Secondary | ICD-10-CM | POA: Diagnosis not present

## 2021-06-04 DIAGNOSIS — M5136 Other intervertebral disc degeneration, lumbar region: Secondary | ICD-10-CM | POA: Diagnosis not present

## 2021-06-04 DIAGNOSIS — M9908 Segmental and somatic dysfunction of rib cage: Secondary | ICD-10-CM | POA: Diagnosis not present

## 2021-06-04 DIAGNOSIS — M9902 Segmental and somatic dysfunction of thoracic region: Secondary | ICD-10-CM | POA: Diagnosis not present

## 2021-06-04 DIAGNOSIS — M9903 Segmental and somatic dysfunction of lumbar region: Secondary | ICD-10-CM | POA: Diagnosis not present

## 2021-06-09 DIAGNOSIS — M9908 Segmental and somatic dysfunction of rib cage: Secondary | ICD-10-CM | POA: Diagnosis not present

## 2021-06-09 DIAGNOSIS — M9902 Segmental and somatic dysfunction of thoracic region: Secondary | ICD-10-CM | POA: Diagnosis not present

## 2021-06-09 DIAGNOSIS — M5136 Other intervertebral disc degeneration, lumbar region: Secondary | ICD-10-CM | POA: Diagnosis not present

## 2021-06-09 DIAGNOSIS — M9903 Segmental and somatic dysfunction of lumbar region: Secondary | ICD-10-CM | POA: Diagnosis not present

## 2021-06-10 ENCOUNTER — Telehealth: Payer: Self-pay | Admitting: Family Medicine

## 2021-06-10 NOTE — Telephone Encounter (Signed)
Spoke with patient request a call back in January 2023

## 2021-06-22 DIAGNOSIS — N390 Urinary tract infection, site not specified: Secondary | ICD-10-CM | POA: Diagnosis not present

## 2021-07-02 ENCOUNTER — Telehealth: Payer: Self-pay | Admitting: Pharmacist

## 2021-07-02 NOTE — Progress Notes (Signed)
Chronic Care Management Pharmacy Assistant   Name: Tameko Halder [facility]  MRN: [id number] DOB: 01/02/1951  Reason for Encounter: Disease State - General Adherence Call    Recent office visits:  05/16/2021 Garret Reddish, MD (PCP) - [place] were ordered. TraMADol (ULTRAM) 50 MG tablet prescribed. Follow up as scheduled.   05/01/2021 Garret Reddish, MD (PCP) - RUQ pain - Labs were ordered. No medication changes. Follow up as scheduled.   Recent consult visits:  05/27/2021 Lurline Del, MD - Oncology - Malignant Neoplasm - No medication changes. Follow up in 1 year.   05/06/2021 Lurline Del, MD - Oncology - Malignant Neoplasm - gabapentin (NEURONTIN) 300 MG capsule prescribed. After much discussion we decided we would check again in 2 weeks and if the pain is still bothering her then we could consider MRI of the thoracolumbar spine at that time.   Hospital visits:  Medication Reconciliation was completed by comparing discharge summary, patients EMR and Pharmacy list, and upon discussion with patient.  Admitted to the hospital on 05/05/2021 due to Epigastric pain. Discharge date was 05/05/2021. /Discharged from [facility] .  New?Medications Started at [facility] Discharge:?? Dicyclomine (BENTYL) 20 MG tablet 1 tablet twice daily was prescribed.   Medication Changes at Hospital Discharge: None noted.   Medications Discontinued at Hospital Discharge: No medications were discontinued at discharge.   Medications that remain the same after Hospital Discharge:??  All other medications will remain the same.    Medications: Outpatient Encounter Medications as of 07/02/2021  Medication Sig   ALPHA LIPOIC ACID PO Take 1 tablet by mouth daily.   anastrozole (ARIMIDEX) 1 MG tablet Take 1 tablet (1 mg total) by mouth daily.   Cholecalciferol (VITAMIN D-3 PO) Take 1,500 Units by mouth daily.   dicyclomine (BENTYL) 20 MG tablet Take 1 tablet (20 mg  total) by mouth 2 (two) times daily.   fish oil-omega-3 fatty acids 1000 MG capsule Take 1 g by mouth daily.   fluticasone (FLONASE) 50 MCG/ACT nasal spray Place 2 sprays into both nostrils daily.   gabapentin (NEURONTIN) 300 MG capsule TAKE 1 CAPSULE BY MOUTH EVERY DAY AT BEDTIME   glucosamine-chondroitin 500-400 MG tablet Take 1 tablet by mouth daily.   levothyroxine (SYNTHROID) 112 MCG tablet TAKE 1 TABLET BY MOUTH EVERY DAY   lisinopril-hydrochlorothiazide (ZESTORETIC) 20-25 MG tablet TAKE 1 TABLET BY MOUTH EVERY DAY   rosuvastatin (CRESTOR) 10 MG tablet Take 1 tablet (10 mg total) by mouth 2 (two) times a week.   vitamin B-12 (CYANOCOBALAMIN) 500 MCG tablet Take 500 mcg by mouth daily.   No facility-administered encounter medications on file as of 07/02/2021.   Have you had any problems recently with your health? Patient denied any problems with her health recently.   Have you had any problems with your pharmacy? Patient denied any problems with her current pharmacy.   What issues or side effects are you having with your medications? Patient denied any side effects or issues with her current medication regimen.   What would you like me to pass along to Leata Mouse, CPP for them to help you with?  Patient did not have anything to pass along to CPP at this time.   What can we do to take care of you better? Patient did not have any recommendations at this time.   Care Gaps  AWV: unknown  Colonoscopy: done 05/27/17 DM Eye Exam: N/A DM Foot Exam:  N/A Microalbumin: N/A HbgAIC: N/A DEXA: done 04/07/2019  Mammogram: done 04/25/2021  Star Rating Drugs: lisinopril-hydrochlorothiazide (ZESTORETIC) 20-25 MG tablet - last filled 04/23/2021 90 days  rosuvastatin (CRESTOR) 10 MG tablet - last filled 05/20/2021 90 days   Future Appointments  Date Time Provider [place]  11/25/2021 10:00 AM Marin Olp, MD [facility]  05/19/2022  9:00 AM [facility] None   05/19/2022  9:30 AM Nicholas Lose, MD [facility] None  05/20/2022  9:40 AM Yong Channel, [name], MD [facility] [place], [facility] Clinical Pharmacist Assistant  [phone]

## 2021-07-11 ENCOUNTER — Ambulatory Visit (INDEPENDENT_AMBULATORY_CARE_PROVIDER_SITE_OTHER): Payer: Medicare HMO

## 2021-07-11 ENCOUNTER — Other Ambulatory Visit: Payer: Self-pay

## 2021-07-11 DIAGNOSIS — Z0000 Encounter for general adult medical examination without abnormal findings: Secondary | ICD-10-CM | POA: Diagnosis not present

## 2021-07-11 NOTE — Patient Instructions (Signed)
[name] , Thank you for taking time to come for your Medicare Wellness Visit. I appreciate your ongoing commitment to your health goals. Please review the following plan we discussed and let me know if I can assist you in the future.   Screening recommendations/referrals: Colonoscopy: Due and discussed pt will call Dr for appt  Mammogram: Done 04/25/21 repeat every  year  Bone Density: Done 04/07/19 repeat every 2 years  Recommended yearly ophthalmology/optometry visit for glaucoma screening and checkup Recommended yearly dental visit for hygiene and checkup  Vaccinations: Influenza vaccine: Due and dicussed  Pneumococcal vaccine: Up to date Tdap vaccine: Done 11/24/13 repeat every 10 years  Shingles vaccine: Shingrix discussed. Please contact your pharmacy for coverage information.    Covid-19:Completed 1/21, 2/9, 05/03/20   Advanced directives: Please bring a copy of your health care power of attorney and living will to the office at your convenience.  Conditions/risks identified: Lose weight   Next appointment: Follow up in one year for your annual wellness visit    Preventive Care 65 Years and Older, Female Preventive care refers to lifestyle choices and visits with your health care provider that can promote health and wellness. What does preventive care include? A yearly physical exam. This is also called an annual well check. Dental exams once or twice a year. Routine eye exams. Ask your health care provider how often you should have your eyes checked. Personal lifestyle choices, including: Daily care of your teeth and gums. Regular physical activity. Eating a healthy diet. Avoiding tobacco and drug use. Limiting alcohol use. Practicing safe sex. Taking low-dose aspirin every day. Taking vitamin and mineral supplements as recommended by your health care provider. What happens during an annual well check? The services and screenings done by your health care provider  during your annual well check will depend on your age, overall health, lifestyle risk factors, and family history of disease. Counseling  Your health care provider may ask you questions about your: Alcohol use. Tobacco use. Drug use. Emotional well-being. Home and relationship well-being. Sexual activity. Eating habits. History of falls. Memory and ability to understand (cognition). Work and work Statistician. Reproductive health. Screening  You may have the following tests or measurements: Height, weight, and BMI. Blood pressure. Lipid and cholesterol levels. These may be checked every 5 years, or more frequently if you are over 58 years old. Skin check. Lung cancer screening. You may have this screening every year starting at age 25 if you have a 30-pack-year history of smoking and currently smoke or have quit within the past 15 years. Fecal occult blood test (FOBT) of the stool. You may have this test every year starting at age 78. Flexible sigmoidoscopy or colonoscopy. You may have a sigmoidoscopy every 5 years or a colonoscopy every 10 years starting at age 68. Hepatitis C blood test. Hepatitis B blood test. Sexually transmitted disease (STD) testing. Diabetes screening. This is done by checking your blood sugar (glucose) after you have not eaten for a while (fasting). You may have this done every 1-3 years. Bone density scan. This is done to screen for osteoporosis. You may have this done starting at age 62. Mammogram. This may be done every 1-2 years. Talk to your health care provider about how often you should have regular mammograms. Talk with your health care provider about your test results, treatment options, and if necessary, the need for more tests. Vaccines  Your health care provider may recommend certain vaccines, such as: Influenza vaccine.  This is recommended every year. Tetanus, diphtheria, and acellular pertussis (Tdap, Td) vaccine. You may need a Td booster every  10 years. Zoster vaccine. You may need this after age 44. Pneumococcal 13-valent conjugate (PCV13) vaccine. One dose is recommended after age 43. Pneumococcal polysaccharide (PPSV23) vaccine. One dose is recommended after age 27. Talk to your health care provider about which screenings and vaccines you need and how often you need them. This information is not intended to replace advice given to you by your health care provider. Make sure you discuss any questions you have with your health care provider. Document Released: 07/26/2015 Document Revised: 03/18/2016 Document Reviewed: 04/30/2015 Elsevier Interactive Patient Education  2017 [place] Prevention in the Home Falls can cause injuries. They can happen to people of all ages. There are many things you can do to make your home safe and to help prevent falls. What can I do on the outside of my home? Regularly fix the edges of walkways and driveways and fix any cracks. Remove anything that might make you trip as you walk through a door, such as a raised step or threshold. Trim any bushes or trees on the path to your home. Use bright outdoor lighting. Clear any walking paths of anything that might make someone trip, such as rocks or tools. Regularly check to see if handrails are loose or broken. Make sure that both sides of any steps have handrails. Any raised decks and porches should have guardrails on the edges. Have any leaves, snow, or ice cleared regularly. Use sand or salt on walking paths during winter. Clean up any spills in your garage right away. This includes oil or grease spills. What can I do in the bathroom? Use night lights. Install grab bars by the toilet and in the tub and shower. Do not use towel bars as grab bars. Use non-skid mats or decals in the tub or shower. If you need to sit down in the shower, use a plastic, non-slip stool. Keep the floor dry. Clean up any water that spills on the floor as soon as it  happens. Remove soap buildup in the tub or shower regularly. Attach bath mats securely with double-sided non-slip rug tape. Do not have throw rugs and other things on the floor that can make you trip. What can I do in the bedroom? Use night lights. Make sure that you have a light by your bed that is easy to reach. Do not use any sheets or blankets that are too big for your bed. They should not hang down onto the floor. Have a firm chair that has side arms. You can use this for support while you get dressed. Do not have throw rugs and other things on the floor that can make you trip. What can I do in the kitchen? Clean up any spills right away. Avoid walking on wet floors. Keep items that you use a lot in easy-to-reach places. If you need to reach something above you, use a strong step stool that has a grab bar. Keep electrical cords out of the way. Do not use floor polish or wax that makes floors slippery. If you must use wax, use non-skid floor wax. Do not have throw rugs and other things on the floor that can make you trip. What can I do with my stairs? Do not leave any items on the stairs. Make sure that there are handrails on both sides of the stairs and use them. Fix handrails  that are broken or loose. Make sure that handrails are as long as the stairways. Check any carpeting to make sure that it is firmly attached to the stairs. Fix any carpet that is loose or worn. Avoid having throw rugs at the top or bottom of the stairs. If you do have throw rugs, attach them to the floor with carpet tape. Make sure that you have a light switch at the top of the stairs and the bottom of the stairs. If you do not have them, ask someone to add them for you. What else can I do to help prevent falls? Wear shoes that: Do not have high heels. Have rubber bottoms. Are comfortable and fit you well. Are closed at the toe. Do not wear sandals. If you use a stepladder: Make sure that it is fully opened.  Do not climb a closed stepladder. Make sure that both sides of the stepladder are locked into place. Ask someone to hold it for you, if possible. Clearly mark and make sure that you can see: Any grab bars or handrails. First and last steps. Where the edge of each step is. Use tools that help you move around (mobility aids) if they are needed. These include: Canes. Walkers. Scooters. Crutches. Turn on the lights when you go into a dark area. Replace any light bulbs as soon as they burn out. Set up your furniture so you have a clear path. Avoid moving your furniture around. If any of your floors are uneven, fix them. If there are any pets around you, be aware of where they are. Review your medicines with your doctor. Some medicines can make you feel dizzy. This can increase your chance of falling. Ask your doctor what other things that you can do to help prevent falls. This information is not intended to replace advice given to you by your health care provider. Make sure you discuss any questions you have with your health care provider. Document Released: 04/25/2009 Document Revised: 12/05/2015 Document Reviewed: 08/03/2014 Elsevier Interactive Patient Education  2017 [employer].

## 2021-07-11 NOTE — Progress Notes (Signed)
Virtual Visit via Telephone Note  I connected with  Christine Leon on 07/11/21 at  3:15 PM EST by telephone and verified that I am speaking with the correct person using two identifiers.  Medicare Annual Wellness visit completed telephonically due to Covid-19 pandemic.   Persons participating in this call: This Health Coach and this patient.   Location: Patient: Home Provider: Office    I discussed the limitations, risks, security and privacy concerns of performing an evaluation and management service by telephone and the availability of in person appointments. The patient expressed understanding and agreed to proceed.  Unable to perform video visit due to video visit attempted and failed and/or patient does not have video capability.   Some vital signs may be absent or patient reported.   Willette Brace, LPN   Subjective:   Christine Leon is a 70 y.o. female who presents for an Initial Medicare Annual Wellness Visit.  Review of Systems     Cardiac Risk Factors include: advanced age (>1mn, >>16women);hypertension;dyslipidemia;obesity (BMI >30kg/m2)     Objective:    There were no vitals filed for this visit. There is no height or weight on file to calculate BMI.  Advanced Directives 07/11/2021 05/05/2021 04/13/2019 08/03/2018 05/13/2018 05/11/2018 04/20/2018  Does Patient Have a Medical Advance Directive? Yes No Yes Yes Yes Yes Yes  Type of APrintmakerof ANew [place]Living will HC[place]Living will HT[place]Living will HT[place]Living will HNorth Cape MayLiving will  Copy of HHillmanin Chart? Yes - validated most recent copy scanned in chart (See row information) - - No - copy requested No - copy requested No - copy requested -  Would patient like information on creating a medical advance directive? - - - - No - Patient  declined No - Patient declined -    Current Medications (verified) Outpatient Encounter Medications as of 07/11/2021  Medication Sig   ALPHA LIPOIC ACID PO Take 1 tablet by mouth daily.   anastrozole (ARIMIDEX) 1 MG tablet Take 1 tablet (1 mg total) by mouth daily.   Cholecalciferol (VITAMIN D-3 PO) Take 1,500 Units by mouth daily.   dicyclomine (BENTYL) 20 MG tablet Take 1 tablet (20 mg total) by mouth 2 (two) times daily.   fish oil-omega-3 fatty acids 1000 MG capsule Take 1 g by mouth daily.   fluticasone (FLONASE) 50 MCG/ACT nasal spray Place 2 sprays into both nostrils daily.   gabapentin (NEURONTIN) 300 MG capsule TAKE 1 CAPSULE BY MOUTH EVERY DAY AT BEDTIME   glucosamine-chondroitin 500-400 MG tablet Take 1 tablet by mouth daily.   levothyroxine (SYNTHROID) 112 MCG tablet TAKE 1 TABLET BY MOUTH EVERY DAY   lisinopril-hydrochlorothiazide (ZESTORETIC) 20-25 MG tablet TAKE 1 TABLET BY MOUTH EVERY DAY   rosuvastatin (CRESTOR) 10 MG tablet Take 1 tablet (10 mg total) by mouth 2 (two) times a week.   vitamin B-12 (CYANOCOBALAMIN) 500 MCG tablet Take 500 mcg by mouth daily.   No facility-administered encounter medications on file as of 07/11/2021.    Allergies (verified) Gluten meal, Wheat bran, and Cephalexin   History: Past Medical History:  Diagnosis Date   Allergy    Celiac sprue    History of radiation therapy 05/26/18- 06/29/18   Right Breast 15 fractions for a total dose of 40.05 Gy. Right Breast boost 5 fractions for a total dose of 10 Gy   Hypertension  Hypothyroidism    Personal history of radiation therapy 2019   PONV (postoperative nausea and vomiting)    Past Surgical History:  Procedure Laterality Date   ABDOMINAL HYSTERECTOMY     in 49s. fibroids. including cervix removal. per patient was told no further pap smears after.    arthroscopic knee surgery     2015 under Dr. Theda Sers   BREAST BIOPSY Right 04/01/2018   BREAST LUMPECTOMY Right 2019   BREAST  LUMPECTOMY WITH RADIOACTIVE SEED AND SENTINEL LYMPH NODE BIOPSY Right 04/27/2018   Procedure: RIGHT BREAST LUMPECTOMY WITH RADIOACTIVE SEED AND RIGHT AXILLARYSENTINEL LYMPH NODE BIOPSY ERAS PATHWAY;  Surgeon: Rolm Bookbinder, MD;  Location: [facility];  Service: General;  Laterality: Right;  PECTORAL BLOCK   CHOLECYSTECTOMY     COCCYX REMOVAL     after cheerleading accident   OOPHORECTOMY     in 7s   TONSILLECTOMY     Family History  Problem Relation Age of Onset   Hypertension Mother    Coronary artery disease Father        6   COPD Father        passed age 35   Pulmonary fibrosis Father    Breast cancer Maternal Aunt    Pulmonary fibrosis Paternal Aunt    Stomach cancer Cousin        Paternal   Colon cancer Neg Hx    Esophageal cancer Neg Hx    Pancreatic cancer Neg Hx    Liver disease Neg Hx    Social History   Socioeconomic History   Marital status: Married    Spouse name: Not on file   Number of children: Not on file   Years of education: Not on file   Highest education level: Not on file  Occupational History   Not on file  Tobacco Use   Smoking status: Former    Packs/day: 1.00    Years: 39.00    Pack years: 39.00    Types: Cigarettes    Quit date: 07/13/2006    Years since quitting: 15.0   Smokeless tobacco: Never  Vaping Use   Vaping Use: Never used  Substance and Sexual Activity   Alcohol use: Yes    Comment: socially   Drug use: No   Sexual activity: Not on file  Other Topics Concern   Not on file  Social History Narrative   Married (husband patient of Dr. Yong Channel), 2 children, 1 grandchild   Her mom lives with her. Dad passed 2017- had lived with them.       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      Hobbies: Traveling, but very busy recent years caring for father who is ill   Social Determinants of Radio broadcast assistant Strain: Low Risk    Difficulty of Paying Living Expenses: Not hard at all   Food Insecurity: No Food Insecurity   Worried About Charity fundraiser in the Last Year: Never true   Arboriculturist in the Last Year: Never true  Transportation Needs: No Transportation Needs   Lack of Transportation (Medical): No   Lack of Transportation (Non-Medical): No  Physical Activity: Inactive   Days of Exercise per Week: 0 days   Minutes of Exercise per Session: 0 min  Stress: No Stress Concern Present   Feeling of Stress : Not at all  Social Connections: Socially Isolated   Frequency of Communication with Friends and Family: Once a  week   Frequency of Social Gatherings with Friends and Family: Once a week   Attends Religious Services: Never   Marine scientist or Organizations: No   Attends Music therapist: Never   Marital Status: Married    Tobacco Counseling Counseling given: Not Answered   Clinical Intake:  Pre-visit preparation completed: Yes  Pain : No/denies pain     BMI - recorded: 33.95 Nutritional Status: BMI > 30  Obese Nutritional Risks: None Diabetes: No  How often do you need to have someone help you when you read instructions, pamphlets, or other written materials from your doctor or pharmacy?: 1 - Never  Diabetic?no  Interpreter Needed?: No  Information entered by :: Charlott Rakes, LPN   Activities of Daily Living In your present state of health, do you have any difficulty performing the following activities: 07/11/2021  Hearing? N  Vision? N  Difficulty concentrating or making decisions? N  Walking or climbing stairs? N  Dressing or bathing? N  Doing errands, shopping? N  Preparing Food and eating ? N  Using the Toilet? N  In the past six months, have you accidently leaked urine? Y  Comment with coughing  Do you have problems with loss of bowel control? N  Managing your Medications? N  Managing your Finances? N  Housekeeping or managing your Housekeeping? N  Some recent data might be hidden    Patient  Care Team: Marin Olp, MD as PCP - General (Family Medicine) Rolm Bookbinder, MD as Consulting Physician (General Surgery) Magrinat, Virgie Dad, MD as Consulting Physician (Oncology) Eppie Gibson, MD as Attending Physician (Radiation Oncology) Armbruster, Carlota Raspberry, MD as Consulting Physician (Gastroenterology) Madelin Rear, [facility] as Pharmacist (Pharmacist)  Indicate any recent Medical Services you may have received from other than [facility] providers in the past year (date may be approximate).     Assessment:   This is a routine wellness examination for Christine Leon.  Hearing/Vision screen Hearing Screening - Comments:: Pt denies any hearing issues  Vision Screening - Comments:: Pt follows up with [facility] for annul eye exams   Dietary issues and exercise activities discussed: Current Exercise Habits: The patient does not participate in regular exercise at present   Goals Addressed             This Visit's Progress    Patient Stated       Lose weight        Depression Screen PHQ 2/9 Scores 07/11/2021 05/16/2021 11/04/2020 05/06/2020 05/04/2019 04/19/2018 04/15/2017  PHQ - 2 Score 0 0 0 0 0 1 0  PHQ- 9 Score - - 0 - 2 5 -    Fall Risk Fall Risk  07/11/2021 05/16/2021 11/04/2020 05/06/2020 11/02/2019  Falls in the past year? 0 0 0 0 1  Number falls in past yr: 0 0 0 0 0  Injury with Fall? 0 0 0 0 1  Risk for fall due to : Impaired vision - - - -  Follow up Falls prevention discussed Falls evaluation completed - - -    FALL RISK PREVENTION PERTAINING TO THE HOME:  Any stairs in or around the home? Yes  If so, are there any without handrails? No  Home free of loose throw rugs in walkways, pet beds, electrical cords, etc? Yes  Adequate lighting in your home to reduce risk of falls? Yes   ASSISTIVE DEVICES UTILIZED TO PREVENT FALLS:  Life alert? No  Use of a cane, walker or w/c?  No  TIMED UP AND GO:  Was the test performed? No .   Cognitive Function:      6CIT Screen 07/11/2021  What Year? 0 points  What month? 0 points  What time? 0 points  Count back from 20 0 points  Months in reverse 0 points  Repeat phrase 0 points  Total Score 0    Immunizations Immunization History  Administered Date(s) Administered   Fluad Quad(high Dose 65+) 05/04/2019   Influenza, High Dose Seasonal PF 05/21/2016, 04/19/2018   PFIZER(Purple Top)SARS-COV-2 Vaccination 08/03/2019, 08/22/2019, 05/03/2020   Pneumococcal Conjugate-13 04/14/2016   Pneumococcal Polysaccharide-23 04/15/2017   Td 11/07/2008   Tdap 11/24/2013   Zoster, Live 06/02/2013    TDAP status: Up to date  Flu Vaccine status: Due, Education has been provided regarding the importance of this vaccine. Advised may receive this vaccine at local pharmacy or Health Dept. Aware to provide a copy of the vaccination record if obtained from local pharmacy or Health Dept. Verbalized acceptance and understanding.  Pneumococcal vaccine status: Up to date  Covid-19 vaccine status: Completed vaccines  Qualifies for Shingles Vaccine? Yes   Zostavax completed No   Shingrix Completed?: No.    Education has been provided regarding the importance of this vaccine. Patient has been advised to call insurance company to determine out of pocket expense if they have not yet received this vaccine. Advised may also receive vaccine at local pharmacy or Health Dept. Verbalized acceptance and understanding.  Screening Tests Health Maintenance  Topic Date Due   Zoster Vaccines- Shingrix (1 of 2) Never done   COLONOSCOPY (Pts 45-78yr Insurance coverage will need to be confirmed)  05/27/2020   COVID-19 Vaccine (4 - Booster for Pfizer series) 06/28/2020   INFLUENZA VACCINE  10/10/2021 (Originally 02/10/2021)   Hepatitis C Screening  07/10/2098 (Originally 05/20/1969)   MAMMOGRAM  04/26/2023   TETANUS/TDAP  11/25/2023   Pneumonia Vaccine 70 Years old  Completed   DEXA SCAN  Completed   HPV VACCINES  Aged Out     Health Maintenance  Health Maintenance Due  Topic Date Due   Zoster Vaccines- Shingrix (1 of 2) Never done   COLONOSCOPY (Pts 45-483yrInsurance coverage will need to be confirmed)  05/27/2020   COVID-19 Vaccine (4 - Booster for Pf[place]apoosaeries) 06/28/2020    Colorectal cancer screening: Type of screening: Colonoscopy. Completed 05/27/17. Repeat every 3 years pt will call dr for appt   Mammogram status: Completed 04/25/21. Repeat every year  Bone Density status: Completed 04/07/19. Results reflect: Bone density results: NORMAL. Repeat every 2 years.   Additional Screening:  Hepatitis C Screening: does qualify;  Vision Screening: Recommended annual ophthalmology exams for early detection of glaucoma and other disorders of the eye. Is the patient up to date with their annual eye exam?  Yes  Who is the provider or what is the name of the [facility] which the patient [facility]  If pt is not established with a provider, would they like to be referred to a provider to establish care? No .   Dental Screening: Recommended annual dental exams for proper oral hygiene  Community Resource Referral / Chronic Care Management: CRR required this visit?  No   CCM required this visit?  No      Plan:     I have personally reviewed and noted the following in the patients chart:   Medical and social history Use of alcohol, tobacco or illicit drugs  Current  medications and supplements including opioid prescriptions. Patient is not currently taking opioid prescriptions. Functional ability and status Nutritional status Physical activity Advanced directives List of other physicians Hospitalizations, surgeries, and ER visits in previous 12 months Vitals Screenings to include cognitive, depression, and falls Referrals and appointments  In addition, I have reviewed and discussed with patient certain preventive protocols, quality metrics, and best  practice recommendations. A written personalized care plan for preventive services as well as general preventive health recommendations were provided to patient.     Willette Brace, LPN   [id number]   Nurse Notes: None

## 2021-09-08 DIAGNOSIS — H33303 Unspecified retinal break, bilateral: Secondary | ICD-10-CM | POA: Diagnosis not present

## 2021-09-08 DIAGNOSIS — H524 Presbyopia: Secondary | ICD-10-CM | POA: Diagnosis not present

## 2021-09-08 DIAGNOSIS — H04123 Dry eye syndrome of bilateral lacrimal glands: Secondary | ICD-10-CM | POA: Diagnosis not present

## 2021-09-08 DIAGNOSIS — H2513 Age-related nuclear cataract, bilateral: Secondary | ICD-10-CM | POA: Diagnosis not present

## 2021-09-11 ENCOUNTER — Encounter: Payer: Self-pay | Admitting: Physical Therapy

## 2021-09-11 NOTE — Therapy (Signed)
[facility] ?[place], [place], [address] ?Phone: [phone]   Fax:  [phone] ? ?Patient Details  ?Name: Christine Leon ?MRN: [id number] ?Date of Birth: June 12, 1951 ?Referring Provider:  No ref. provider found ? ?Encounter Date: 09/11/2021 ? ?PHYSICAL THERAPY DISCHARGE SUMMARY ? ?Visits from Start of Care: 1 ? ?Current functional level related to goals / functional outcomes: ?Unable to assess; pt did not return to therapy after initial evaluation.   ?  ?Remaining deficits: ?Dizziness ?  ?Education / Equipment: ?N/A  ? ?Patient agrees to discharge. Patient goals were not met. Patient is being discharged due to not returning since the last visit. ? ? ?Rico Junker, PT, DPT ?09/11/21    11:42 AM ? ? ? ?[facility] ?[place], [place], [address] ?Phone: [phone]   Fax:  [phone] ?

## 2021-09-22 ENCOUNTER — Encounter: Payer: Self-pay | Admitting: Gastroenterology

## 2021-09-25 ENCOUNTER — Encounter: Payer: Self-pay | Admitting: Gastroenterology

## 2021-10-17 ENCOUNTER — Other Ambulatory Visit: Payer: Self-pay | Admitting: Family Medicine

## 2021-11-04 ENCOUNTER — Encounter: Payer: Self-pay | Admitting: Physician Assistant

## 2021-11-04 ENCOUNTER — Ambulatory Visit: Payer: Medicare HMO | Admitting: Physician Assistant

## 2021-11-04 VITALS — BP 128/70 | HR 76 | Ht 65.0 in | Wt 200.0 lb

## 2021-11-04 DIAGNOSIS — Z86010 Personal history of colonic polyps: Secondary | ICD-10-CM

## 2021-11-04 DIAGNOSIS — K900 Celiac disease: Secondary | ICD-10-CM | POA: Diagnosis not present

## 2021-11-04 NOTE — Patient Instructions (Signed)
You have been scheduled for a colonoscopy. Please follow the written instructions given to you at your visit today. ?We have given you a sample of Plenvu bowel prep today. ?If you use inhalers (even only as needed), please bring them with you on the day of your procedure. ? ? ?Thank you for trusting me with your gastrointestinal care!   ? ?Ellouise Newer, PA ? ? ? ? ?BMI: ? ?If you are age 72 or older, your body mass index should be between 23-30. Your Body mass index is 33.28 kg/m?Marland Kitchen If this is out of the aforementioned range listed, please consider follow up with your Primary Care Provider. ? ?If you are age 60 or younger, your body mass index should be between 19-25. Your Body mass index is 33.28 kg/m?Marland Kitchen If this is out of the aformentioned range listed, please consider follow up with your Primary Care Provider.  ? ?MY CHART: ? ?The [facility] providers would like to encourage you to use [facility] to communicate with providers for non-urgent requests or questions.  Due to long hold times on the telephone, sending your provider a message by [facility] may be a faster and more efficient way to get a response.  Please allow 48 business hours for a response.  Please remember that this is for non-urgent requests.  ? ?

## 2021-11-04 NOTE — Progress Notes (Signed)
? ?Chief Complaint: Discuss colonoscopy ? ?HPI: ?    Christine Leon is a 71 year old female with a past medical history as listed below, known to Dr. Havery Moros, who was referred to me by Marin Olp, MD for a consideration of a colonoscopy. ?   05/27/2017 colonoscopy and EGD.  Colonoscopy with diverticulosis in the sigmoid colon, one 4 mm polyp in the transverse colon, two 4 mm polyps in the sigmoid colon, internal hemorrhoids and otherwise normal.  Pathology showed tubular adenomas and repeat was recommended in 3 years.  EGD with normal esophagus, normal stomach and duodenal mucosal changes suspicious for celiac disease. Biopsied biopsies were consistent with celiac disease.  She was told to maintain a strict gluten-free diet.  A DEXA scan was also ordered. ?   05/05/2021 CT the abdomen pelvis with contrast showed diffuse hepatic steatosis.  Surgically absent gallbladder.  Diverticulosis without diverticulitis. ?   05/06/2021 CBC was normal.  CMP with minimally elevated AST of 42 and ALT at 64. ?   Today, the patient tells me that she is just here to discuss a colonoscopy.  Tells me that she has had polyps and she is due for repeat.  Tells me that when she had her EGD back in 2018 she was already diagnosed with celiac disease years before.  She continues to abide by gluten-free diet and has no problems or concerns today. ?   Denies fever, chills, weight loss, change in bowel habits or abdominal pain. ? ?Past Medical History:  ?Diagnosis Date  ? Allergy   ? Celiac sprue   ? History of radiation therapy 05/26/18- 06/29/18  ? Right Breast 15 fractions for a total dose of 40.05 Gy. Right Breast boost 5 fractions for a total dose of 10 Gy  ? Hypertension   ? Hypothyroidism   ? Personal history of radiation therapy 2019  ? PONV (postoperative nausea and vomiting)   ? ? ?Past Surgical History:  ?Procedure Laterality Date  ? ABDOMINAL HYSTERECTOMY    ? in 54s. fibroids. including cervix removal. per patient was told  no further pap smears after.   ? arthroscopic knee surgery    ? 2015 under Dr. Theda Sers  ? BREAST BIOPSY Right 04/01/2018  ? BREAST LUMPECTOMY Right 2019  ? BREAST LUMPECTOMY WITH RADIOACTIVE SEED AND SENTINEL LYMPH NODE BIOPSY Right 04/27/2018  ? Procedure: RIGHT BREAST LUMPECTOMY WITH RADIOACTIVE SEED AND RIGHT AXILLARYSENTINEL LYMPH NODE BIOPSY ERAS PATHWAY;  Surgeon: Rolm Bookbinder, MD;  Location: [facility];  Service: General;  Laterality: Right;  PECTORAL BLOCK  ? CHOLECYSTECTOMY    ? COCCYX REMOVAL    ? after cheerleading accident  ? OOPHORECTOMY    ? in 39s  ? TONSILLECTOMY    ? ? ?Current Outpatient Medications  ?Medication Sig Dispense Refill  ? ALPHA LIPOIC ACID PO Take 1 tablet by mouth daily.    ? anastrozole (ARIMIDEX) 1 MG tablet Take 1 tablet (1 mg total) by mouth daily. 90 tablet 4  ? Cholecalciferol (VITAMIN D-3 PO) Take 1,500 Units by mouth daily.    ? dicyclomine (BENTYL) 20 MG tablet Take 1 tablet (20 mg total) by mouth 2 (two) times daily. 20 tablet 0  ? fish oil-omega-3 fatty acids 1000 MG capsule Take 1 g by mouth daily.    ? fluticasone (FLONASE) 50 MCG/ACT nasal spray Place 2 sprays into both nostrils daily. 16 g 6  ? gabapentin (NEURONTIN) 300 MG capsule TAKE 1 CAPSULE BY MOUTH EVERY DAY AT BEDTIME  90 capsule 4  ? glucosamine-chondroitin 500-400 MG tablet Take 1 tablet by mouth daily.    ? levothyroxine (SYNTHROID) 112 MCG tablet TAKE 1 TABLET BY MOUTH EVERY DAY 90 tablet 3  ? lisinopril-hydrochlorothiazide (ZESTORETIC) 20-25 MG tablet TAKE 1 TABLET BY MOUTH EVERY DAY 90 tablet 3  ? rosuvastatin (CRESTOR) 10 MG tablet Take 1 tablet (10 mg total) by mouth 2 (two) times a week. 26 tablet 3  ? vitamin B-12 (CYANOCOBALAMIN) 500 MCG tablet Take 500 mcg by mouth daily.    ? ?No current facility-administered medications for this visit.  ? ? ?Allergies as of 11/04/2021 - Review Complete 07/11/2021  ?Allergen Reaction Noted  ? Gluten meal  11/24/2013  ? Wheat bran  11/24/2013  ?  Cephalexin Hives 06/03/2007  ? ? ?Family History  ?Problem Relation Age of Onset  ? Hypertension Mother   ? Coronary artery disease Father   ?     73  ? COPD Father   ?     passed age 74  ? Pulmonary fibrosis Father   ? Breast cancer Maternal Aunt   ? Pulmonary fibrosis Paternal Aunt   ? Stomach cancer Cousin   ?     Paternal  ? Colon cancer Neg Hx   ? Esophageal cancer Neg Hx   ? Pancreatic cancer Neg Hx   ? Liver disease Neg Hx   ? ? ?Social History  ? ?Socioeconomic History  ? Marital status: Married  ?  Spouse name: Not on file  ? Number of children: Not on file  ? Years of education: Not on file  ? Highest education level: Not on file  ?Occupational History  ? Not on file  ?Tobacco Use  ? Smoking status: Former  ?  Packs/day: 1.00  ?  Years: 39.00  ?  Pack years: 39.00  ?  Types: Cigarettes  ?  Quit date: 07/13/2006  ?  Years since quitting: 15.3  ? Smokeless tobacco: Never  ?Vaping Use  ? Vaping Use: Never used  ?Substance and Sexual Activity  ? Alcohol use: Yes  ?  Comment: socially  ? Drug use: No  ? Sexual activity: Not on file  ?Other Topics Concern  ? Not on file  ?Social History Narrative  ? Married (husband patient of Dr. Yong Channel), 2 children, 1 grandchild  ? Her mom lives with her. Dad passed 2017- had lived with them.   ?   ? 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   ? Hobbies: Traveling, but very busy recent years caring for father who is ill  ? ?Social Determinants of Health  ? ?Financial Resource Strain: Low Risk   ? Difficulty of Paying Living Expenses: Not hard at all  ?Food Insecurity: No Food Insecurity  ? Worried About Charity fundraiser in the Last Year: Never true  ? Ran Out of Food in the Last Year: Never true  ?Transportation Needs: No Transportation Needs  ? Lack of Transportation (Medical): No  ? Lack of Transportation (Non-Medical): No  ?Physical Activity: Inactive  ? Days of Exercise per Week: 0 days  ? Minutes of Exercise per Session: 0 min  ?Stress: No Stress  Concern Present  ? Feeling of Stress : Not at all  ?Social Connections: Socially Isolated  ? Frequency of Communication with Friends and Family: Once a week  ? Frequency of Social Gatherings with Friends and Family: Once a week  ? Attends Religious Services: Never  ? Active Member  of Clubs or Organizations: No  ? Attends Archivist Meetings: Never  ? Marital Status: Married  ?Intimate Partner Violence: Not At Risk  ? Fear of Current or Ex-Partner: No  ? Emotionally Abused: No  ? Physically Abused: No  ? Sexually Abused: No  ? ? ?Review of Systems:    ?Constitutional: No weight loss, fever or chills ?Skin: No rash  ?Cardiovascular: No chest pain ?Respiratory: No SOB  ?Gastrointestinal: See HPI and otherwise negative ?Genitourinary: No dysuria  ?Neurological: No headache, dizziness or syncope ?Musculoskeletal: No new muscle or joint pain ?Hematologic: No bleeding  ?Psychiatric: No history of depression or anxiety ? ? Physical Exam:  ?Vital signs: ?BP 128/70   Pulse 76   Ht 5' 5"  (1.651 m)   Wt 200 lb (90.7 kg)   LMP  (LMP Unknown)   BMI 33.28 kg/m?   ? ?Constitutional:   Pleasant Elderly Caucasian female appears to be in NAD, Well developed, Well nourished, alert and cooperative ?Head:  Normocephalic and atraumatic. ?Eyes:   PEERL, EOMI. No icterus. Conjunctiva pink. ?Ears:  Normal auditory acuity. ?Neck:  Supple ?Throat: Oral cavity and pharynx without inflammation, swelling or lesion.  ?Respiratory: Respirations even and unlabored. Lungs clear to auscultation bilaterally.   No wheezes, crackles, or rhonchi.  ?Cardiovascular: Normal S1, S2. No MRG. Regular rate and rhythm. No peripheral edema, cyanosis or pallor.  ?Gastrointestinal:  Soft, nondistended, nontender. No rebound or guarding. Normal bowel sounds. No appreciable masses or hepatomegaly. ?Rectal:  Not performed.  ?Msk:  Symmetrical without gross deformities. Without edema, no deformity or joint abnormality.  ?Neurologic:  Alert and  oriented  x4;  grossly normal neurologically.  ?Skin:   Dry and intact without significant lesions or rashes. ?Psychiatric: Demonstrates good judgement and reason without abnormal affect or behaviors. ? ?RELEVANT LABS

## 2021-11-05 ENCOUNTER — Telehealth: Payer: Self-pay

## 2021-11-05 NOTE — Telephone Encounter (Addendum)
-----   Message from Levin Erp, [place] sent at 11/05/2021  1:53 PM EDT ----- ?Regarding: EGD ?See Dr. Verdie Shire, he would like an EGD as well. ?----- Message ----- ?From: Yetta Flock, MD ?Sent: 11/05/2021   1:08 PM EDT ?To: Levin Erp, PA ? ? ? ? ?----- Message ----- ?From: Levin Erp, PA ?Sent: 11/04/2021   1:56 PM EDT ?To: Yetta Flock, MD ? ?Agree with assessment as outlined with the following thoughts: ?- can schedule for colonoscopy but she has a history of celiac, has not had a follow up EGD, and had positive TTG on her last AB assessment a few years ago. I think doing an EGD at same time as colonoscopy is reasonable to reassess her celiac if she is willing. If she does not want to do this would recheck a TTG IgA to see where this is trending. Can you let staff know to help coordinate EGD if she is willing to be done at same time as colonoscopy? Thanks ?

## 2021-11-05 NOTE — Telephone Encounter (Signed)
Called and spoke with patient regarding Dr. Doyne Keel recommendations. Pt would like to add EGD to her current appt. Pt is aware that there are no changes to her instructions. Pt verbalized understanding and had no concerns at the end of the call. ? ?Secure staff message sent to Morton Amy to inform her that we added on an EGD to the 12/25/21 appt.  ?

## 2021-11-05 NOTE — Progress Notes (Signed)
Agree with assessment as outlined with the following thoughts: ?- can schedule for colonoscopy but she has a history of celiac, has not had a follow up EGD, and had positive TTG on her last AB assessment a few years ago. I think doing an EGD at same time as colonoscopy is reasonable to reassess her celiac if she is willing. If she does not want to do this would recheck a TTG IgA to see where this is trending. Can you let staff know to help coordinate EGD if she is willing to be done at same time as colonoscopy? Thanks ? ?

## 2021-11-16 ENCOUNTER — Other Ambulatory Visit: Payer: Self-pay | Admitting: Family Medicine

## 2021-11-20 ENCOUNTER — Encounter: Payer: Medicare HMO | Admitting: Gastroenterology

## 2021-11-25 ENCOUNTER — Encounter: Payer: Self-pay | Admitting: Family Medicine

## 2021-11-25 ENCOUNTER — Ambulatory Visit (INDEPENDENT_AMBULATORY_CARE_PROVIDER_SITE_OTHER): Payer: Medicare HMO | Admitting: Family Medicine

## 2021-11-25 VITALS — BP 122/78 | HR 62 | Temp 97.7°F | Ht 65.0 in | Wt 198.0 lb

## 2021-11-25 DIAGNOSIS — I10 Essential (primary) hypertension: Secondary | ICD-10-CM | POA: Diagnosis not present

## 2021-11-25 DIAGNOSIS — Z170 Estrogen receptor positive status [ER+]: Secondary | ICD-10-CM

## 2021-11-25 DIAGNOSIS — E034 Atrophy of thyroid (acquired): Secondary | ICD-10-CM | POA: Diagnosis not present

## 2021-11-25 DIAGNOSIS — R0789 Other chest pain: Secondary | ICD-10-CM

## 2021-11-25 DIAGNOSIS — E785 Hyperlipidemia, unspecified: Secondary | ICD-10-CM | POA: Diagnosis not present

## 2021-11-25 DIAGNOSIS — I700 Atherosclerosis of aorta: Secondary | ICD-10-CM

## 2021-11-25 DIAGNOSIS — C50411 Malignant neoplasm of upper-outer quadrant of right female breast: Secondary | ICD-10-CM

## 2021-11-25 LAB — CBC WITH DIFFERENTIAL/PLATELET
Basophils Absolute: 0 10*3/uL (ref 0.0–0.1)
Basophils Relative: 0.6 % (ref 0.0–3.0)
Eosinophils Absolute: 0.1 10*3/uL (ref 0.0–0.7)
Eosinophils Relative: 1.6 % (ref 0.0–5.0)
HCT: 41.7 % (ref 36.0–46.0)
Hemoglobin: 14.3 g/dL (ref 12.0–15.0)
Lymphocytes Relative: 46 % (ref 12.0–46.0)
Lymphs Abs: 2.3 10*3/uL (ref 0.7–4.0)
MCHC: 34.4 g/dL (ref 30.0–36.0)
MCV: 87.9 fl (ref 78.0–100.0)
Monocytes Absolute: 0.4 10*3/uL (ref 0.1–1.0)
Monocytes Relative: 7.8 % (ref 3.0–12.0)
Neutro Abs: 2.2 10*3/uL (ref 1.4–7.7)
Neutrophils Relative %: 44 % (ref 43.0–77.0)
Platelets: 252 10*3/uL (ref 150.0–400.0)
RBC: 4.74 Mil/uL (ref 3.87–5.11)
RDW: 14.1 % (ref 11.5–15.5)
WBC: 4.9 10*3/uL (ref 4.0–10.5)

## 2021-11-25 LAB — COMPREHENSIVE METABOLIC PANEL
ALT: 52 U/L — ABNORMAL HIGH (ref 0–35)
AST: 37 U/L (ref 0–37)
Albumin: 4.4 g/dL (ref 3.5–5.2)
Alkaline Phosphatase: 63 U/L (ref 39–117)
BUN: 12 mg/dL (ref 6–23)
CO2: 28 mEq/L (ref 19–32)
Calcium: 9.8 mg/dL (ref 8.4–10.5)
Chloride: 104 mEq/L (ref 96–112)
Creatinine, Ser: 0.77 mg/dL (ref 0.40–1.20)
GFR: 77.56 mL/min (ref >60.00)
Glucose, Bld: 95 mg/dL (ref 70–99)
Potassium: 3.6 mEq/L (ref 3.5–5.1)
Sodium: 140 mEq/L (ref 135–145)
Total Bilirubin: 0.8 mg/dL (ref 0.2–1.2)
Total Protein: 6.9 g/dL (ref 6.0–8.3)

## 2021-11-25 LAB — TSH: TSH: 0.41 u[IU]/mL (ref 0.35–5.50)

## 2021-11-25 LAB — LIPID PANEL
Cholesterol: 128 mg/dL (ref 0–200)
HDL: 51.4 mg/dL (ref >39.00)
LDL Cholesterol: 59 mg/dL (ref 0–99)
NonHDL: 77.04
Total CHOL/HDL Ratio: 2
Triglycerides: 91 mg/dL (ref 0.0–149.0)
VLDL: 18.2 mg/dL (ref 0.0–40.0)

## 2021-11-25 MED ORDER — ROSUVASTATIN CALCIUM 10 MG PO TABS: 10.0000 mg | ORAL_TABLET | ORAL | 3 refills | Status: DC

## 2021-11-25 NOTE — Progress Notes (Signed)
?Phone [phone] ?In person visit ?  ?Subjective:  ? ?Christine Leon is a 71 y.o. year old very pleasant female patient who presents for/with See problem oriented charting ?Chief Complaint  ?Patient presents with  ? Follow-up  ? Hypertension  ? Hypothyroidism  ? heart cramping  ?  Pt c/o heart cramping and pressure around heart and heart racing last month.  ? ? ?Past Medical History-  ?Patient Active Problem List  ? Diagnosis Date Noted  ? Malignant neoplasm of upper-outer quadrant of right breast in female, estrogen receptor positive ([place]) 04/22/2018  ?  Priority: High  ? Hepatic steatosis 05/06/2021  ?  Priority: Medium   ? Hyperlipidemia 05/06/2020  ?  Priority: Medium   ? Aortic atherosclerosis ([place]) 04/15/2017  ?  Priority: Medium   ? Hot flashes 08/15/2014  ?  Priority: Medium   ? Obesity 08/15/2014  ?  Priority: Medium   ? Hypothyroidism 07/19/2014  ?  Priority: Medium   ? CELIAC SPRUE 04/20/2008  ?  Priority: Medium   ? Essential hypertension 06/05/2007  ?  Priority: Medium   ? History of adenomatous polyp of colon 06/07/2017  ?  Priority: Low  ? Retinal tear 08/15/2014  ?  Priority: Low  ? Left knee pain 07/25/2014  ?  Priority: Low  ? Allergic rhinitis 07/20/2007  ?  Priority: Low  ? Diverticulosis 05/06/2021  ? ? ?Medications- reviewed and updated ?Current Outpatient Medications  ?Medication Sig Dispense Refill  ? ALPHA LIPOIC ACID PO Take 1 tablet by mouth daily.    ? anastrozole (ARIMIDEX) 1 MG tablet Take 1 tablet (1 mg total) by mouth daily. 90 tablet 4  ? aspirin EC 81 MG tablet Take 81 mg by mouth daily. Swallow whole.    ? Cholecalciferol (VITAMIN D-3 PO) Take 1,500 Units by mouth daily.    ? dicyclomine (BENTYL) 20 MG tablet Take 1 tablet (20 mg total) by mouth 2 (two) times daily. 20 tablet 0  ? fish oil-omega-3 fatty acids 1000 MG capsule Take 1 g by mouth daily.    ? fluticasone (FLONASE) 50 MCG/ACT nasal spray Place 2 sprays into both nostrils daily. 16 g 6  ? gabapentin (NEURONTIN)  300 MG capsule TAKE 1 CAPSULE BY MOUTH EVERY DAY AT BEDTIME 90 capsule 4  ? glucosamine-chondroitin 500-400 MG tablet Take 1 tablet by mouth daily.    ? levothyroxine (SYNTHROID) 112 MCG tablet TAKE 1 TABLET BY MOUTH EVERY DAY 90 tablet 3  ? lisinopril-hydrochlorothiazide (ZESTORETIC) 20-25 MG tablet TAKE 1 TABLET BY MOUTH EVERY DAY 90 tablet 3  ? vitamin B-12 (CYANOCOBALAMIN) 500 MCG tablet Take 500 mcg by mouth daily.    ? [START ON 11/27/2021] rosuvastatin (CRESTOR) 10 MG tablet Take 1 tablet (10 mg total) by mouth 2 (two) times a week. 90 tablet 3  ? ?No current facility-administered medications for this visit.  ? ?  ?Objective:  ?BP 122/78   Pulse 62   Temp 97.7 ?F (36.5 ?C)   Ht 5' 5"  (1.651 m)   Wt 198 lb (89.8 kg)   LMP  (LMP Unknown)   SpO2 98%   BMI 32.95 kg/m?  ?Gen: NAD, resting comfortably ?CV: RRR no murmurs rubs or gallops ?Lungs: CTAB no crackles, wheeze, rhonchi ?-mild chest wall discomfort mildline and just to left ?Abdomen: soft/nontender/nondistended/normal bowel sounds. No rebound or guarding.  ?Ext: trace edema ?Skin: warm, dry ? ? EKG: sinus rhythm with rate 64, normal axis, normal intervals, no hypertrophy, no st or  t wave changes   ?  ? ?Assessment and Plan  ? ?#Chest discomfort ?S: Patient complains of a sensation of chest cramping and pressure locatedfor the last month- started first night with cramping in chest- woke her from sleep - "felt like an elephant on her chest".  Also noting some palpitations during this timeframe . Did have some chest pain that was very heavy while digging in garden as well 3 weeks ago. Also short of breath, lightheaded, sweaty with left arm and neck pain. Laid down and pain resolved after 15 minutes.  Last episode of pain 3 weeks ago.  ?-has gone back out and done some lifting in garden but not as aggressive as the shoveling day without symptoms.  ?-father and grandmother with heart disease. Dad was in 79s.  ?-mild chest wall discomfort bu tnot similar to  pain she felt ?A/P: given she has had pain in her chest that felt like an elephant on chest that woke her up from sleep and then a week later had exertional chest pain with some typical symptoms (exertional, relieved by rest, substernal) ?-EKG reassuring today ?-I still think with her symptoms needs to see cardiology in coming weeks and take things easy during that time frame ?-do want her to take aspirin and if tolerates take the rosuvastatin daily. ? ?#Right rib/right thoracic back pain-noted at last visit and had started sometime after working out on a rowing machine and working with a trainer that was keeping her up at night-oncology had ordered MRI lumbar and thoracic spine and patient also was to see GI ?- In Regards to MRI ?"IMPRESSION: ?1. No evidence of metastatic lesion to the thoracic or lumbar spine. ?2. Hemangiomas in the T9 and T12 vertebral bodies. ?3. Mild degenerative changes of the thoracic spine without ?high-grade spinal canal or neural foraminal stenosis at any level. ?4. Degenerative changes of the lumbar spine, more pronounced at the ?level of the facet joints with mild periarticular contrast ?enhancement from L3-4 through L5-S1, suggesting active ?osteoarthritis. ?5. Tiny right subarticular disc protrusion at L5-S1 abutting the ?traversing right S1 nerve root. ?6. No high-grade spinal canal or foraminal stenosis of the lumbar ?spine." ?- In regards to sleep we trialed gabapentin 300 mg as well as tramadol- she ended up going to chiropractor and that was helpful  ?-In regards to GI tract-upcoming colonoscopy and EGD to reassess celiac- but once again not having issues after adjustment  ? ?#hypertension ?S: medication: Lisinopril hydrochlorothiazide 20-25 mg daily ?Home readings #s: not checking ?A/P: Controlled. Continue current medications.  ? ?#hyperlipidemia with aortic atherosclerosis ?S: Medication:Rosuvastatin 10 mg twice a week ?-LDL at least under 100-technically LDL goal would be  under 70 ? A/P: Thankfully she has been tolerating rosuvastatin-with recent chest pain we opted to increase to daily until we further evaluate the chest pain ?  ? #hypothyroidism ?S: compliant On thyroid medication-levothyroxine 112 mcg  ?A/P:hopefully stable- update tsh today. Continue current meds for now ?  ?#Fatty liver with mild LFT elevations.  We have discussed working on lifestyle changes - down 6 lbs from November- doing 16, 8 intermittent fasting- very helpful for her! Check LFTs today ?Wt Readings from Last 3 Encounters:  ?11/25/21 198 lb (89.8 kg)  ?11/04/21 200 lb (90.7 kg)  ?05/16/21 204 lb (92.5 kg)  ? ?# some foot pain- thinks neuropathy- doing turmeric, fish oil and it seems to help. Already takes b12 ? ?# breast cancer- has mammogram in November. Remains on arimidex and stale -  continue to monitor ? ?Recommended follow up: Return for next already scheduled visit or sooner if needed. ?Future Appointments  ?Date Time Provider [place]  ?12/25/2021  8:30 AM Armbruster, Carlota Raspberry, MD [facility]  ?05/19/2022  9:00 AM [facility] None  ?05/19/2022  9:30 AM Nicholas Lose, MD [facility] None  ?05/20/2022  9:40 AM Marin Olp, MD [facility]  ?07/16/2022  3:15 PM [facility] HEALTH COACH [facility]  ? ?Lab/Order associations: ?  ICD-10-CM   ?1. Chest pressure  R07.89 EKG 12-Lead  ?  Ambulatory referral to Cardiology  ?  ?2. Hypothyroidism due to acquired atrophy of thyroid  E03.4 TSH  ?  ?3. Hyperlipidemia, unspecified hyperlipidemia type  E78.5 Lipid panel  ?  CBC with Differential/Platelet  ?  Comprehensive metabolic panel  ?  ?4. Essential hypertension  I10 CBC with Differential/Platelet  ?  Comprehensive metabolic panel  ?  ?5. Aortic atherosclerosis (HCC) Chronic I70.0   ?  ?6. Malignant neoplasm of upper-outer quadrant of right breast in female, estrogen receptor positive (HCC) Chronic C50.411   ? Z17.0   ?  ? ? ?Meds ordered this encounter  ?Medications  ?  rosuvastatin (CRESTOR) 10 MG tablet  ?  Sig: Take 1 tablet (10 mg total) by mouth 2 (two) times a week.  ?  Dispense:  90 tablet  ?  Refill:  3  ? ? ?Return precautions advised.  ?Garret Reddish, MD ? ?

## 2021-11-25 NOTE — Patient Instructions (Addendum)
We will call you within two weeks about your referral to cardiology. If you do not hear within 2 weeks, give [place] a call.   ?-take it easy for now! If you have pain despite rest please seek care ?-do want her to take aspirin and if tolerates take the rosuvastatin daily. ? ?Please stop by lab before you go ?If you have mychart- we will send your results within 3 business days of [place] receiving them.  ?If you do not have mychart- we will call you about results within 5 business days of [place] receiving them.  ?*please also note that you will see labs on mychart as soon as they post. I will later go in and write notes on them- will say "notes from Dr. Yong Channel"  ? ?Recommended follow up: Return for next already scheduled visit or sooner if needed. Or can reschedule to take off your birthday!  ?

## 2021-12-07 NOTE — Progress Notes (Unsigned)
Cardiology Office Note   Date:  12/12/2021   ID:  Christine Leon, DOB 12-23-1950, MRN [id number]  PCP:  Marin Olp, MD  Cardiologist:   Thressa Shiffer [place], MD   Chief Complaint  Patient presents with   Chest Pain      History of Present Illness: Christine Leon is a 71 y.o. female who is seen at the request of Dr Yong Channel for evaluation of chest pain. She has a history of HTN, HLD,  and hypothyroidism. Prior RT for breast CA. She reports in March she awoke at 2 am with pain around her lower ribs and chest. Felt like something was grabbing her heart. The following day she felt very fatigued. Since then she has had 3-4 episodes where she will be working in her yard and get acutely nauseated and weak. Feels lightheaded. Gets very fatigued for a day. No clear triggers. Has to lie down.     Past Medical History:  Diagnosis Date   Allergy    Celiac sprue    History of radiation therapy 05/26/18- 06/29/18   Right Breast 15 fractions for a total dose of 40.05 Gy. Right Breast boost 5 fractions for a total dose of 10 Gy   Hypertension    Hypothyroidism    Personal history of radiation therapy 2019   PONV (postoperative nausea and vomiting)     Past Surgical History:  Procedure Laterality Date   ABDOMINAL HYSTERECTOMY     in 6s. fibroids. including cervix removal. per patient was told no further pap smears after.    arthroscopic knee surgery     2015 under Dr. Theda Sers   BREAST BIOPSY Right 04/01/2018   BREAST LUMPECTOMY Right 2019   BREAST LUMPECTOMY WITH RADIOACTIVE SEED AND SENTINEL LYMPH NODE BIOPSY Right 04/27/2018   Procedure: RIGHT BREAST LUMPECTOMY WITH RADIOACTIVE SEED AND RIGHT AXILLARYSENTINEL LYMPH NODE BIOPSY ERAS PATHWAY;  Surgeon: Rolm Bookbinder, MD;  Location: [facility];  Service: General;  Laterality: Right;  PECTORAL BLOCK   CHOLECYSTECTOMY     COCCYX REMOVAL     after cheerleading accident   OOPHORECTOMY     in 74s    TONSILLECTOMY       Current Outpatient Medications  Medication Sig Dispense Refill   ALPHA LIPOIC ACID PO Take 1 tablet by mouth daily.     anastrozole (ARIMIDEX) 1 MG tablet Take 1 tablet (1 mg total) by mouth daily. 90 tablet 4   aspirin EC 81 MG tablet Take 81 mg by mouth daily. Swallow whole.     Cholecalciferol (VITAMIN D-3 PO) Take 1,500 Units by mouth daily.     dicyclomine (BENTYL) 20 MG tablet Take 1 tablet (20 mg total) by mouth 2 (two) times daily. 20 tablet 0   fish oil-omega-3 fatty acids 1000 MG capsule Take 1 g by mouth daily.     fluticasone (FLONASE) 50 MCG/ACT nasal spray Place 2 sprays into both nostrils daily. 16 g 6   gabapentin (NEURONTIN) 300 MG capsule TAKE 1 CAPSULE BY MOUTH EVERY DAY AT BEDTIME 90 capsule 4   glucosamine-chondroitin 500-400 MG tablet Take 1 tablet by mouth daily.     levothyroxine (SYNTHROID) 112 MCG tablet TAKE 1 TABLET BY MOUTH EVERY DAY 90 tablet 3   lisinopril-hydrochlorothiazide (ZESTORETIC) 20-25 MG tablet TAKE 1 TABLET BY MOUTH EVERY DAY 90 tablet 3   rosuvastatin (CRESTOR) 10 MG tablet Take 1 tablet (10 mg total) by mouth 2 (two) times a week. 90 tablet 3  vitamin B-12 (CYANOCOBALAMIN) 500 MCG tablet Take 500 mcg by mouth daily.     No current facility-administered medications for this visit.    Allergies:   Gluten meal, Wheat bran, and Cephalexin    Social History:  The patient  reports that she quit smoking about 15 years ago. Her smoking use included cigarettes. She has a 39.00 pack-year smoking history. She has never used smokeless tobacco. She reports current alcohol use. She reports that she does not use drugs.   Family History:  The patient's family history includes Breast cancer in her maternal aunt; COPD in her father; Coronary artery disease in her father; Hypertension in her mother; Idiopathic pulmonary fibrosis in her father; Pulmonary fibrosis in her father and paternal aunt; Stomach cancer in her cousin.    ROS:  Please  see the history of present illness.   Otherwise, review of systems are positive for none.   All other systems are reviewed and negative.    PHYSICAL EXAM: VS:  BP 136/70   Pulse 68   Ht 5' 5.5" (1.664 m)   Wt 196 lb 3.2 oz (89 kg)   LMP  (LMP Unknown)   SpO2 98%   BMI 32.15 kg/m  , BMI Body mass index is 32.15 kg/m. GEN: Well nourished, well developed, in no acute distress HEENT: normal Neck: no JVD, carotid bruits, or masses Cardiac: RRR; no murmurs, rubs, or gallops,no edema  Respiratory:  clear to auscultation bilaterally, normal work of breathing GI: soft, nontender, nondistended, + BS MS: no deformity or atrophy Skin: warm and dry, no rash Neuro:  Strength and sensation are intact Psych: euthymic mood, full affect   EKG:  EKG is ordered today. The ekg ordered today demonstrates NSR with rate 68. Normal Ecg. I have personally reviewed and interpreted this study.    Recent Labs: 11/25/2021: ALT 52; BUN 12; Creatinine, Ser 0.77; Hemoglobin 14.3; Platelets 252.0; Potassium 3.6; Sodium 140; TSH 0.41    Lipid Panel    Component Value Date/Time   CHOL 128 11/25/2021 1041   TRIG 91.0 11/25/2021 1041   HDL 51.40 11/25/2021 1041   CHOLHDL 2 11/25/2021 1041   VLDL 18.2 11/25/2021 1041   LDLCALC 59 11/25/2021 1041   LDLCALC 103 (H) 05/06/2020 1140   LDLDIRECT 84.0 11/04/2020 1128      Wt Readings from Last 3 Encounters:  12/12/21 196 lb 3.2 oz (89 kg)  11/25/21 198 lb (89.8 kg)  11/04/21 200 lb (90.7 kg)      Other studies Reviewed: Additional studies/ records that were reviewed today include: none. Review of the above records demonstrates: N/A   ASSESSMENT AND PLAN:  1.  Chest pain and symptoms of acute nausea accompanied by severe fatigue. Etiology unclear. Possible anginal equivalent symptoms. Risk factors of HTN and HLD. Discussed further evaluation with stress testing versus coronary CTA. She would prefer CT. Will arrange. 2. HTN controlled. 3. HLD  excellent control on low dose statin twice a week.    Current medicines are reviewed at length with the patient today.  The patient does not have concerns regarding medicines.  The following changes have been made:  no change  Labs/ tests ordered today include:  Coronary CTA        Disposition:   FU TBD depending on results of CT.   Signed, Ryett Hamman [place], MD  12/12/2021 3:16 PM    [facility] [address] [place], [place], [address] Phone [phone], Fax [phone]

## 2021-12-12 ENCOUNTER — Encounter: Payer: Self-pay | Admitting: Cardiology

## 2021-12-12 ENCOUNTER — Ambulatory Visit: Payer: Medicare HMO | Admitting: Cardiology

## 2021-12-12 VITALS — BP 136/70 | HR 68 | Ht 65.5 in | Wt 196.2 lb

## 2021-12-12 DIAGNOSIS — I10 Essential (primary) hypertension: Secondary | ICD-10-CM

## 2021-12-12 DIAGNOSIS — R079 Chest pain, unspecified: Secondary | ICD-10-CM

## 2021-12-12 DIAGNOSIS — R5383 Other fatigue: Secondary | ICD-10-CM | POA: Diagnosis not present

## 2021-12-12 DIAGNOSIS — E7800 Pure hypercholesterolemia, unspecified: Secondary | ICD-10-CM | POA: Diagnosis not present

## 2021-12-12 DIAGNOSIS — R072 Precordial pain: Secondary | ICD-10-CM

## 2021-12-12 MED ORDER — METOPROLOL TARTRATE 50 MG PO TABS: 100.0000 mg | ORAL_TABLET | Freq: Once | ORAL | 0 refills | Status: DC

## 2021-12-12 NOTE — Patient Instructions (Addendum)
Medication Instructions:  See instruction below  *If you need a refill on your cardiac medications before your next appointment, please call your pharmacy*   Lab Work: See instruction below  If you have labs (blood work) drawn today and your tests are completely normal, you will receive your results only by: [place] (if you have MyChart) OR A paper copy in the mail If you have any lab test that is abnormal or we need to change your treatment, we will call you to review the results.   Testing/Procedures:  Your physician has requested that you have cardiac CTA. Cardiac computed tomography (CT) is a painless test that uses an x-ray machine to take clear, detailed pictures of your heart.Please follow instruction sheet as given.    Follow-Up: At [facility], you and your health needs are our priority.  As part of our continuing mission to provide you with exceptional heart care, we have created designated Provider Care Teams.  These Care Teams include your primary Cardiologist (physician) and Advanced Practice Providers (APPs -  Physician Assistants and Nurse Practitioners) who all work together to provide you with the care you need, when you need it.     Your next appointment:   2 month(s)  The format for your next appointment:   In Person  Provider:   None    Other Instructions    Your cardiac CT will be scheduled at the below location:   [facility] [address] [place], [place] [address] [phone]    If scheduled at [facility], please arrive at the [facility] and Children's Entrance (Entrance C2) of [facility] 30 minutes prior to test start time. You can use the FREE valet parking offered at entrance C (encouraged to control the heart rate for the test)  Proceed to the [facility] Radiology Department (first floor) to check-in and test prep.  All radiology patients and guests should use entrance C2 at [facility],  accessed from [facility], even though the hospital's physical address listed is [address].    .  Please follow these instructions carefully (unless otherwise directed):  IF BMP IS NEEDED THE NAVIGATOR NURSE FOR TESTING  WILL LET YOU KNOW  WHEN THEY CALL YOU   On the Night Before the Test: Be sure to Drink plenty of water. Do not consume any caffeinated/decaffeinated beverages or chocolate 12 hours prior to your test. Do not take any antihistamines 12 hours prior to your test.   On the Day of the Test: Drink plenty of water until 1 hour prior to the test. Do not eat any food 4 hours prior to the test. You may take your regular medications prior to the test.  Take metoprolol (Lopressor)  100 MG two hours prior to test. HOLD LISINOPRIL -Hydrochlorothiazide morning of the test. FEMALES- please wear underwire-free bra if available, avoid dresses & tight clothing       After the Test: Drink plenty of water. After receiving IV contrast, you may experience a mild flushed feeling. This is normal. On occasion, you may experience a mild rash up to 24 hours after the test. This is not dangerous. If this occurs, you can take Benadryl 25 mg and increase your fluid intake. If you experience trouble breathing, this can be serious. If it is severe call 911 IMMEDIATELY. If it is mild, please call our office. .  We will call to schedule your test 2-4 weeks out understanding that  some insurance companies will need an authorization prior to the service being performed.   For non-scheduling related questions, please contact the cardiac imaging nurse navigator should you have any questions/concerns: Marchia Bond, Cardiac Imaging Nurse Navigator Gordy Clement, Cardiac Imaging Nurse Navigator [facility] Direct Office Dial: [phone]   For scheduling needs, including cancellations and rescheduling, please call [place], [phone].

## 2021-12-18 ENCOUNTER — Encounter: Payer: Self-pay | Admitting: Gastroenterology

## 2021-12-25 ENCOUNTER — Ambulatory Visit (AMBULATORY_SURGERY_CENTER): Payer: Medicare HMO | Admitting: Gastroenterology

## 2021-12-25 ENCOUNTER — Encounter: Payer: Medicare HMO | Admitting: Gastroenterology

## 2021-12-25 ENCOUNTER — Encounter: Payer: Self-pay | Admitting: Gastroenterology

## 2021-12-25 VITALS — BP 145/70 | HR 58 | Temp 97.7°F | Resp 19 | Ht 65.0 in | Wt 200.0 lb

## 2021-12-25 DIAGNOSIS — Z860100 Personal history of colon polyps, unspecified: Secondary | ICD-10-CM

## 2021-12-25 DIAGNOSIS — D123 Benign neoplasm of transverse colon: Secondary | ICD-10-CM | POA: Diagnosis not present

## 2021-12-25 DIAGNOSIS — Z09 Encounter for follow-up examination after completed treatment for conditions other than malignant neoplasm: Secondary | ICD-10-CM

## 2021-12-25 DIAGNOSIS — K6389 Other specified diseases of intestine: Secondary | ICD-10-CM | POA: Diagnosis not present

## 2021-12-25 DIAGNOSIS — K900 Celiac disease: Secondary | ICD-10-CM | POA: Diagnosis not present

## 2021-12-25 DIAGNOSIS — K449 Diaphragmatic hernia without obstruction or gangrene: Secondary | ICD-10-CM | POA: Diagnosis not present

## 2021-12-25 DIAGNOSIS — K621 Rectal polyp: Secondary | ICD-10-CM

## 2021-12-25 DIAGNOSIS — D125 Benign neoplasm of sigmoid colon: Secondary | ICD-10-CM | POA: Diagnosis not present

## 2021-12-25 MED ORDER — SODIUM CHLORIDE 0.9 % IV SOLN: 500.0000 mL | Freq: Once | INTRAVENOUS | Status: DC

## 2021-12-25 NOTE — Progress Notes (Signed)
I have reviewed the patient's medical history in detail and updated the computerized patient record.   VS BY CW.

## 2021-12-25 NOTE — Patient Instructions (Addendum)
Handouts provided on polyps, diverticulosis, hemorrhoids, celiac disease and hiatal hernia.   Recommend 100% gluten free diet to prevent complications from celiac disease. (See handout provided.)  YOU HAD AN ENDOSCOPIC PROCEDURE TODAY AT [place]:   Refer to the procedure report that was given to you for any specific questions about what was found during the examination.  If the procedure report does not answer your questions, please call your gastroenterologist to clarify.  If you requested that your care partner not be given the details of your procedure findings, then the procedure report has been included in a sealed envelope for you to review at your convenience later.  YOU SHOULD EXPECT: Some feelings of bloating in the abdomen. Passage of more gas than usual.  Walking can help get rid of the air that was put into your GI tract during the procedure and reduce the bloating. If you had a lower endoscopy (such as a colonoscopy or flexible sigmoidoscopy) you may notice spotting of blood in your stool or on the toilet paper. If you underwent a bowel prep for your procedure, you may not have a normal bowel movement for a few days.  Please Note:  You might notice some irritation and congestion in your nose or some drainage.  This is from the oxygen used during your procedure.  There is no need for concern and it should clear up in a day or so.  SYMPTOMS TO REPORT IMMEDIATELY:  Following lower endoscopy (colonoscopy or flexible sigmoidoscopy):  Excessive amounts of blood in the stool  Significant tenderness or worsening of abdominal pains  Swelling of the abdomen that is new, acute  Fever of 100F or higher  Following upper endoscopy (EGD)  Vomiting of blood or coffee ground material  New chest pain or pain under the shoulder blades  Painful or persistently difficult swallowing  New shortness of breath  Fever of 100F or higher  Black, tarry-looking stools  For urgent or  emergent issues, a gastroenterologist can be reached at any hour by calling [phone]. Do not use MyChart messaging for urgent concerns.    DIET:  We do recommend a small meal at first, but then you may proceed to your regular diet.  Drink plenty of fluids but you should avoid alcoholic beverages for 24 hours.  ACTIVITY:  You should plan to take it easy for the rest of today and you should NOT DRIVE or use heavy machinery until tomorrow (because of the sedation medicines used during the test).    FOLLOW UP: Our staff will call the number listed on your records 24-72 hours following your procedure to check on you and address any questions or concerns that you may have regarding the information given to you following your procedure. If we do not reach you, we will leave a message.  We will attempt to reach you two times.  During this call, we will ask if you have developed any symptoms of COVID 19. If you develop any symptoms (ie: fever, flu-like symptoms, shortness of breath, cough etc.) before then, please call [phone].  If you test positive for Covid 19 in the 2 weeks post procedure, please call and report this information to [place].    If any biopsies were taken you will be contacted by phone or by letter within the next 1-3 weeks.  Please call us at [phone] if you have not heard about the biopsies in 3 weeks.    SIGNATURES/CONFIDENTIALITY: You and/or your care  partner have signed paperwork which will be entered into your electronic medical record.  These signatures attest to the fact that that the information above on your After Visit Summary has been reviewed and is understood.  Full responsibility of the confidentiality of this discharge information lies with you and/or your care-partner.

## 2021-12-25 NOTE — Op Note (Signed)
[place] Patient Name: Christine Leon Procedure Date: 12/25/2021 7:41 AM MRN: [id number] Endoscopist: Remo Lipps P. Havery Moros , MD Age: 70 Referring MD:  Date of Birth: 1950-07-24 Gender: Female Account #: [id number] Procedure:                Upper GI endoscopy Indications:              Follow-up of celiac disease, last EGD 2018 -                            patient mostly gluten free but admits to periodic                            gluten ingestion Medicines:                Monitored Anesthesia Care Procedure:                Pre-Anesthesia Assessment:                           - Prior to the procedure, a History and Physical                            was performed, and patient medications and                            allergies were reviewed. The patient's tolerance of                            previous anesthesia was also reviewed. The risks                            and benefits of the procedure and the sedation                            options and risks were discussed with the patient.                            All questions were answered, and informed consent                            was obtained. Prior Anticoagulants: The patient has                            taken no previous anticoagulant or antiplatelet                            agents. ASA Grade Assessment: II - A patient with                            mild systemic disease. After reviewing the risks                            and benefits, the patient was deemed in  satisfactory condition to undergo the procedure.                           After obtaining informed consent, the endoscope was                            passed under direct vision. Throughout the                            procedure, the patient's blood pressure, pulse, and                            oxygen saturations were monitored continuously. The                            Endoscope was introduced through  the mouth, and                            advanced to the second part of duodenum. The upper                            GI endoscopy was accomplished without difficulty.                            The patient tolerated the procedure well. Scope In: Scope Out: Findings:                 Esophagogastric landmarks were identified: the                            Z-line was found at 37 cm, the gastroesophageal                            junction was found at 37 cm and the upper extent of                            the gastric folds was found at 38 cm from the                            incisors.                           A 1 cm hiatal hernia was present.                           The exam of the esophagus was otherwise normal.                           The entire examined stomach was normal.                           Flattening and mild scalloping was found in the  duodenal bulb, flattening was found in the second                            portion of the duodenum. Biopsies for histology                            were taken with a cold forceps for evaluation of                            celiac disease. Complications:            No immediate complications. Estimated blood loss:                            Minimal. Estimated Blood Loss:     Estimated blood loss was minimal. Impression:               - Esophagogastric landmarks identified.                           - 1 cm hiatal hernia.                           - Normal esophagus otherwise                           - Normal stomach.                           - Duodenal mucosal changes seen, suspicious for                            celiac disease. Biopsied.                           Overall, she appears to have some active celiac                            disease, will await biopsy results. Recommend 100%                            gluten free diet to prevent complications from                            celiac  disease. Recommendation:           - Patient has a contact number available for                            emergencies. The signs and symptoms of potential                            delayed complications were discussed with the                            patient. Return to normal activities tomorrow.  Written discharge instructions were provided to the                            patient.                           - Resume previous diet.                           - Continue present medications.                           - Await pathology results. Remo Lipps P. Joelene Barriere, MD 12/25/2021 9:12:43 AM This report has been signed electronically.

## 2021-12-25 NOTE — Progress Notes (Signed)
[facility] History and Physical   Primary Care Physician:  Marin Olp, MD   Reason for Procedure:   History of colon polyps, history of celiac disease  Plan:    EGD and colonoscopy     HPI: Christine Leon is a 71 y.o. female  here for EGD and colonoscopy to evaluate issues below. 3 adenomas removed in 2018, she also has a history of celiac disease, last EGD in 2018 showed active disease. She has not had follow up evaluation for this. Is on a gluten free diet but not 100% compliant. No family history of colon cancer known. Otherwise feels well without any cardiopulmonary symptoms.    Past Medical History:  Diagnosis Date   Allergy    Celiac sprue    History of radiation therapy 05/26/18- 06/29/18   Right Breast 15 fractions for a total dose of 40.05 Gy. Right Breast boost 5 fractions for a total dose of 10 Gy   Hypertension    Hypothyroidism    Personal history of radiation therapy 2019   PONV (postoperative nausea and vomiting)     Past Surgical History:  Procedure Laterality Date   ABDOMINAL HYSTERECTOMY     in 68s. fibroids. including cervix removal. per patient was told no further pap smears after.    arthroscopic knee surgery     2015 under Dr. Theda Sers   BREAST BIOPSY Right 04/01/2018   BREAST LUMPECTOMY Right 2019   BREAST LUMPECTOMY WITH RADIOACTIVE SEED AND SENTINEL LYMPH NODE BIOPSY Right 04/27/2018   Procedure: RIGHT BREAST LUMPECTOMY WITH RADIOACTIVE SEED AND RIGHT AXILLARYSENTINEL LYMPH NODE BIOPSY ERAS PATHWAY;  Surgeon: Rolm Bookbinder, MD;  Location: [facility];  Service: General;  Laterality: Right;  PECTORAL BLOCK   CHOLECYSTECTOMY     COCCYX REMOVAL     after cheerleading accident   OOPHORECTOMY     in 32s   TONSILLECTOMY      Prior to Admission medications   Medication Sig Start Date End Date Taking? Authorizing Provider  ALPHA LIPOIC ACID PO Take 1 tablet by mouth daily.   Yes [provider]   anastrozole (ARIMIDEX) 1 MG tablet Take 1 tablet (1 mg total) by mouth daily. 05/06/21  Yes Magrinat, Virgie Dad, MD  aspirin EC 81 MG tablet Take 81 mg by mouth daily. Swallow whole.   Yes [provider]  Cholecalciferol (VITAMIN D-3 PO) Take 1,500 Units by mouth daily.   Yes [provider]  fish oil-omega-3 fatty acids 1000 MG capsule Take 1 g by mouth daily.   Yes [provider]  fluticasone (FLONASE) 50 MCG/ACT nasal spray Place 2 sprays into both nostrils daily. 08/12/16  Yes Nafziger, Tommi Rumps, NP  gabapentin (NEURONTIN) 300 MG capsule TAKE 1 CAPSULE BY MOUTH EVERY DAY AT BEDTIME 05/06/21  Yes Magrinat, Virgie Dad, MD  glucosamine-chondroitin 500-400 MG tablet Take 1 tablet by mouth daily.   Yes [provider]  levothyroxine (SYNTHROID) 112 MCG tablet TAKE 1 TABLET BY MOUTH EVERY DAY 10/20/21  Yes Marin Olp, MD  lisinopril-hydrochlorothiazide (ZESTORETIC) 20-25 MG tablet TAKE 1 TABLET BY MOUTH EVERY DAY 01/22/21  Yes Marin Olp, MD  rosuvastatin (CRESTOR) 10 MG tablet Take 1 tablet (10 mg total) by mouth 2 (two) times a week. 11/27/21  Yes Marin Olp, MD  vitamin B-12 (CYANOCOBALAMIN) 500 MCG tablet Take 500 mcg by mouth daily.   Yes [provider]  dicyclomine (BENTYL) 20 MG tablet Take 1 tablet (20 mg total)  by mouth 2 (two) times daily. 05/05/21   Prosperi, Christian H, PA-C  metoprolol tartrate (LOPRESSOR) 50 MG tablet Take 2 tablets (100 mg total) by mouth once for 1 dose. TAKE TWO HOURS PRIOR TO  SCHEDULE CARDIAC TEST 12/12/21 12/12/21  Martinique, Peter M, MD    Current Outpatient Medications  Medication Sig Dispense Refill   ALPHA LIPOIC ACID PO Take 1 tablet by mouth daily.     anastrozole (ARIMIDEX) 1 MG tablet Take 1 tablet (1 mg total) by mouth daily. 90 tablet 4   aspirin EC 81 MG tablet Take 81 mg by mouth daily. Swallow whole.     Cholecalciferol (VITAMIN D-3 PO) Take 1,500 Units by mouth daily.     fish oil-omega-3 fatty  acids 1000 MG capsule Take 1 g by mouth daily.     fluticasone (FLONASE) 50 MCG/ACT nasal spray Place 2 sprays into both nostrils daily. 16 g 6   gabapentin (NEURONTIN) 300 MG capsule TAKE 1 CAPSULE BY MOUTH EVERY DAY AT BEDTIME 90 capsule 4   glucosamine-chondroitin 500-400 MG tablet Take 1 tablet by mouth daily.     levothyroxine (SYNTHROID) 112 MCG tablet TAKE 1 TABLET BY MOUTH EVERY DAY 90 tablet 3   lisinopril-hydrochlorothiazide (ZESTORETIC) 20-25 MG tablet TAKE 1 TABLET BY MOUTH EVERY DAY 90 tablet 3   rosuvastatin (CRESTOR) 10 MG tablet Take 1 tablet (10 mg total) by mouth 2 (two) times a week. 90 tablet 3   vitamin B-12 (CYANOCOBALAMIN) 500 MCG tablet Take 500 mcg by mouth daily.     dicyclomine (BENTYL) 20 MG tablet Take 1 tablet (20 mg total) by mouth 2 (two) times daily. 20 tablet 0   metoprolol tartrate (LOPRESSOR) 50 MG tablet Take 2 tablets (100 mg total) by mouth once for 1 dose. TAKE TWO HOURS PRIOR TO  SCHEDULE CARDIAC TEST 2 tablet 0   Current Facility-Administered Medications  Medication Dose Route Frequency Provider Last Rate Last Admin   0.9 %  sodium chloride infusion  500 mL Intravenous Once Carsten Carstarphen, Carlota Raspberry, MD        Allergies as of 12/25/2021 - Review Complete 12/25/2021  Allergen Reaction Noted   Gluten meal  11/24/2013   Wheat bran  11/24/2013   Cephalexin Hives 06/03/2007    Family History  Problem Relation Age of Onset   Hypertension Mother    Coronary artery disease Father        62   COPD Father        passed age 40   Pulmonary fibrosis Father    Idiopathic pulmonary fibrosis Father    Breast cancer Maternal Aunt    Pulmonary fibrosis Paternal Aunt    Stomach cancer Cousin        Paternal   Colon cancer Neg Hx    Esophageal cancer Neg Hx    Pancreatic cancer Neg Hx    Liver disease Neg Hx     Social History   Socioeconomic History   Marital status: Married    Spouse name: Not on file   Number of children: 2   Years of education: Not  on file   Highest education level: Not on file  Occupational History   Not on file  Tobacco Use   Smoking status: Former    Packs/day: 1.00    Years: 39.00    Total pack years: 39.00    Types: Cigarettes    Quit date: 07/13/2006    Years since quitting: 15.4   Smokeless tobacco: Never  Vaping Use   Vaping Use: Never used  Substance and Sexual Activity   Alcohol use: Yes    Comment: socially   Drug use: No   Sexual activity: Not on file  Other Topics Concern   Not on file  Social History Narrative   Married (husband patient of Dr. Yong Channel), 2 children, 1 grandchild   Her mom lives with her. Dad passed 2017- had lived with them.       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      Hobbies: [place] Strain: [place]  (07/11/2021)   Overall Financial Resource Strain (CARDIA)    Difficulty of Paying Living Expenses: Not hard at all  Food Insecurity: No Food Insecurity (07/11/2021)   Hunger Vital Sign    Worried About Running Out of Food in the Last Year: Never true    Ran Out of Food in the Last Year: Never true  Transportation Needs: No Transportation Needs (07/11/2021)   PRAPARE - Hydrologist (Medical): No    Lack of Transportation (Non-Medical): No  Physical Activity: Inactive (07/11/2021)   Exercise Vital Sign    Days of Exercise per Week: 0 days    Minutes of Exercise per Session: 0 min  Stress: No Stress Concern Present (07/11/2021)   [place]    Feeling of Stress : Not at all  Social Connections: Socially Isolated (07/11/2021)   Social Connection and Isolation Panel [NHANES]    Frequency of Communication with Friends and Family: Once a week    Frequency of Social Gatherings with Friends and Family: Once a week    Attends Religious Services: Never    Marine scientist or Organizations: No     Attends Archivist Meetings: Never    Marital Status: Married  Human resources officer Violence: Not At Risk (07/11/2021)   Humiliation, Afraid, Rape, and Kick questionnaire    Fear of Current or Ex-Partner: No    Emotionally Abused: No    Physically Abused: No    Sexually Abused: No    Review of Systems: All other review of systems negative except as mentioned in the HPI.  Physical Exam: Vital signs BP (!) 183/77   Pulse 62   Temp 97.7 F (36.5 C) (Temporal)   Ht 5' 5"  (1.651 m)   Wt 200 lb (90.7 kg)   LMP  (LMP Unknown)   SpO2 97%   BMI 33.28 kg/m   General:   Alert,  Well-developed, pleasant and cooperative in NAD Lungs:  Clear throughout to auscultation.   Heart:  Regular rate and rhythm Abdomen:  Soft, nontender and nondistended.   Neuro/Psych:  Alert and cooperative. Normal mood and affect. A and O x 3  Jolly Mango, MD [facility]

## 2021-12-25 NOTE — Op Note (Signed)
[place] Patient Name: Christine Leon Procedure Date: 12/25/2021 7:41 AM MRN: [id number] Endoscopist: Remo Lipps P. Havery Moros , MD Age: 71 Referring MD:  Date of Birth: 05-29-1951 Gender: Female Account #: [id number] Procedure:                Colonoscopy Indications:              High risk colon cancer surveillance: Personal                            history of colonic polyps - 3 adenomas removed 2018 Medicines:                Monitored Anesthesia Care Procedure:                Pre-Anesthesia Assessment:                           - Prior to the procedure, a History and Physical                            was performed, and patient medications and                            allergies were reviewed. The patient's tolerance of                            previous anesthesia was also reviewed. The risks                            and benefits of the procedure and the sedation                            options and risks were discussed with the patient.                            All questions were answered, and informed consent                            was obtained. Prior Anticoagulants: The patient has                            taken no previous anticoagulant or antiplatelet                            agents. ASA Grade Assessment: II - A patient with                            mild systemic disease. After reviewing the risks                            and benefits, the patient was deemed in                            satisfactory condition to undergo the procedure.  After obtaining informed consent, the colonoscope                            was passed under direct vision. Throughout the                            procedure, the patient's blood pressure, pulse, and                            oxygen saturations were monitored continuously. The                            Olympus PCF-H190DL (#[id number]) Colonoscope was                             introduced through the anus and advanced to the the                            cecum, identified by appendiceal orifice and                            ileocecal valve. The colonoscopy was performed                            without difficulty. The patient tolerated the                            procedure well. The quality of the bowel                            preparation was good. The ileocecal valve,                            appendiceal orifice, and rectum were photographed. Scope In: 8:41:22 AM Scope Out: 9:01:26 AM Scope Withdrawal Time: 0 hours 12 minutes 3 seconds  Total Procedure Duration: 0 hours 20 minutes 4 seconds  Findings:                 The perianal and digital rectal examinations were                            normal.                           Multiple small-mouthed diverticula were found in                            the sigmoid colon. This was associated with some                            luminal narrowing and restricted mobility of the                            sigmoid colon.  A 3 mm polyp was found in the transverse colon. The                            polyp was sessile. The polyp was removed with a                            cold snare. Resection and retrieval were complete.                           A diminutive polyp was found in the sigmoid colon.                            The polyp was sessile. The polyp was removed with a                            cold snare. Resection and retrieval were complete.                           Internal hemorrhoids were found during retroflexion.                           An area of altered mucosa was found in the distal                            rectum at the dentate line. May be normal variant                            but biopsies were taken with a cold forceps for                            histology to rule out flat adenomatous change.                           The exam was otherwise without  abnormality. Complications:            No immediate complications. Estimated blood loss:                            Minimal. Estimated Blood Loss:     Estimated blood loss was minimal. Impression:               - Diverticulosis in the sigmoid colon.                           - One 3 mm polyp in the transverse colon, removed                            with a cold snare. Resected and retrieved.                           - One diminutive polyp in the sigmoid colon,  removed with a cold snare. Resected and retrieved.                           - Internal hemorrhoids.                           - Altered mucosa in the distal rectum. Biopsied to                            ensure no adenomatous change.                           - The examination was otherwise normal. Recommendation:           - Patient has a contact number available for                            emergencies. The signs and symptoms of potential                            delayed complications were discussed with the                            patient. Return to normal activities tomorrow.                            Written discharge instructions were provided to the                            patient.                           - Resume previous diet.                           - Continue present medications.                           - Await pathology results. Remo Lipps P. Jovanne Riggenbach, MD 12/25/2021 9:09:00 AM This report has been signed electronically.

## 2021-12-25 NOTE — Progress Notes (Signed)
Called to room to assist during endoscopic procedure.  Patient ID and intended procedure confirmed with present staff. Received instructions for my participation in the procedure from the performing physician.  

## 2021-12-25 NOTE — Progress Notes (Signed)
A/ox3, pleased with MAC, report to RN 

## 2021-12-26 ENCOUNTER — Telehealth: Payer: Self-pay

## 2021-12-26 NOTE — Telephone Encounter (Signed)
Unable to leave message mailbox full.

## 2021-12-30 DIAGNOSIS — I10 Essential (primary) hypertension: Secondary | ICD-10-CM | POA: Diagnosis not present

## 2021-12-30 DIAGNOSIS — R5383 Other fatigue: Secondary | ICD-10-CM | POA: Diagnosis not present

## 2021-12-30 DIAGNOSIS — R072 Precordial pain: Secondary | ICD-10-CM | POA: Diagnosis not present

## 2021-12-30 DIAGNOSIS — R079 Chest pain, unspecified: Secondary | ICD-10-CM | POA: Diagnosis not present

## 2021-12-31 LAB — BASIC METABOLIC PANEL
BUN/Creatinine Ratio: 13 (ref 12–28)
BUN: 10 mg/dL (ref 8–27)
CO2: 25 mmol/L (ref 20–29)
Calcium: 9.9 mg/dL (ref 8.7–10.3)
Chloride: 103 mmol/L (ref 96–106)
Creatinine, Ser: 0.76 mg/dL (ref 0.57–1.00)
Glucose: 85 mg/dL (ref 70–99)
Potassium: 4.5 mmol/L (ref 3.5–5.2)
Sodium: 144 mmol/L (ref 134–144)
eGFR: 84 mL/min/{1.73_m2} (ref >59)

## 2022-01-01 ENCOUNTER — Telehealth (HOSPITAL_COMMUNITY): Payer: Self-pay | Admitting: *Deleted

## 2022-01-01 NOTE — Telephone Encounter (Signed)
Reaching out to patient to offer assistance regarding upcoming cardiac imaging study; pt verbalizes understanding of appt date/time, parking situation and where to check in, pre-test NPO status and medications ordered, and verified current allergies; name and call back number provided for further questions should they arise  Gordy Clement RN Navigator Card[facility] [place] [facility] [phone] office [phone] cell  Patient to take 12m metoprolol tartrate two hours prior to her cardiac CT scan.  She is aware to arrive at 11:30am.

## 2022-01-02 ENCOUNTER — Ambulatory Visit (HOSPITAL_COMMUNITY)
Admission: RE | Admit: 2022-01-02 | Discharge: 2022-01-02 | Disposition: A | Discharge status: Home / Self Care | Payer: Medicare HMO | Source: Ambulatory Visit | Attending: Cardiology | Admitting: Cardiology

## 2022-01-02 DIAGNOSIS — R072 Precordial pain: Secondary | ICD-10-CM | POA: Insufficient documentation

## 2022-01-02 DIAGNOSIS — R079 Chest pain, unspecified: Secondary | ICD-10-CM | POA: Insufficient documentation

## 2022-01-02 DIAGNOSIS — I10 Essential (primary) hypertension: Secondary | ICD-10-CM | POA: Diagnosis not present

## 2022-01-02 DIAGNOSIS — R5383 Other fatigue: Secondary | ICD-10-CM | POA: Insufficient documentation

## 2022-01-02 MED ORDER — NITROGLYCERIN 0.4 MG SL SUBL
SUBLINGUAL_TABLET | SUBLINGUAL | Status: AC
  Filled 2022-01-02: qty 2

## 2022-01-02 MED ORDER — IOHEXOL 350 MG/ML SOLN
100.0000 mL | Freq: Once | INTRAVENOUS | Status: AC | PRN
  Administered 2022-01-02: 100 mL via INTRAVENOUS

## 2022-01-02 MED ORDER — NITROGLYCERIN 0.4 MG SL SUBL
0.8000 mg | SUBLINGUAL_TABLET | Freq: Once | SUBLINGUAL | Status: AC
  Administered 2022-01-02: 0.8 mg via SUBLINGUAL

## 2022-01-06 ENCOUNTER — Other Ambulatory Visit: Payer: Self-pay | Admitting: Family Medicine

## 2022-01-09 ENCOUNTER — Other Ambulatory Visit: Payer: Self-pay | Admitting: Family Medicine

## 2022-02-02 ENCOUNTER — Encounter: Payer: Self-pay | Admitting: Gastroenterology
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
## 2022-03-30 ENCOUNTER — Other Ambulatory Visit: Payer: Self-pay

## 2022-03-30 ENCOUNTER — Ambulatory Visit: Payer: Medicare HMO | Admitting: Cardiology

## 2022-03-30 ENCOUNTER — Emergency Department (HOSPITAL_COMMUNITY): Payer: Medicare HMO

## 2022-03-30 ENCOUNTER — Encounter (HOSPITAL_COMMUNITY): Payer: Self-pay

## 2022-03-30 ENCOUNTER — Inpatient Hospital Stay (HOSPITAL_COMMUNITY)
Admission: EM | Admit: 2022-03-30 | Discharge: 2022-04-02 | DRG: 393 | Disposition: A | Discharge status: Home / Self Care | Payer: Medicare HMO | Attending: Internal Medicine | Admitting: Internal Medicine

## 2022-03-30 DIAGNOSIS — E876 Hypokalemia: Secondary | ICD-10-CM | POA: Diagnosis not present

## 2022-03-30 DIAGNOSIS — Z825 Family history of asthma and other chronic lower respiratory diseases: Secondary | ICD-10-CM

## 2022-03-30 DIAGNOSIS — Z20822 Contact with and (suspected) exposure to covid-19: Secondary | ICD-10-CM | POA: Diagnosis present

## 2022-03-30 DIAGNOSIS — R1030 Lower abdominal pain, unspecified: Secondary | ICD-10-CM | POA: Diagnosis not present

## 2022-03-30 DIAGNOSIS — E860 Dehydration: Secondary | ICD-10-CM | POA: Diagnosis present

## 2022-03-30 DIAGNOSIS — Z8249 Family history of ischemic heart disease and other diseases of the circulatory system: Secondary | ICD-10-CM

## 2022-03-30 DIAGNOSIS — Z87891 Personal history of nicotine dependence: Secondary | ICD-10-CM

## 2022-03-30 DIAGNOSIS — Z91018 Allergy to other foods: Secondary | ICD-10-CM

## 2022-03-30 DIAGNOSIS — Z923 Personal history of irradiation: Secondary | ICD-10-CM

## 2022-03-30 DIAGNOSIS — K529 Noninfective gastroenteritis and colitis, unspecified: Secondary | ICD-10-CM | POA: Diagnosis present

## 2022-03-30 DIAGNOSIS — K921 Melena: Secondary | ICD-10-CM | POA: Diagnosis present

## 2022-03-30 DIAGNOSIS — K5731 Diverticulosis of large intestine without perforation or abscess with bleeding: Secondary | ICD-10-CM | POA: Diagnosis present

## 2022-03-30 DIAGNOSIS — R110 Nausea: Secondary | ICD-10-CM

## 2022-03-30 DIAGNOSIS — K900 Celiac disease: Secondary | ICD-10-CM | POA: Diagnosis not present

## 2022-03-30 DIAGNOSIS — E039 Hypothyroidism, unspecified: Secondary | ICD-10-CM | POA: Diagnosis present

## 2022-03-30 DIAGNOSIS — R42 Dizziness and giddiness: Secondary | ICD-10-CM | POA: Diagnosis not present

## 2022-03-30 DIAGNOSIS — R55 Syncope and collapse: Secondary | ICD-10-CM | POA: Diagnosis not present

## 2022-03-30 DIAGNOSIS — Z743 Need for continuous supervision: Secondary | ICD-10-CM | POA: Diagnosis not present

## 2022-03-30 DIAGNOSIS — R1084 Generalized abdominal pain: Secondary | ICD-10-CM | POA: Diagnosis not present

## 2022-03-30 DIAGNOSIS — Z86010 Personal history of colonic polyps: Secondary | ICD-10-CM

## 2022-03-30 DIAGNOSIS — Z7982 Long term (current) use of aspirin: Secondary | ICD-10-CM

## 2022-03-30 DIAGNOSIS — Z79899 Other long term (current) drug therapy: Secondary | ICD-10-CM

## 2022-03-30 DIAGNOSIS — Z79890 Hormone replacement therapy (postmenopausal): Secondary | ICD-10-CM

## 2022-03-30 DIAGNOSIS — J9811 Atelectasis: Secondary | ICD-10-CM | POA: Diagnosis present

## 2022-03-30 DIAGNOSIS — A09 Infectious gastroenteritis and colitis, unspecified: Secondary | ICD-10-CM | POA: Diagnosis present

## 2022-03-30 DIAGNOSIS — I10 Essential (primary) hypertension: Secondary | ICD-10-CM | POA: Diagnosis not present

## 2022-03-30 DIAGNOSIS — R4182 Altered mental status, unspecified: Secondary | ICD-10-CM | POA: Diagnosis not present

## 2022-03-30 DIAGNOSIS — R109 Unspecified abdominal pain: Secondary | ICD-10-CM | POA: Diagnosis not present

## 2022-03-30 DIAGNOSIS — Z853 Personal history of malignant neoplasm of breast: Secondary | ICD-10-CM

## 2022-03-30 DIAGNOSIS — Z8719 Personal history of other diseases of the digestive system: Secondary | ICD-10-CM

## 2022-03-30 DIAGNOSIS — R1114 Bilious vomiting: Secondary | ICD-10-CM

## 2022-03-30 DIAGNOSIS — R519 Headache, unspecified: Secondary | ICD-10-CM | POA: Diagnosis not present

## 2022-03-30 DIAGNOSIS — Z803 Family history of malignant neoplasm of breast: Secondary | ICD-10-CM

## 2022-03-30 DIAGNOSIS — E669 Obesity, unspecified: Secondary | ICD-10-CM | POA: Diagnosis present

## 2022-03-30 DIAGNOSIS — I700 Atherosclerosis of aorta: Secondary | ICD-10-CM | POA: Diagnosis not present

## 2022-03-30 DIAGNOSIS — K760 Fatty (change of) liver, not elsewhere classified: Secondary | ICD-10-CM | POA: Diagnosis not present

## 2022-03-30 DIAGNOSIS — R0902 Hypoxemia: Secondary | ICD-10-CM | POA: Diagnosis not present

## 2022-03-30 DIAGNOSIS — Z6832 Body mass index (BMI) 32.0-32.9, adult: Secondary | ICD-10-CM

## 2022-03-30 DIAGNOSIS — K559 Vascular disorder of intestine, unspecified: Principal | ICD-10-CM | POA: Diagnosis present

## 2022-03-30 DIAGNOSIS — Z888 Allergy status to other drugs, medicaments and biological substances status: Secondary | ICD-10-CM

## 2022-03-30 DIAGNOSIS — R933 Abnormal findings on diagnostic imaging of other parts of digestive tract: Secondary | ICD-10-CM

## 2022-03-30 LAB — COMPREHENSIVE METABOLIC PANEL
ALT: 41 U/L (ref 0–44)
AST: 38 U/L (ref 15–41)
Albumin: 4.1 g/dL (ref 3.5–5.0)
Alkaline Phosphatase: 77 U/L (ref 38–126)
Anion gap: 10 (ref 5–15)
BUN: 12 mg/dL (ref 8–23)
CO2: 26 mmol/L (ref 22–32)
Calcium: 9.1 mg/dL (ref 8.9–10.3)
Chloride: 107 mmol/L (ref 98–111)
Creatinine, Ser: 0.89 mg/dL (ref 0.44–1.00)
GFR, Estimated: >60 mL/min (ref >60)
Glucose, Bld: 104 mg/dL — ABNORMAL HIGH (ref 70–99)
Potassium: 3.6 mmol/L (ref 3.5–5.1)
Sodium: 143 mmol/L (ref 135–145)
Total Bilirubin: 0.8 mg/dL (ref 0.3–1.2)
Total Protein: 6.8 g/dL (ref 6.5–8.1)

## 2022-03-30 LAB — CBC WITH DIFFERENTIAL/PLATELET
Abs Immature Granulocytes: 0.04 10*3/uL (ref 0.00–0.07)
Basophils Absolute: 0 10*3/uL (ref 0.0–0.1)
Basophils Relative: 0 %
Eosinophils Absolute: 0 10*3/uL (ref 0.0–0.5)
Eosinophils Relative: 0 %
HCT: 45.6 % (ref 36.0–46.0)
Hemoglobin: 15.4 g/dL — ABNORMAL HIGH (ref 12.0–15.0)
Immature Granulocytes: 0 %
Lymphocytes Relative: 12 %
Lymphs Abs: 1.4 10*3/uL (ref 0.7–4.0)
MCH: 30.3 pg (ref 26.0–34.0)
MCHC: 33.8 g/dL (ref 30.0–36.0)
MCV: 89.6 fL (ref 80.0–100.0)
Monocytes Absolute: 0.5 10*3/uL (ref 0.1–1.0)
Monocytes Relative: 4 %
Neutro Abs: 9.1 10*3/uL — ABNORMAL HIGH (ref 1.7–7.7)
Neutrophils Relative %: 84 %
Platelets: 272 10*3/uL (ref 150–400)
RBC: 5.09 MIL/uL (ref 3.87–5.11)
RDW: 13.5 % (ref 11.5–15.5)
WBC: 11.1 10*3/uL — ABNORMAL HIGH (ref 4.0–10.5)
nRBC: 0 % (ref 0.0–0.2)

## 2022-03-30 LAB — CBG MONITORING, ED: Glucose-Capillary: 156 mg/dL — ABNORMAL HIGH (ref 70–99)

## 2022-03-30 LAB — RESP PANEL BY RT-PCR (FLU A&B, COVID) ARPGX2
Influenza A by PCR: NEGATIVE
Influenza B by PCR: NEGATIVE
SARS Coronavirus 2 by RT PCR: NEGATIVE

## 2022-03-30 LAB — LACTIC ACID, PLASMA: Lactic Acid, Venous: 2.8 mmol/L (ref 0.5–1.9)

## 2022-03-30 LAB — TYPE AND SCREEN
ABO/RH(D): A POS
Antibody Screen: NEGATIVE

## 2022-03-30 LAB — TROPONIN I (HIGH SENSITIVITY): Troponin I (High Sensitivity): 3 ng/L (ref <18)

## 2022-03-30 LAB — LIPASE, BLOOD: Lipase: 182 U/L — ABNORMAL HIGH (ref 11–51)

## 2022-03-30 MED ORDER — SODIUM CHLORIDE 0.9 % IV BOLUS
1000.0000 mL | Freq: Once | INTRAVENOUS | Status: AC
Start: 2022-03-30 — End: 2022-03-30
  Administered 2022-03-30: 1000 mL via INTRAVENOUS

## 2022-03-30 MED ORDER — ONDANSETRON HCL 4 MG/2ML IJ SOLN
4.0000 mg | Freq: Four times a day (QID) | INTRAMUSCULAR | Status: DC | PRN
  Administered 2022-03-30 – 2022-04-01 (×4): 4 mg via INTRAVENOUS
  Filled 2022-03-30 (×4): qty 2

## 2022-03-30 MED ORDER — ONDANSETRON HCL 4 MG PO TABS: 4.0000 mg | ORAL_TABLET | Freq: Four times a day (QID) | ORAL | Status: DC | PRN

## 2022-03-30 MED ORDER — MORPHINE SULFATE (PF) 4 MG/ML IV SOLN
4.0000 mg | Freq: Once | INTRAVENOUS | Status: AC
  Administered 2022-03-30: 4 mg via INTRAVENOUS
  Filled 2022-03-30: qty 1

## 2022-03-30 MED ORDER — LACTATED RINGERS IV SOLN: INTRAVENOUS | Status: DC

## 2022-03-30 MED ORDER — MORPHINE SULFATE (PF) 2 MG/ML IV SOLN: 2.0000 mg | INTRAVENOUS | Status: DC | PRN

## 2022-03-30 MED ORDER — ONDANSETRON HCL 4 MG/2ML IJ SOLN
4.0000 mg | Freq: Once | INTRAMUSCULAR | Status: AC
  Administered 2022-03-30: 4 mg via INTRAVENOUS
  Filled 2022-03-30: qty 2

## 2022-03-30 MED ORDER — ACETAMINOPHEN 650 MG RE SUPP: 650.0000 mg | Freq: Four times a day (QID) | RECTAL | Status: DC | PRN

## 2022-03-30 MED ORDER — SODIUM CHLORIDE (PF) 0.9 % IJ SOLN
INTRAMUSCULAR | Status: AC
  Filled 2022-03-30: qty 50

## 2022-03-30 MED ORDER — CIPROFLOXACIN IN D5W 400 MG/200ML IV SOLN
400.0000 mg | Freq: Once | INTRAVENOUS | Status: AC
  Administered 2022-03-30: 400 mg via INTRAVENOUS
  Filled 2022-03-30: qty 200

## 2022-03-30 MED ORDER — SODIUM CHLORIDE 0.9% FLUSH
3.0000 mL | Freq: Two times a day (BID) | INTRAVENOUS | Status: DC
  Administered 2022-03-30 – 2022-04-02 (×5): 3 mL via INTRAVENOUS

## 2022-03-30 MED ORDER — IOHEXOL 300 MG/ML  SOLN
100.0000 mL | Freq: Once | INTRAMUSCULAR | Status: AC | PRN
  Administered 2022-03-30: 100 mL via INTRAVENOUS

## 2022-03-30 MED ORDER — ACETAMINOPHEN 325 MG PO TABS
650.0000 mg | ORAL_TABLET | Freq: Four times a day (QID) | ORAL | Status: DC | PRN
  Administered 2022-03-31 (×3): 650 mg via ORAL
  Filled 2022-03-30 (×3): qty 2

## 2022-03-30 MED ORDER — METRONIDAZOLE 500 MG/100ML IV SOLN
500.0000 mg | Freq: Once | INTRAVENOUS | Status: AC
Start: 2022-03-30 — End: 2022-03-30
  Administered 2022-03-30: 500 mg via INTRAVENOUS
  Filled 2022-03-30: qty 100

## 2022-03-30 MED ORDER — LACTATED RINGERS IV BOLUS
500.0000 mL | Freq: Once | INTRAVENOUS | Status: AC
  Administered 2022-03-30: 500 mL via INTRAVENOUS

## 2022-03-30 NOTE — Assessment & Plan Note (Signed)
Calcium score of 7 on CT coronaries in June. First trop neg, second pending. But overall seems less likely that todays syncope was related to CAD and more likely vasovagal symptoms due to nausea related to onset of colitis with bloody diarrhea onset in ED. 1. Syncope pathway 2. Tele monitor 3. 2d echo 4. IVF

## 2022-03-30 NOTE — ED Notes (Signed)
Several attempts to draw back from IV, unsuccessful IV attempts
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
## 2022-03-30 NOTE — Assessment & Plan Note (Signed)
Colitis with hematochezia.  ? If she accidentally was exposed to Gluten?

## 2022-03-30 NOTE — Assessment & Plan Note (Deleted)
Calcium score of 7 on CT coronaries in June. First trop neg, second pending. But overall seems less likely that todays syncope was related to CAD and more likely vasovagal symptoms due to nausea related to onset of colitis with bloody diarrhea onset in ED.

## 2022-03-30 NOTE — Assessment & Plan Note (Addendum)
With hematochezia and nausea, onset suddenly this afternoon and in ED. Infectious vs inflammatory from celiac? Check lactate as well, but rads not calling ischemic. 1. IVF 2. GI pathogen pnl 3. COVID test pending 4. Got cipro/flagyl in ED, will hold off on ordering further ABx for the moment 5. Procalcitonin 6. Repeat CBC/BMP in AM
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
## 2022-03-30 NOTE — Progress Notes (Signed)
Date and time results received: 03/30/22 2350 (use smartphrase ".now" to insert current time)  Test: Lactic Acid Critical Value: 2.8  Name of Provider Notified: Lorra Hals  Orders Received? Or Actions Taken?: Awaiting orders

## 2022-03-30 NOTE — Assessment & Plan Note (Signed)
Med rec pending. 1. Hold any rate reducing meds (ie BBs) given syncope with nausea that I suspect may have been vasovagal 2. Will hold off on ordering lisinopril-HCTZ (if still on this) for the moment and see how she does overnight.

## 2022-03-31 ENCOUNTER — Inpatient Hospital Stay (HOSPITAL_COMMUNITY): Payer: Medicare HMO

## 2022-03-31 DIAGNOSIS — E669 Obesity, unspecified: Secondary | ICD-10-CM | POA: Diagnosis not present

## 2022-03-31 DIAGNOSIS — J9811 Atelectasis: Secondary | ICD-10-CM | POA: Diagnosis not present

## 2022-03-31 DIAGNOSIS — Z8249 Family history of ischemic heart disease and other diseases of the circulatory system: Secondary | ICD-10-CM | POA: Diagnosis not present

## 2022-03-31 DIAGNOSIS — R1114 Bilious vomiting: Secondary | ICD-10-CM | POA: Diagnosis not present

## 2022-03-31 DIAGNOSIS — R933 Abnormal findings on diagnostic imaging of other parts of digestive tract: Secondary | ICD-10-CM

## 2022-03-31 DIAGNOSIS — Z8719 Personal history of other diseases of the digestive system: Secondary | ICD-10-CM | POA: Diagnosis not present

## 2022-03-31 DIAGNOSIS — R110 Nausea: Secondary | ICD-10-CM | POA: Diagnosis not present

## 2022-03-31 DIAGNOSIS — E039 Hypothyroidism, unspecified: Secondary | ICD-10-CM | POA: Diagnosis not present

## 2022-03-31 DIAGNOSIS — Z79890 Hormone replacement therapy (postmenopausal): Secondary | ICD-10-CM | POA: Diagnosis not present

## 2022-03-31 DIAGNOSIS — I10 Essential (primary) hypertension: Secondary | ICD-10-CM | POA: Diagnosis not present

## 2022-03-31 DIAGNOSIS — Z86010 Personal history of colonic polyps: Secondary | ICD-10-CM | POA: Diagnosis not present

## 2022-03-31 DIAGNOSIS — K921 Melena: Secondary | ICD-10-CM | POA: Diagnosis not present

## 2022-03-31 DIAGNOSIS — E876 Hypokalemia: Secondary | ICD-10-CM | POA: Diagnosis not present

## 2022-03-31 DIAGNOSIS — K559 Vascular disorder of intestine, unspecified: Secondary | ICD-10-CM

## 2022-03-31 DIAGNOSIS — R55 Syncope and collapse: Secondary | ICD-10-CM

## 2022-03-31 DIAGNOSIS — Z803 Family history of malignant neoplasm of breast: Secondary | ICD-10-CM | POA: Diagnosis not present

## 2022-03-31 DIAGNOSIS — E860 Dehydration: Secondary | ICD-10-CM | POA: Diagnosis not present

## 2022-03-31 DIAGNOSIS — Z825 Family history of asthma and other chronic lower respiratory diseases: Secondary | ICD-10-CM | POA: Diagnosis not present

## 2022-03-31 DIAGNOSIS — A09 Infectious gastroenteritis and colitis, unspecified: Secondary | ICD-10-CM | POA: Diagnosis not present

## 2022-03-31 DIAGNOSIS — Z923 Personal history of irradiation: Secondary | ICD-10-CM | POA: Diagnosis not present

## 2022-03-31 DIAGNOSIS — Z91018 Allergy to other foods: Secondary | ICD-10-CM | POA: Diagnosis not present

## 2022-03-31 DIAGNOSIS — K529 Noninfective gastroenteritis and colitis, unspecified: Secondary | ICD-10-CM | POA: Diagnosis not present

## 2022-03-31 DIAGNOSIS — K900 Celiac disease: Secondary | ICD-10-CM | POA: Diagnosis not present

## 2022-03-31 DIAGNOSIS — K5731 Diverticulosis of large intestine without perforation or abscess with bleeding: Secondary | ICD-10-CM | POA: Diagnosis not present

## 2022-03-31 DIAGNOSIS — Z7982 Long term (current) use of aspirin: Secondary | ICD-10-CM | POA: Diagnosis not present

## 2022-03-31 DIAGNOSIS — Z888 Allergy status to other drugs, medicaments and biological substances status: Secondary | ICD-10-CM | POA: Diagnosis not present

## 2022-03-31 DIAGNOSIS — Z20822 Contact with and (suspected) exposure to covid-19: Secondary | ICD-10-CM | POA: Diagnosis not present

## 2022-03-31 DIAGNOSIS — Z87891 Personal history of nicotine dependence: Secondary | ICD-10-CM | POA: Diagnosis not present

## 2022-03-31 DIAGNOSIS — Z853 Personal history of malignant neoplasm of breast: Secondary | ICD-10-CM | POA: Diagnosis not present

## 2022-03-31 DIAGNOSIS — Z6832 Body mass index (BMI) 32.0-32.9, adult: Secondary | ICD-10-CM | POA: Diagnosis not present

## 2022-03-31 LAB — GLUCOSE, CAPILLARY: Glucose-Capillary: 114 mg/dL — ABNORMAL HIGH (ref 70–99)

## 2022-03-31 LAB — ECHOCARDIOGRAM COMPLETE
AR max vel: 2.14 cm2
AV Area VTI: 2.16 cm2
AV Area mean vel: 2.17 cm2
AV Mean grad: 4 mmHg
AV Peak grad: 8.3 mmHg
Ao pk vel: 1.44 m/s
Area-P 1/2: 3.72 cm2
Calc EF: 58.2 %
Height: 65 in
MV M vel: 2.55 m/s
MV Peak grad: 25.9 mmHg
S' Lateral: 2.6 cm
Single Plane A2C EF: 56.9 %
Single Plane A4C EF: 60 %
Weight: 3199.32 oz

## 2022-03-31 LAB — BASIC METABOLIC PANEL
Anion gap: 9 (ref 5–15)
BUN: 11 mg/dL (ref 8–23)
CO2: 24 mmol/L (ref 22–32)
Calcium: 8.9 mg/dL (ref 8.9–10.3)
Chloride: 105 mmol/L (ref 98–111)
Creatinine, Ser: 0.72 mg/dL (ref 0.44–1.00)
GFR, Estimated: >60 mL/min (ref >60)
Glucose, Bld: 119 mg/dL — ABNORMAL HIGH (ref 70–99)
Potassium: 3.9 mmol/L (ref 3.5–5.1)
Sodium: 138 mmol/L (ref 135–145)

## 2022-03-31 LAB — CBC
HCT: 43.2 % (ref 36.0–46.0)
Hemoglobin: 14.6 g/dL (ref 12.0–15.0)
MCH: 30.6 pg (ref 26.0–34.0)
MCHC: 33.8 g/dL (ref 30.0–36.0)
MCV: 90.6 fL (ref 80.0–100.0)
Platelets: 214 10*3/uL (ref 150–400)
RBC: 4.77 MIL/uL (ref 3.87–5.11)
RDW: 13.6 % (ref 11.5–15.5)
WBC: 13.9 10*3/uL — ABNORMAL HIGH (ref 4.0–10.5)
nRBC: 0 % (ref 0.0–0.2)

## 2022-03-31 LAB — ABO/RH: ABO/RH(D): A POS

## 2022-03-31 LAB — PROCALCITONIN: Procalcitonin: <0.1 ng/mL

## 2022-03-31 LAB — LACTIC ACID, PLASMA
Lactic Acid, Venous: 2.5 mmol/L (ref 0.5–1.9)
Lactic Acid, Venous: 1.5 mmol/L (ref 0.5–1.9)

## 2022-03-31 MED ORDER — METRONIDAZOLE 500 MG/100ML IV SOLN
500.0000 mg | Freq: Two times a day (BID) | INTRAVENOUS | Status: DC
  Administered 2022-03-31 – 2022-04-02 (×5): 500 mg via INTRAVENOUS
  Filled 2022-03-31 (×5): qty 100

## 2022-03-31 MED ORDER — SODIUM CHLORIDE 0.9 % IV BOLUS
500.0000 mL | Freq: Once | INTRAVENOUS | Status: AC
  Administered 2022-03-31: 500 mL via INTRAVENOUS

## 2022-03-31 MED ORDER — CIPROFLOXACIN IN D5W 400 MG/200ML IV SOLN
400.0000 mg | Freq: Two times a day (BID) | INTRAVENOUS | Status: DC
  Administered 2022-03-31 – 2022-04-02 (×5): 400 mg via INTRAVENOUS
  Filled 2022-03-31 (×5): qty 200

## 2022-03-31 MED ORDER — HYDROMORPHONE HCL 1 MG/ML IJ SOLN
0.5000 mg | INTRAMUSCULAR | Status: DC | PRN
  Administered 2022-03-31 – 2022-04-01 (×2): 0.5 mg via INTRAVENOUS
  Filled 2022-03-31 (×2): qty 0.5
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
## 2022-03-31 NOTE — Progress Notes (Signed)
  Echocardiogram 2D Echocardiogram has been performed.  Christine Leon 03/31/2022, 3:04 PM

## 2022-03-31 NOTE — Progress Notes (Signed)
Date and time results received: 03/31/22 0232 (use smartphrase ".now" to insert current time)  Test: Lactic acid Critical Value: 2.5  Name of Provider Notified: Lorra Hals  Orders Received? Or Actions Taken?: Awaiting orders
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
## 2022-04-01 DIAGNOSIS — R1114 Bilious vomiting: Secondary | ICD-10-CM

## 2022-04-01 DIAGNOSIS — K559 Vascular disorder of intestine, unspecified: Secondary | ICD-10-CM | POA: Diagnosis not present

## 2022-04-01 DIAGNOSIS — K921 Melena: Secondary | ICD-10-CM

## 2022-04-01 DIAGNOSIS — K529 Noninfective gastroenteritis and colitis, unspecified: Secondary | ICD-10-CM | POA: Diagnosis not present

## 2022-04-01 LAB — CBC WITH DIFFERENTIAL/PLATELET
Abs Immature Granulocytes: 0.06 10*3/uL (ref 0.00–0.07)
Basophils Absolute: 0 10*3/uL (ref 0.0–0.1)
Basophils Relative: 0 %
Eosinophils Absolute: 0.1 10*3/uL (ref 0.0–0.5)
Eosinophils Relative: 1 %
HCT: 36.8 % (ref 36.0–46.0)
Hemoglobin: 12.6 g/dL (ref 12.0–15.0)
Immature Granulocytes: 1 %
Lymphocytes Relative: 22 %
Lymphs Abs: 2.2 10*3/uL (ref 0.7–4.0)
MCH: 30.4 pg (ref 26.0–34.0)
MCHC: 34.2 g/dL (ref 30.0–36.0)
MCV: 88.7 fL (ref 80.0–100.0)
Monocytes Absolute: 0.5 10*3/uL (ref 0.1–1.0)
Monocytes Relative: 5 %
Neutro Abs: 7.2 10*3/uL (ref 1.7–7.7)
Neutrophils Relative %: 71 %
Platelets: 214 10*3/uL (ref 150–400)
RBC: 4.15 MIL/uL (ref 3.87–5.11)
RDW: 13.5 % (ref 11.5–15.5)
WBC: 10.1 10*3/uL (ref 4.0–10.5)
nRBC: 0 % (ref 0.0–0.2)

## 2022-04-01 LAB — BASIC METABOLIC PANEL
Anion gap: 8 (ref 5–15)
BUN: 8 mg/dL (ref 8–23)
CO2: 28 mmol/L (ref 22–32)
Calcium: 8.9 mg/dL (ref 8.9–10.3)
Chloride: 108 mmol/L (ref 98–111)
Creatinine, Ser: 0.65 mg/dL (ref 0.44–1.00)
GFR, Estimated: >60 mL/min (ref >60)
Glucose, Bld: 95 mg/dL (ref 70–99)
Potassium: 3.2 mmol/L — ABNORMAL LOW (ref 3.5–5.1)
Sodium: 144 mmol/L (ref 135–145)

## 2022-04-01 LAB — GASTROINTESTINAL PANEL BY PCR, STOOL (REPLACES STOOL CULTURE)

## 2022-04-01 LAB — HEMOGLOBIN AND HEMATOCRIT, BLOOD
HCT: 40.1 % (ref 36.0–46.0)
Hemoglobin: 13.8 g/dL (ref 12.0–15.0)

## 2022-04-01 LAB — GLUCOSE, CAPILLARY: Glucose-Capillary: 93 mg/dL (ref 70–99)

## 2022-04-01 LAB — MAGNESIUM: Magnesium: 1.6 mg/dL — ABNORMAL LOW (ref 1.7–2.4)

## 2022-04-01 LAB — PHOSPHORUS: Phosphorus: 2.8 mg/dL (ref 2.5–4.6)

## 2022-04-01 MED ORDER — PROCHLORPERAZINE EDISYLATE 10 MG/2ML IJ SOLN
10.0000 mg | Freq: Four times a day (QID) | INTRAMUSCULAR | Status: DC | PRN
  Administered 2022-04-01 – 2022-04-02 (×3): 10 mg via INTRAVENOUS
  Filled 2022-04-01 (×3): qty 2

## 2022-04-01 MED ORDER — SODIUM CHLORIDE 0.9 % IV SOLN: INTRAVENOUS | Status: DC

## 2022-04-01 MED ORDER — POTASSIUM CHLORIDE CRYS ER 20 MEQ PO TBCR
40.0000 meq | EXTENDED_RELEASE_TABLET | Freq: Once | ORAL | Status: AC
  Administered 2022-04-01: 40 meq via ORAL
  Filled 2022-04-01: qty 2

## 2022-04-01 MED ORDER — MAGNESIUM SULFATE 2 GM/50ML IV SOLN
2.0000 g | Freq: Once | INTRAVENOUS | Status: AC
  Administered 2022-04-01: 2 g via INTRAVENOUS
  Filled 2022-04-01: qty 50

## 2022-04-01 NOTE — Progress Notes (Signed)
2232 IV consult placed for PIV. IV infiltrated.  0200 hospitalist notified of BP 176/81 antihypertensives are on hold at this time, see mar for new orders.
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
## 2022-04-01 NOTE — Progress Notes (Signed)
Mobility Specialist - Progress Note   04/01/22 1027  Mobility  Activity Refused mobility   Pt refused mobility due to nausea, will check back in if schedule permits.   Roderick Pee Mobility Specialist

## 2022-04-02 DIAGNOSIS — K529 Noninfective gastroenteritis and colitis, unspecified: Secondary | ICD-10-CM | POA: Diagnosis not present

## 2022-04-02 DIAGNOSIS — K559 Vascular disorder of intestine, unspecified: Secondary | ICD-10-CM | POA: Diagnosis not present

## 2022-04-02 DIAGNOSIS — R933 Abnormal findings on diagnostic imaging of other parts of digestive tract: Secondary | ICD-10-CM | POA: Diagnosis not present

## 2022-04-02 DIAGNOSIS — R110 Nausea: Secondary | ICD-10-CM

## 2022-04-02 LAB — CBC
HCT: 37.3 % (ref 36.0–46.0)
Hemoglobin: 12.7 g/dL (ref 12.0–15.0)
MCH: 30.2 pg (ref 26.0–34.0)
MCHC: 34 g/dL (ref 30.0–36.0)
MCV: 88.8 fL (ref 80.0–100.0)
Platelets: 219 10*3/uL (ref 150–400)
RBC: 4.2 MIL/uL (ref 3.87–5.11)
RDW: 13.5 % (ref 11.5–15.5)
WBC: 9.5 10*3/uL (ref 4.0–10.5)
nRBC: 0 % (ref 0.0–0.2)

## 2022-04-02 LAB — BASIC METABOLIC PANEL
Anion gap: 6 (ref 5–15)
BUN: 7 mg/dL — ABNORMAL LOW (ref 8–23)
CO2: 24 mmol/L (ref 22–32)
Calcium: 8.6 mg/dL — ABNORMAL LOW (ref 8.9–10.3)
Chloride: 112 mmol/L — ABNORMAL HIGH (ref 98–111)
Creatinine, Ser: 0.68 mg/dL (ref 0.44–1.00)
GFR, Estimated: >60 mL/min (ref >60)
Glucose, Bld: 98 mg/dL (ref 70–99)
Potassium: 3.3 mmol/L — ABNORMAL LOW (ref 3.5–5.1)
Sodium: 142 mmol/L (ref 135–145)

## 2022-04-02 LAB — GLUCOSE, CAPILLARY: Glucose-Capillary: 92 mg/dL (ref 70–99)

## 2022-04-02 LAB — C DIFFICILE QUICK SCREEN W PCR REFLEX
C Diff antigen: NEGATIVE
C Diff interpretation: NOT DETECTED
C Diff toxin: NEGATIVE

## 2022-04-02 MED ORDER — MAGNESIUM OXIDE 400 MG PO CAPS: 400.0000 mg | ORAL_CAPSULE | Freq: Every day | ORAL | 0 refills | Status: AC

## 2022-04-02 MED ORDER — HYDRALAZINE HCL 20 MG/ML IJ SOLN
5.0000 mg | Freq: Once | INTRAMUSCULAR | Status: AC
  Administered 2022-04-02: 5 mg via INTRAVENOUS
  Filled 2022-04-02: qty 1

## 2022-04-02 MED ORDER — POTASSIUM CHLORIDE CRYS ER 20 MEQ PO TBCR
40.0000 meq | EXTENDED_RELEASE_TABLET | Freq: Once | ORAL | Status: AC
  Administered 2022-04-02: 40 meq via ORAL
  Filled 2022-04-02: qty 2

## 2022-04-02 MED ORDER — POTASSIUM CHLORIDE CRYS ER 20 MEQ PO TBCR
40.0000 meq | EXTENDED_RELEASE_TABLET | Freq: Once | ORAL | Status: AC
  Administered 2022-04-02: 40 meq via ORAL

## 2022-04-02 MED ORDER — CIPROFLOXACIN HCL 500 MG PO TABS: 500.0000 mg | ORAL_TABLET | Freq: Two times a day (BID) | ORAL | 0 refills | Status: AC

## 2022-04-02 MED ORDER — LISINOPRIL 20 MG PO TABS
20.0000 mg | ORAL_TABLET | Freq: Every day | ORAL | Status: DC
  Administered 2022-04-02: 20 mg via ORAL
  Filled 2022-04-02: qty 1

## 2022-04-02 MED ORDER — MAGNESIUM SULFATE 2 GM/50ML IV SOLN
2.0000 g | Freq: Once | INTRAVENOUS | Status: AC
  Administered 2022-04-02: 2 g via INTRAVENOUS
  Filled 2022-04-02: qty 50

## 2022-04-02 MED ORDER — ONDANSETRON HCL 4 MG PO TABS: 4.0000 mg | ORAL_TABLET | Freq: Four times a day (QID) | ORAL | 0 refills | Status: DC | PRN

## 2022-04-02 MED ORDER — METRONIDAZOLE 500 MG/100ML IV SOLN: 500.0000 mg | Freq: Two times a day (BID) | INTRAVENOUS | 0 refills | Status: DC

## 2022-04-02 MED ORDER — GABAPENTIN 100 MG PO CAPS: 100.0000 mg | ORAL_CAPSULE | Freq: Two times a day (BID) | ORAL | Status: DC | PRN

## 2022-04-02 NOTE — Progress Notes (Signed)
Discharge instructions given to patient and all questions were answered.  

## 2022-04-02 NOTE — Progress Notes (Signed)
Mobility Specialist - Progress Note   04/02/22 1102  Mobility  Activity Ambulated with assistance in hallway  Level of Assistance Modified independent, requires aide device or extra time  Assistive Device None  Distance Ambulated (ft) 400 ft  Activity Response Tolerated well  $Mobility charge 1 Mobility   Pt received in bed and agreed for mobility, no c/o pain nor discomfort during ambulation. Pt returned to bed with all needs met and family in room.   Roderick Pee Mobility Specialist

## 2022-04-02 NOTE — Progress Notes (Signed)
[facility] Progress Note  CC:  Hematochezia, possible ischemic colitis Primary Gastroenterologist:  Dr. Havery Moros  Subjective:  Patient states last bowel movement was Tuesday morning, has not had another 1 since that time but has been on liquid diet.   She states she is hungry, has been on clear liquids tolerating well without any nausea vomiting or abdominal pain.   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   Is been up and walking without any issues.  No fevers or chills.    Objective:  Vital signs in last 24 hours: Temp:  [97.5 F (36.4 C)-98 F (36.7 C)] 97.7 F (36.5 C) (09/21 0606) Pulse Rate:  [72-86] 74 (09/21 0606) Resp:  [16-20] 20 (09/21 0606) BP: (152-176)/(59-81) 155/74 (09/21 0606) SpO2:  [94 %-96 %] 95 % (09/21 0606) Weight:  [88.5 kg] 88.5 kg (09/21 0700) Last BM Date : 03/31/22  General:  71 year old female in no acute distress. Heart: Regular rate and rhythm, no murmurs. Pulm: Breath sounds clear throughout. Abdomen: Soft, nondistended. Non tender obese AB. Hypoactive bowel sounds to all 4 quadrants. Extremities:  Without edema. Neurologic:  Alert and  oriented x 4. Grossly normal neurologically. Psych:  Alert and cooperative. Normal mood and affect.  Intake/Output from previous day: 09/20 0701 - 09/21 0700 In: 2260.1 [P.O.:1948; I.V.:63.1; IV Piggyback:250] Out: 900 [Urine:900] Intake/Output this shift: No intake/output data recorded.  Lab Results: Recent Labs    03/31/22 0136 04/01/22 0605 04/01/22 1130 04/02/22 0559  WBC 13.9* 10.1  --  9.5  HGB 14.6 12.6 13.8 12.7  HCT 43.2 36.8 40.1 37.3  PLT 214 214  --  219   BMET Recent Labs    03/31/22 0136 04/01/22 0605 04/02/22 0559  NA 138 144 142  K 3.9 3.2* 3.3*  CL 105 108 112*  CO2 24 28 24   GLUCOSE 119* 95 98  BUN 11 8 7*  CREATININE 0.72 0.65 0.68   CALCIUM 8.9 8.9 8.6*   LFT Recent Labs    03/30/22 1745  PROT 6.8  ALBUMIN 4.1  AST 38  ALT 41  ALKPHOS 77  BILITOT 0.8   PT/INR No results for input(s): "LABPROT", "INR" in the last 72 hours. Hepatitis Panel No results for input(s): "HEPBSAG", "HCVAB", "HEPAIGM", "HEPBIGM" in the last 72 hours.  ECHOCARDIOGRAM COMPLETE  Result Date: 03/31/2022    ECHOCARDIOGRAM REPORT   Patient Name:   Christine Leon [facility] Date of Exam: 03/31/2022 Medical Rec #:  [id number]             Height:       65.0 in Accession #:    [id number]            Weight:       200.0 lb Date of Birth:  Nov 23, 1950             BSA:          1.978 m Patient Age:    74 years              BP:           132/61 mmHg Patient Gender: F                     HR:           61 bpm. Exam Location:  Inpatient Procedure: 2D  Echo Indications:    syncope  History:        Patient has no prior history of Echocardiogram examinations.                 Signs/Symptoms:Syncope; Risk Factors:Hypertension and                 Dyslipidemia.  Sonographer:    Harvie Junior Referring Phys: [id number] JARED M GARDNER  Sonographer Comments: Suboptimal subcostal window. Image acquisition challenging due to patient body habitus. IMPRESSIONS  1. Left ventricular ejection fraction, by estimation, is 60 to 65%. The left ventricle has normal function. The left ventricle has no regional wall motion abnormalities. Left ventricular diastolic parameters are consistent with Grade I diastolic dysfunction (impaired relaxation).  2. Right ventricular systolic function is normal. The right ventricular size is normal. There is normal pulmonary artery systolic pressure.  3. The mitral valve is normal in structure. No evidence of mitral valve regurgitation. No evidence of mitral stenosis. Moderate mitral annular calcification.  4. The aortic valve is normal in structure. Aortic valve regurgitation is not visualized. No aortic stenosis is present.  5. The inferior vena cava is normal in size  with greater than 50% respiratory variability, suggesting right atrial pressure of 3 mmHg. FINDINGS  Left Ventricle: Left ventricular ejection fraction, by estimation, is 60 to 65%. The left ventricle has normal function. The left ventricle has no regional wall motion abnormalities. The left ventricular internal cavity size was normal in size. There is  no left ventricular hypertrophy. Left ventricular diastolic parameters are consistent with Grade I diastolic dysfunction (impaired relaxation). Right Ventricle: The right ventricular size is normal. No increase in right ventricular wall thickness. Right ventricular systolic function is normal. There is normal pulmonary artery systolic pressure. The tricuspid regurgitant velocity is 2.46 m/s, and  with an assumed right atrial pressure of 3 mmHg, the estimated right ventricular systolic pressure is 27.7 mmHg. Left Atrium: Left atrial size was normal in size. Right Atrium: Right atrial size was normal in size. Pericardium: There is no evidence of pericardial effusion. Mitral Valve: The mitral valve is normal in structure. Moderate mitral annular calcification. No evidence of mitral valve regurgitation. No evidence of mitral valve stenosis. Tricuspid Valve: The tricuspid valve is normal in structure. Tricuspid valve regurgitation is trivial. No evidence of tricuspid stenosis. Aortic Valve: The aortic valve is normal in structure. Aortic valve regurgitation is not visualized. No aortic stenosis is present. Aortic valve mean gradient measures 4.0 mmHg. Aortic valve peak gradient measures 8.3 mmHg. Aortic valve area, by VTI measures 2.16 cm. Pulmonic Valve: The pulmonic valve was normal in structure. Pulmonic valve regurgitation is trivial. No evidence of pulmonic stenosis. Aorta: The aortic root is normal in size and structure. Venous: The inferior vena cava is normal in size with greater than 50% respiratory variability, suggesting right atrial pressure of 3 mmHg.  IAS/Shunts: No atrial level shunt detected by color flow Doppler.  LEFT VENTRICLE PLAX 2D LVIDd:         4.20 cm     Diastology LVIDs:         2.60 cm     LV e' medial:    7.29 cm/s LV PW:         0.80 cm     LV E/e' medial:  13.4 LV IVS:        0.90 cm     LV e' lateral:   8.38 cm/s LVOT diam:     1.80 cm  LV E/e' lateral: 11.7 LV SV:         66 LV SV Index:   34 LVOT Area:     2.54 cm  LV Volumes (MOD) LV vol d, MOD A2C: 84.9 ml LV vol d, MOD A4C: 62.7 ml LV vol s, MOD A2C: 36.6 ml LV vol s, MOD A4C: 25.1 ml LV SV MOD A2C:     48.3 ml LV SV MOD A4C:     62.7 ml LV SV MOD BP:      44.4 ml RIGHT VENTRICLE RV Basal diam:  3.40 cm RV Mid diam:    2.90 cm RV S prime:     14.10 cm/s TAPSE (M-mode): 2.0 cm LEFT ATRIUM             Index        RIGHT ATRIUM           Index LA diam:        3.10 cm 1.57 cm/m   RA Area:     11.90 cm LA Vol (A2C):   51.6 ml 26.09 ml/m  RA Volume:   23.30 ml  11.78 ml/m LA Vol (A4C):   46.0 ml 23.26 ml/m LA Biplane Vol: 51.3 ml 25.94 ml/m  AORTIC VALVE                    PULMONIC VALVE AV Area (Vmax):    2.14 cm     PV Vmax:          0.96 m/s AV Area (Vmean):   2.17 cm     PV Peak grad:     3.7 mmHg AV Area (VTI):     2.16 cm     PR End Diast Vel: 2.65 msec AV Vmax:           144.00 cm/s AV Vmean:          94.100 cm/s AV VTI:            0.307 m AV Peak Grad:      8.3 mmHg AV Mean Grad:      4.0 mmHg LVOT Vmax:         121.00 cm/s LVOT Vmean:        80.400 cm/s LVOT VTI:          0.261 m LVOT/AV VTI ratio: 0.85  AORTA Ao Root diam: 3.20 cm Ao Asc diam:  3.10 cm MITRAL VALVE                TRICUSPID VALVE MV Area (PHT): 3.72 cm     TR Peak grad:   24.2 mmHg MV Decel Time: 204 msec     TR Vmax:        246.00 cm/s MR Peak grad: 25.9 mmHg MR Vmax:      254.67 cm/s   SHUNTS MV E velocity: 98.00 cm/s   Systemic VTI:  0.26 m MV A velocity: 117.00 cm/s  Systemic Diam: 1.80 cm MV E/A ratio:  0.84 Candee Furbish MD Electronically signed by Candee Furbish MD Signature Date/Time: 03/31/2022/3:12:27  PM    Final     Assessment / Plan:  71 year old female with nausea, lower abdominal pain with hematochezia and associated syncopal episode admitted to the hospital 03/30/2022. CTAP showed colitis from the splenic flexure through the sigmoid colon, colonic diverticulosis without diverticulitis. Suspect ischemic colitis, possibly associated with Semaglutide (Ozempic).  She passed a solid and loose brown stool with a moderate amount or red blood around  5 am.  Hemodynamically stable.  Afebrile.  WBC 11.1 -> 13.9 -> 10.1.  Hg 14.6 -> 12.6. GI pathogen panel pending. On Cipro and Flagyl IV.  Colonoscopy 12/25/2021 showed diverticulosis in the sigmoid colon without evidence of colitis/IBD.  Hematochezia and syncopal episode WBC 9.5 HGB 12.7 MCV 88.8 Platelets 219 BUN 7 Cr 0.68  CT abdomen pelvis colitis from splenic flexure to sigmoid, diverticulosis likely ischemic colitis possibly associated with Ozempic Patient's not had a bowel movement since Tuesday morning, however she is having no abdominal pain, walking, and is hungry. GI pathogen negative Colonoscopy 12/25/2021 showed diverticulosis in the sigmoid colon without evidence of colitis/IBD. -Recommend discontinuing Semaglutide -Cipro and Flagyl IV -transition to p.o. antibiotics for at least 5 more days. -Patient's hungry and doing well, can advance diet to soft, go back to full liquid diet if patient has any nausea vomiting. -Continue Zofran p.o. every 6 hours as needed -Pain management per hospitalist team -No plans for endoscopic evaluation at this time -Await further recommendations per Dr. Rush Landmark Would suggest outpatient follow-up   Celiac disease  -Maintain gluten-free diet during her hospitalization   History of tubular adenomatous polyps per colonoscopy 12/2021 -Next colon polyp surveillance colonoscopy due 12/2028    Personal history of breast cancer   Hypokalemia potassium/magnesium per primary BUN 7 Cr 0.68  GFR >60   Potassium 3.3  Magnesium 1.6        LOS: 2 days   Vladimir Crofts  04/02/2022, 9:56 AM

## 2022-04-02 NOTE — TOC Transition Note (Signed)
Transition of Care [facility]) - CM/SW Discharge Note   Patient Details  Name: Christine Leon MRN: [id number] Date of Birth: 07/12/1951  [facility] CM/SW Contact:  Leeroy Cha, RN Phone Number: 04/02/2022, 1:57 PM   Clinical Narrative:    Patient discharged to return home with self care. Toc needs present.     Barriers to Discharge: Barriers Resolved   Patient Goals and CMS Choice Patient states their goals for this hospitalization and ongoing recovery are:: to go home   Choice offered to / list presented to : Patient  Discharge Placement                       Discharge Plan and Services   Discharge Planning Services: CM Consult                                 Social Determinants of Health (SDOH) Interventions     Readmission Risk Interventions   No data to display
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
## 2022-04-03 ENCOUNTER — Telehealth: Payer: Self-pay

## 2022-04-03 NOTE — Telephone Encounter (Signed)
Transition Care Management Unsuccessful Follow-up Telephone Call  Date of discharge and from where:  04/02/22 [place]   Attempts:  1st Attempt  Reason for unsuccessful TCM follow-up call:  Left voice message

## 2022-04-06 ENCOUNTER — Encounter: Payer: Self-pay | Admitting: *Deleted

## 2022-04-09 ENCOUNTER — Telehealth: Payer: Self-pay

## 2022-04-09 NOTE — Patient Outreach (Signed)
  Care Coordination TOC Note Transition Care Management Follow-up Telephone Call Date of discharge and from where: 04/02/22-[facility] How have you been since you were released from the hospital? Patient reports he has felt "really good." She is currently out and about running errands. She has completed abx therapy. States she has had no GI issues and having normal BMs.  Any questions or concerns? No  Items Reviewed: Did the pt receive and understand the discharge instructions provided? Yes  Medications obtained and verified?  Unable to complete med review-pt not at home Other? No  Any new allergies since your discharge? No  Dietary orders reviewed? Yes-soft diet Do you have support at home? Yes   Home Care and Equipment/Supplies: Were home health services ordered? not applicable If so, what is the name of the agency? N/A  Has the agency set up a time to come to the patient's home? not applicable Were any new equipment or medical supplies ordered?  No What is the name of the medical supply agency? N/a Were you able to get the supplies/equipment? not applicable Do you have any questions related to the use of the equipment or supplies? No  Functional Questionnaire: (I = Independent and D = Dependent) ADLs: I  Bathing/Dressing- I  Meal Prep- I  Eating- I  Maintaining continence- I  Transferring/Ambulation- I  Managing Meds- I  Follow up appointments reviewed:  PCP Hospital f/u appt confirmed?  Patient has not called yet-discussed PCP follow up time frame per d/c summary-will call to make an appt-declined need for assistance with scheduling appt . [facility] f/u appt confirmed?  Patient will call cardiology office to make an appt  . Are transportation arrangements needed? No  If their condition worsens, is the pt aware to call PCP or go to the Emergency Dept.? Yes Was the patient provided with contact information for the PCP's office or ED? Yes Was to pt  encouraged to call back with questions or concerns? Yes  SDOH assessments and interventions completed:   Yes  Care Coordination Interventions Activated:  No   Care Coordination Interventions:  No Care Coordination interventions needed at this time.   Encounter Outcome:  Pt. Visit Completed    Enzo Montgomery, RN,BSN,CCM [place] Management Telephonic Care Management Coordinator Direct Phone: [phone] Toll Free: [phone] Fax: [phone]

## 2022-04-27 ENCOUNTER — Other Ambulatory Visit: Payer: Self-pay | Admitting: Family Medicine

## 2022-04-27 DIAGNOSIS — Z853 Personal history of malignant neoplasm of breast: Secondary | ICD-10-CM

## 2022-05-12 NOTE — Progress Notes (Signed)
Patient Care Team: Marin Olp, MD as PCP - General (Family Medicine) Martinique, Peter M, MD as PCP - Cardiology (Cardiology) Rolm Bookbinder, MD as Consulting Physician (General Surgery) Magrinat, Virgie Dad, MD (Inactive) as Consulting Physician (Oncology) Eppie Gibson, MD as Attending Physician (Radiation Oncology) Armbruster, Carlota Raspberry, MD as Consulting Physician (Gastroenterology) Madelin Rear, [facility] (Inactive) as Pharmacist (Pharmacist)  DIAGNOSIS:  Encounter Diagnosis  Name Primary?   Malignant neoplasm of upper-outer quadrant of right breast in female, estrogen receptor positive ([place]) Yes    SUMMARY OF ONCOLOGIC HISTORY: Oncology History  Malignant neoplasm of upper-outer quadrant of right breast in female, estrogen receptor positive ([place])  07/27/2017 - 08/06/2017 Anti-estrogen oral therapy   Tamoxifen daily, discontinued due to rash   04/22/2018 Initial Diagnosis   Christine Leon, [place] woman status post right breast upper outer quadrant biopsy for a clinical T1a N0, stage IA invasive ductal carcinoma, grade 1, estrogen and progesterone receptor positive, HER-2 not amplified, with an MIB-1 of 3%.          04/27/2018 Surgery   status post right lumpectomy and sentinel lymph node sampling for a pT1c pN0, stage IA invasive ductal carcinoma, grade 1, with negative margins             (a) 1 sentinel lymph node was removed   04/27/2018 Oncotype testing   Oncotype DX score of 17 predicts a risk of recurrence outside the breast over 9 years of 5% if the patient's only systemic therapy is an antiestrogen for 5 years.  It also predicts no benefit from chemotherapy   05/11/2018 Cancer Staging   Staging form: Breast, AJCC 8th Edition - Clinical: Stage IA (cT1c, cN0, cM0, G1, ER+, PR+, HER2-) - Signed by Eppie Gibson, MD on 05/11/2018   05/13/2018 Cancer Staging   Staging form: Breast, AJCC 8th Edition - Pathologic: Stage IA (pT1c, pN0, cM0, G1, ER+, PR+, HER2-) - Signed by  Eppie Gibson, MD on 05/13/2018   05/26/2018 - 06/29/2018 Radiation Therapy     1. Right breast, 2.67 Gy x 15 fractions for a total dose of 40.05 Gy  2. Boost, 2 Gy x 5 fractions for a total dose of 10 Gy   08/31/2018 -  Anti-estrogen oral therapy   Anastrozole daily     CHIEF COMPLIANT: Follow up breast cancer surveillance on anastrozole  INTERVAL HISTORY: Christine Leon is a 71 y.o here for breast cancer surveillance currently on anastrozole. She presents to the clinic for a follow-up. Establish oncology care with Dr. Lindi Adie. She states that she is tolerating the anastrozole. She does have some manageable hot flashes and some mild joint stiffness. She denies any pain or discomfort in breast. She does walk for exercising. She also has been doing intermittent fasting for about 6 months.      ALLERGIES:  is allergic to gluten meal, wheat bran, and cephalexin.  MEDICATIONS:  Current Outpatient Medications  Medication Sig Dispense Refill   ALPHA LIPOIC ACID PO Take 1 tablet by mouth daily.     Cholecalciferol (VITAMIN D-3 PO) Take 1,500 Units by mouth every evening.     fish oil-omega-3 fatty acids 1000 MG capsule Take 1 g by mouth every evening.     fluticasone (FLONASE) 50 MCG/ACT nasal spray Place 2 sprays into both nostrils daily. (Patient taking differently: Place 2 sprays into both nostrils daily as needed for allergies.) 16 g 6   gabapentin (NEURONTIN) 100 MG capsule TAKE ONE CAPSULE BY MOUTH TWICE DAILY. THEN TAKE 320m  capsule BEFORE bed (Patient taking differently: Take 100 mg by mouth 2 (two) times daily as needed (nerve pain).) 180 capsule 6   glucosamine-chondroitin 500-400 MG tablet Take 1 tablet by mouth daily.     levothyroxine (SYNTHROID) 112 MCG tablet TAKE 1 TABLET BY MOUTH EVERY DAY 90 tablet 3   lisinopril-hydrochlorothiazide (ZESTORETIC) 20-25 MG tablet TAKE 1 TABLET BY MOUTH EVERY DAY 90 tablet 3   Magnesium Oxide 400 MG CAPS Take 1 capsule (400 mg total) by  mouth at bedtime.  0   metroNIDAZOLE (FLAGYL) 500 MG/100ML Inject 100 mLs (500 mg total) into the vein every 12 (twelve) hours. 6 mL 0   Multiple Vitamin (MULTIVITAMIN ADULT PO) Take 1 capsule by mouth daily.     ondansetron (ZOFRAN) 4 MG tablet Take 1 tablet (4 mg total) by mouth every 6 (six) hours as needed for nausea. 20 tablet 0   rosuvastatin (CRESTOR) 10 MG tablet Take 1 tablet (10 mg total) by mouth 2 (two) times a week. 90 tablet 3   anastrozole (ARIMIDEX) 1 MG tablet Take 1 tablet (1 mg total) by mouth daily. 90 tablet 4   No current facility-administered medications for this visit.    PHYSICAL EXAMINATION: ECOG PERFORMANCE STATUS: 1 - Symptomatic but completely ambulatory  Vitals:   05/19/22 0906  BP: (!) 171/75  Pulse: 74  Resp: 18  Temp: (!) 97.5 F (36.4 C)  SpO2: 97%   Filed Weights   05/19/22 0906  Weight: 189 lb 9.6 oz (86 kg)    BREAST: No palpable masses or nodules in either right or left breasts. No palpable axillary supraclavicular or infraclavicular adenopathy no breast tenderness or nipple discharge. (exam performed in the presence of a chaperone)  LABORATORY DATA:  I have reviewed the data as listed    Latest Ref Rng & Units 05/19/2022    8:46 AM 04/02/2022    5:59 AM 04/01/2022    6:05 AM  CMP  Glucose 70 - 99 mg/dL 117  98  95   BUN 8 - 23 mg/dL _0 Creatinine 0.44 - 1.00 mg/dL 0.81  0.68  0.65   Sodium 135 - 145 mmol/L 141  142  144   Potassium 3.5 - 5.1 mmol/L 3.7  3.3  3.2   Chloride 98 - 111 mmol/L 106  112  108   CO2 22 - 32 mmol/L _1 Calcium 8.9 - 10.3 mg/dL 9.8  8.6  8.9   Total Protein 6.5 - 8.1 g/dL 7.0     Total Bilirubin 0.3 - 1.2 mg/dL 0.7     Alkaline Phos 38 - 126 U/L 70     AST 15 - 41 U/L 28     ALT 0 - 44 U/L 34       Lab Results  Component Value Date   WBC 5.1 05/19/2022   HGB 14.7 05/19/2022   HCT 42.5 05/19/2022   MCV 87.6 05/19/2022   PLT 241 05/19/2022   NEUTROABS 2.6 05/19/2022    ASSESSMENT &  PLAN:  Malignant neoplasm of upper-outer quadrant of right breast in female, estrogen receptor positive ([place]) Dr. Jana Hakim patient to establish oncology care with me 03/30/2018: Right breast UOQ T1 a N0 stage Ia grade 1 IDC ER/PR positive HER2 negative Ki-67 3% 04/27/2018: Right lumpectomy T1 cN0 stage Ia grade 1 IDC with negative margins 1 sentinel node negative, Oncotype Dx: 17: 5% 06/28/2018: Completed adjuvant radiation 07/27/2017: Started tamoxifen  discontinued due to rash switched to anastrozole starting 08/31/2018  Anastrozole toxicities: Tolerating it well without any problems or concerns.  She blacked out recently in October and it turns out that she was bleeding and had colitis.  She is doing significantly better.  Breast cancer surveillance: Breast exam 05/19/2022: Benign Mammogram scheduled for 06/09/2022. Return to clinic in 1 year for follow-up.    No orders of the defined types were placed in this encounter.  The patient has a good understanding of the overall plan. she agrees with it. she will call with any problems that may develop before the next visit here. Total time spent: 30 mins including face to face time and time spent for planning, charting and co-ordination of care   Harriette Ohara, MD 05/19/22    I Gardiner Coins am scribing for Dr. Lindi Adie  I have reviewed the above documentation for accuracy and completeness, and I agree with the above.

## 2022-05-18 ENCOUNTER — Other Ambulatory Visit: Payer: Self-pay

## 2022-05-18 DIAGNOSIS — C50411 Malignant neoplasm of upper-outer quadrant of right female breast: Secondary | ICD-10-CM

## 2022-05-19 ENCOUNTER — Inpatient Hospital Stay: Payer: Medicare HMO | Attending: Oncology

## 2022-05-19 ENCOUNTER — Inpatient Hospital Stay: Payer: Medicare HMO | Admitting: Hematology and Oncology

## 2022-05-19 ENCOUNTER — Other Ambulatory Visit: Payer: Self-pay

## 2022-05-19 VITALS — BP 171/75 | HR 74 | Temp 97.5°F | Resp 18 | Ht 65.0 in | Wt 189.6 lb

## 2022-05-19 DIAGNOSIS — Z923 Personal history of irradiation: Secondary | ICD-10-CM | POA: Diagnosis not present

## 2022-05-19 DIAGNOSIS — Z79811 Long term (current) use of aromatase inhibitors: Secondary | ICD-10-CM | POA: Insufficient documentation

## 2022-05-19 DIAGNOSIS — C50411 Malignant neoplasm of upper-outer quadrant of right female breast: Secondary | ICD-10-CM | POA: Insufficient documentation

## 2022-05-19 DIAGNOSIS — Z170 Estrogen receptor positive status [ER+]: Secondary | ICD-10-CM | POA: Insufficient documentation

## 2022-05-19 LAB — CBC WITH DIFFERENTIAL (CANCER CENTER ONLY)
Abs Immature Granulocytes: 0.02 10*3/uL (ref 0.00–0.07)
Basophils Absolute: 0 10*3/uL (ref 0.0–0.1)
Basophils Relative: 1 %
Eosinophils Absolute: 0.1 10*3/uL (ref 0.0–0.5)
Eosinophils Relative: 3 %
HCT: 42.5 % (ref 36.0–46.0)
Hemoglobin: 14.7 g/dL (ref 12.0–15.0)
Immature Granulocytes: 0 %
Lymphocytes Relative: 39 %
Lymphs Abs: 2 10*3/uL (ref 0.7–4.0)
MCH: 30.3 pg (ref 26.0–34.0)
MCHC: 34.6 g/dL (ref 30.0–36.0)
MCV: 87.6 fL (ref 80.0–100.0)
Monocytes Absolute: 0.3 10*3/uL (ref 0.1–1.0)
Monocytes Relative: 7 %
Neutro Abs: 2.6 10*3/uL (ref 1.7–7.7)
Neutrophils Relative %: 50 %
Platelet Count: 241 10*3/uL (ref 150–400)
RBC: 4.85 MIL/uL (ref 3.87–5.11)
RDW: 13.4 % (ref 11.5–15.5)
WBC Count: 5.1 10*3/uL (ref 4.0–10.5)
nRBC: 0 % (ref 0.0–0.2)

## 2022-05-19 LAB — CMP (CANCER CENTER ONLY)
ALT: 34 U/L (ref 0–44)
AST: 28 U/L (ref 15–41)
Albumin: 4.4 g/dL (ref 3.5–5.0)
Alkaline Phosphatase: 70 U/L (ref 38–126)
Anion gap: 8 (ref 5–15)
BUN: 14 mg/dL (ref 8–23)
CO2: 27 mmol/L (ref 22–32)
Calcium: 9.8 mg/dL (ref 8.9–10.3)
Chloride: 106 mmol/L (ref 98–111)
Creatinine: 0.81 mg/dL (ref 0.44–1.00)
GFR, Estimated: >60 mL/min (ref >60)
Glucose, Bld: 117 mg/dL — ABNORMAL HIGH (ref 70–99)
Potassium: 3.7 mmol/L (ref 3.5–5.1)
Sodium: 141 mmol/L (ref 135–145)
Total Bilirubin: 0.7 mg/dL (ref 0.3–1.2)
Total Protein: 7 g/dL (ref 6.5–8.1)

## 2022-05-19 MED ORDER — ANASTROZOLE 1 MG PO TABS: 1.0000 mg | ORAL_TABLET | Freq: Every day | ORAL | 4 refills | Status: DC

## 2022-05-19 NOTE — Assessment & Plan Note (Addendum)
Dr. Jana Hakim patient to establish oncology care with me 03/30/2018: Right breast UOQ T1 a N0 stage Ia grade 1 IDC ER/PR positive HER2 negative Ki-67 3% 04/27/2018: Right lumpectomy T1 cN0 stage Ia grade 1 IDC with negative margins 1 sentinel node negative, Oncotype Dx: 17: 5% 06/28/2018: Completed adjuvant radiation 07/27/2017: Started tamoxifen discontinued due to rash switched to anastrozole starting 08/31/2018  Anastrozole toxicities: Tolerating it well without any problems or concerns.  She blacked out recently in October and it turns out that she was bleeding and had colitis.  She is doing significantly better.  Breast cancer surveillance: Breast exam 05/19/2022: Benign Mammogram scheduled for 06/09/2022. Return to clinic in 1 year for follow-up.

## 2022-05-20 ENCOUNTER — Encounter: Payer: Self-pay | Admitting: Family Medicine

## 2022-05-20 ENCOUNTER — Ambulatory Visit (INDEPENDENT_AMBULATORY_CARE_PROVIDER_SITE_OTHER): Payer: Medicare HMO | Admitting: Family Medicine

## 2022-05-20 VITALS — BP 124/70 | HR 70 | Temp 97.5°F | Ht 65.0 in | Wt 189.2 lb

## 2022-05-20 DIAGNOSIS — Z0000 Encounter for general adult medical examination without abnormal findings: Secondary | ICD-10-CM

## 2022-05-20 DIAGNOSIS — Z87891 Personal history of nicotine dependence: Secondary | ICD-10-CM

## 2022-05-20 LAB — URINALYSIS, ROUTINE W REFLEX MICROSCOPIC
Bilirubin Urine: NEGATIVE
Hgb urine dipstick: NEGATIVE
Ketones, ur: NEGATIVE
Nitrite: NEGATIVE
RBC / HPF: NONE SEEN (ref 0–?)
Specific Gravity, Urine: 1.01 (ref 1.000–1.030)
Total Protein, Urine: NEGATIVE
Urine Glucose: NEGATIVE
Urobilinogen, UA: 0.2 (ref 0.0–1.0)
pH: 6 (ref 5.0–8.0)
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
## 2022-05-20 NOTE — Patient Instructions (Addendum)
Health Maintenance Due  Topic Date Due   Medicare Annual Wellness (AWV)  07/11/2022  You are eligible to schedule your annual wellness visit with our nurse specialist Otila Kluver.  Please consider scheduling this before you leave today  Please stop by lab before you go- just for urine If you have mychart- we will send your results within 3 business days of [place] receiving them.  If you do not have mychart- we will call you about results within 5 business days of [place] receiving them.  *please also note that you will see labs on mychart as soon as they post. I will later go in and write notes on them- will say "notes from Dr. Yong Channel"  Recommended follow up: Return in about 6 months (around 11/18/2022) for followup or sooner if needed.Schedule b4 you leave.

## 2022-06-09 ENCOUNTER — Ambulatory Visit
Admission: RE | Admit: 2022-06-09 | Discharge: 2022-06-09 | Disposition: A | Discharge status: Home / Self Care | Payer: Medicare HMO | Source: Ambulatory Visit | Attending: Family Medicine | Admitting: Family Medicine

## 2022-06-09 DIAGNOSIS — Z853 Personal history of malignant neoplasm of breast: Secondary | ICD-10-CM | POA: Diagnosis not present

## 2022-06-09 DIAGNOSIS — R92323 Mammographic fibroglandular density, bilateral breasts: Secondary | ICD-10-CM | POA: Diagnosis not present

## 2022-06-26 ENCOUNTER — Other Ambulatory Visit: Payer: Self-pay

## 2022-06-26 ENCOUNTER — Telehealth: Payer: Self-pay | Admitting: Family Medicine

## 2022-06-26 MED ORDER — GABAPENTIN 100 MG PO CAPS: 100.0000 mg | ORAL_CAPSULE | Freq: Two times a day (BID) | ORAL | 3 refills | Status: DC | PRN

## 2022-06-26 MED ORDER — GABAPENTIN 300 MG PO CAPS: ORAL_CAPSULE | ORAL | 4 refills | Status: DC

## 2022-06-26 NOTE — Telephone Encounter (Signed)
..   Encourage patient to contact the pharmacy for refills or they can request refills through [place]:  Please schedule appointment if longer than 1 year  NEXT APPOINTMENT DATE: 11/2022   MEDICATION:gabapentin (NEURONTIN) 100 MG capsule   Is the patient out of medication?  Yes   PH[facility]ACY[facility]   Let patient know to contact pharmacy at the end of the day to make sure medication is ready.  Please notify patient to allow 48-72 hours to process  Yes

## 2022-06-26 NOTE — Telephone Encounter (Signed)
Refill sent to pharmacy.   

## 2022-07-08 ENCOUNTER — Encounter: Payer: Self-pay | Admitting: Gastroenterology

## 2022-07-16 ENCOUNTER — Ambulatory Visit: Payer: Medicare HMO

## 2022-10-01 DIAGNOSIS — J209 Acute bronchitis, unspecified: Secondary | ICD-10-CM | POA: Diagnosis not present

## 2022-10-01 DIAGNOSIS — R051 Acute cough: Secondary | ICD-10-CM | POA: Diagnosis not present

## 2022-10-01 DIAGNOSIS — Z03818 Encounter for observation for suspected exposure to other biological agents ruled out: Secondary | ICD-10-CM | POA: Diagnosis not present

## 2022-10-15 ENCOUNTER — Other Ambulatory Visit: Payer: Self-pay | Admitting: Family Medicine

## 2022-10-21 ENCOUNTER — Telehealth: Payer: Self-pay | Admitting: Family Medicine

## 2022-10-21 NOTE — Telephone Encounter (Signed)
Contacted Taramarie Schnake Copher to schedule their annual wellness visit. Appointment made for 10/29/2022.  Gabriel Cirri [facility] AWV TEAM Direct Dial [phone]

## 2022-10-29 ENCOUNTER — Ambulatory Visit (INDEPENDENT_AMBULATORY_CARE_PROVIDER_SITE_OTHER): Payer: Medicare HMO

## 2022-10-29 VITALS — Wt 189.0 lb

## 2022-10-29 DIAGNOSIS — Z0000 Encounter for general adult medical examination without abnormal findings: Secondary | ICD-10-CM | POA: Diagnosis not present

## 2022-10-29 NOTE — Progress Notes (Signed)
I connected with  Christine Leon on 10/29/22 by a audio enabled telemedicine application and verified that I am speaking with the correct person using two identifiers.  Patient Location: Home  Provider Location: Office/Clinic  I discussed the limitations of evaluation and management by telemedicine. The patient expressed understanding and agreed to proceed.   Subjective:   Christine Leon is a 72 y.o. female who presents for Medicare Annual (Subsequent) preventive examination.  Review of Systems     Cardiac Risk Factors include: advanced age (>85men, >13 women);dyslipidemia;obesity (BMI >30kg/m2);hypertension     Objective:    Today's Vitals   10/29/22 1504  Weight: 189 lb (85.7 kg)   Body mass index is 31.45 kg/m.     07/11/2021    3:23 PM 05/05/2021   11:04 AM 04/13/2019    8:06 AM 08/03/2018    3:30 PM 05/13/2018   11:03 AM 05/11/2018    1:49 PM 04/20/2018   12:01 PM  Advanced Directives  Does Patient Have a Medical Advance Directive? Yes No Yes Yes Yes Yes Yes  Type of Forensic scientist of [place];Living will Healthcare Power of [place];Living will Healthcare Power of [place];Living will Healthcare Power of [place];Living will Healthcare Power of [place];Living will  Copy of Healthcare Power of Attorney in Chart? Yes - validated most recent copy scanned in chart (See row information)   No - copy requested No - copy requested No - copy requested   Would patient like information on creating a medical advance directive?     No - Patient declined No - Patient declined     Current Medications (verified) Outpatient Encounter Medications as of 10/29/2022  Medication Sig   ALPHA LIPOIC ACID PO Take 1 tablet by mouth daily.   anastrozole (ARIMIDEX) 1 MG tablet Take 1 tablet (1 mg total) by mouth daily.   Cholecalciferol (VITAMIN D-3 PO) Take 1,500 Units by mouth every evening.   fish oil-omega-3 fatty acids 1000 MG  capsule Take 1 g by mouth every evening.   fluticasone (FLONASE) 50 MCG/ACT nasal spray Place 2 sprays into both nostrils daily. (Patient taking differently: Place 2 sprays into both nostrils daily as needed for allergies.)   gabapentin (NEURONTIN) 100 MG capsule Take 1 capsule (100 mg total) by mouth 2 (two) times daily as needed (nerve pain).   gabapentin (NEURONTIN) 300 MG capsule TAKE 1 CAPSULE BY MOUTH EVERY DAY AT BEDTIME   glucosamine-chondroitin 500-400 MG tablet Take 1 tablet by mouth daily.   levothyroxine (SYNTHROID) 112 MCG tablet TAKE 1 TABLET BY MOUTH EVERY DAY   lisinopril-hydrochlorothiazide (ZESTORETIC) 20-25 MG tablet TAKE 1 TABLET BY MOUTH EVERY DAY   Magnesium Oxide 400 MG CAPS Take 1 capsule (400 mg total) by mouth at bedtime.   metroNIDAZOLE (FLAGYL) 500 MG/100ML Inject 100 mLs (500 mg total) into the vein every 12 (twelve) hours.   Multiple Vitamin (MULTIVITAMIN ADULT PO) Take 1 capsule by mouth daily.   ondansetron (ZOFRAN) 4 MG tablet Take 1 tablet (4 mg total) by mouth every 6 (six) hours as needed for nausea.   rosuvastatin (CRESTOR) 10 MG tablet Take 1 tablet (10 mg total) by mouth 2 (two) times a week.   No facility-administered encounter medications on file as of 10/29/2022.    Allergies (verified) Gluten meal, Wheat, and Cephalexin   History: Past Medical History:  Diagnosis Date   Allergy    Celiac sprue    History of radiation therapy 05/26/18- 06/29/18  Right Breast 15 fractions for a total dose of 40.05 Gy. Right Breast boost 5 fractions for a total dose of 10 Gy   Hypertension    Hypothyroidism    Personal history of radiation therapy 2019   PONV (postoperative nausea and vomiting)    Past Surgical History:  Procedure Laterality Date   ABDOMINAL HYSTERECTOMY     in 59s. fibroids. including cervix removal. per patient was told no further pap smears after.    arthroscopic knee surgery     2015 under Dr. Thomasena Edis   BREAST BIOPSY Right 04/01/2018    BREAST LUMPECTOMY Right 2019   BREAST LUMPECTOMY WITH RADIOACTIVE SEED AND SENTINEL LYMPH NODE BIOPSY Right 04/27/2018   Procedure: RIGHT BREAST LUMPECTOMY WITH RADIOACTIVE SEED AND RIGHT AXILLARYSENTINEL LYMPH NODE BIOPSY ERAS PATHWAY;  Surgeon: Emelia Loron, MD;  Location: [facility];  Service: General;  Laterality: Right;  PECTORAL BLOCK   CHOLECYSTECTOMY     COCCYX REMOVAL     after cheerleading accident   OOPHORECTOMY     in 38s   TONSILLECTOMY     Family History  Problem Relation Age of Onset   Hypertension Mother    Coronary artery disease Father        16   COPD Father        passed age 58   Pulmonary fibrosis Father    Idiopathic pulmonary fibrosis Father    Breast cancer Maternal Aunt    Pulmonary fibrosis Paternal Aunt    Stomach cancer Cousin        Paternal   Colon cancer Neg Hx    Esophageal cancer Neg Hx    Pancreatic cancer Neg Hx    Liver disease Neg Hx    Social History   Socioeconomic History   Marital status: Married    Spouse name: Not on file   Number of children: 2   Years of education: Not on file   Highest education level: Not on file  Occupational History   Not on file  Tobacco Use   Smoking status: Former    Packs/day: 1.00    Years: 39.00    Additional pack years: 0.00    Total pack years: 39.00    Types: Cigarettes    Quit date: 07/13/2006    Years since quitting: 16.3   Smokeless tobacco: Never  Vaping Use   Vaping Use: Never used  Substance and Sexual Activity   Alcohol use: Yes    Comment: socially   Drug use: No   Sexual activity: Not on file  Other Topics Concern   Not on file  Social History Narrative   Married (husband patient of Dr. Durene Cal), 2 children, 1 grandchild   Her mom lives with her. Dad passed 2017- had lived with them.       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      Hobbies: Traveling   Social Determinants of Health   Financial Resource Strain: Low Risk   (10/29/2022)   Overall Financial Resource Strain (CARDIA)    Difficulty of Paying Living Expenses: Not hard at all  Food Insecurity: No Food Insecurity (10/29/2022)   Hunger Vital Sign    Worried About Running Out of Food in the Last Year: Never true    Ran Out of Food in the Last Year: Never true  Transportation Needs: No Transportation Needs (10/29/2022)   PRAPARE - Transportation    Lack of Transportation (Medical): No    Lack of  Transportation (Non-Medical): No  Physical Activity: Inactive (10/29/2022)   Exercise Vital Sign    Days of Exercise per Week: 0 days    Minutes of Exercise per Session: 0 min  Stress: No Stress Concern Present (10/29/2022)   [employer] of Occupational Health - Occupational Stress Questionnaire    Feeling of Stress : Not at all  Social Connections: Moderately Isolated (10/29/2022)   Social Connection and Isolation Panel [NHANES]    Frequency of Communication with Friends and Family: More than three times a week    Frequency of Social Gatherings with Friends and Family: Three times a week    Attends Religious Services: Never    Active Member of Clubs or Organizations: No    Attends Engineer, structural: Never    Marital Status: Married    Tobacco Counseling Counseling given: Not Answered   Clinical Intake:  Pre-visit preparation completed: Yes  Pain : No/denies pain     BMI - recorded: 31.45 Nutritional Status: BMI > 30  Obese Nutritional Risks: None Diabetes: No  How often do you need to have someone help you when you read instructions, pamphlets, or other written materials from your doctor or pharmacy?: 1 - Never  Diabetic?no  Interpreter Needed?: No  Information entered by :: Lanier Ensign, LPN   Activities of Daily Living    10/29/2022    3:10 PM  In your present state of health, do you have any difficulty performing the following activities:  Hearing? 0  Vision? 0  Difficulty concentrating or making decisions? 0   Walking or climbing stairs? 0  Dressing or bathing? 0  Doing errands, shopping? 0  Preparing Food and eating ? N  Using the Toilet? N  In the past six months, have you accidently leaked urine? N  Do you have problems with loss of bowel control? N  Managing your Medications? N  Managing your Finances? N  Housekeeping or managing your Housekeeping? N    Patient Care Team: Shelva Majestic, MD as PCP - General (Family Medicine) [place], Peter M, MD as PCP - Cardiology (Cardiology) Emelia Loron, MD as Consulting Physician (General Surgery) Magrinat, Valentino Hue, MD (Inactive) as Consulting Physician (Oncology) Lonie Peak, MD as Attending Physician (Radiation Oncology) Armbruster, Willaim Rayas, MD as Consulting Physician (Gastroenterology) Dahlia Byes, [facility] (Inactive) as Pharmacist (Pharmacist)  Indicate any recent Medical Services you may have received from other than [facility] providers in the past year (date may be approximate).     Assessment:   This is a routine wellness examination for Christine Leon.  Hearing/Vision screen Hearing Screening - Comments:: Pt denies any hearing issues  Vision Screening - Comments:: Pt follows up with Dr Randon Goldsmith for annual eye exams   Dietary issues and exercise activities discussed: Current Exercise Habits: The patient does not participate in regular exercise at present   Goals Addressed             This Visit's Progress    Patient Stated       More movement and lose weight        Depression Screen    10/29/2022    3:07 PM 07/11/2021    3:22 PM 05/16/2021    9:30 AM 11/04/2020   10:32 AM 05/06/2020   10:37 AM 05/04/2019   11:09 AM 04/19/2018   10:34 AM  PHQ 2/9 Scores  PHQ - 2 Score 0 0 0 0 0 0 1  PHQ- 9 Score    0  2 5  Fall Risk    10/29/2022    3:10 PM 07/11/2021    3:24 PM 05/16/2021    9:30 AM 11/04/2020   10:32 AM 05/06/2020   10:37 AM  Fall Risk   Falls in the past year? 0 0 0 0 0  Number falls in past yr: 0 0 0 0 0   Injury with Fall? 0 0 0 0 0  Risk for fall due to : Impaired vision;Impaired mobility Impaired vision     Follow up Falls prevention discussed Falls prevention discussed Falls evaluation completed      FALL RISK PREVENTION PERTAINING TO THE HOME:  Any stairs in or around the home? Yes  If so, are there any without handrails? No  Home free of loose throw rugs in walkways, pet beds, electrical cords, etc? Yes  Adequate lighting in your home to reduce risk of falls? Yes   ASSISTIVE DEVICES UTILIZED TO PREVENT FALLS:  Life alert? No  Use of a cane, walker or w/c? No  Grab bars in the bathroom? no Shower chair or bench in shower? No  Elevated toilet seat or a handicapped toilet? No   TIMED UP AND GO:  Was the test performed? No .    Cognitive Function:        10/29/2022    3:11 PM 07/11/2021    3:24 PM  6CIT Screen  What Year? 0 points 0 points  What month? 0 points 0 points  What time? 0 points 0 points  Count back from 20 0 points 0 points  Months in reverse 0 points 0 points  Repeat phrase 0 points 0 points  Total Score 0 points 0 points    Immunizations Immunization History  Administered Date(s) Administered   Fluad Quad(high Dose 65+) 05/04/2019   Influenza, High Dose Seasonal PF 05/21/2016, 04/19/2018   PFIZER(Purple Top)SARS-COV-2 Vaccination 08/03/2019, 08/22/2019, 05/03/2020   Pneumococcal Conjugate-13 04/14/2016   Pneumococcal Polysaccharide-23 04/15/2017   Td 11/07/2008   Tdap 11/24/2013   Zoster, Live 06/02/2013    TDAP status: Up to date  Flu Vaccine status: Declined, Education has been provided regarding the importance of this vaccine but patient still declined. Advised may receive this vaccine at local pharmacy or Health Dept. Aware to provide a copy of the vaccination record if obtained from local pharmacy or Health Dept. Verbalized acceptance and understanding.  Pneumococcal vaccine status: Up to date  Covid-19 vaccine status: Completed  vaccines  Qualifies for Shingles Vaccine? Yes   Zostavax completed Yes   Shingrix Completed?: No.    Education has been provided regarding the importance of this vaccine. Patient has been advised to call insurance company to determine out of pocket expense if they have not yet received this vaccine. Advised may also receive vaccine at local pharmacy or Health Dept. Verbalized acceptance and understanding.  Screening Tests Health Maintenance  Topic Date Due   Zoster Vaccines- Shingrix (1 of 2) Never done   COVID-19 Vaccine (4 - 2023-24 season) 03/13/2022   Hepatitis C Screening  07/10/2098 (Originally 05/20/1969)   INFLUENZA VACCINE  02/11/2023   Medicare Annual Wellness (AWV)  10/29/2023   DTaP/Tdap/Td (3 - Td or Tdap) 11/25/2023   MAMMOGRAM  06/09/2024   COLONOSCOPY (Pts 45-55yrs Insurance coverage will need to be confirmed)  12/25/2028   Pneumonia Vaccine 74+ Years old  Completed   DEXA SCAN  Completed   HPV VACCINES  Aged Out    Health Maintenance  Health Maintenance Due  Topic Date Due  Zoster Vaccines- Shingrix (1 of 2) Never done   COVID-19 Vaccine (4 - 2023-24 season) 03/13/2022    Colorectal cancer screening: Type of screening: Colonoscopy. Completed 12/25/21. Repeat every 7 years  Mammogram status: Completed 06/09/22. Repeat every year  Bone Density status: Completed 04/07/19. Results reflect: Bone density results: NORMAL. Repeat every 2 years.   Additional Screening:  Hepatitis C Screening: does qualify;  Vision Screening: Recommended annual ophthalmology exams for early detection of glaucoma and other disorders of the eye. Is the patient up to date with their annual eye exam?  Yes  Who is the provider or what is the name of the office in which the patient attends annual eye exams? Dr Randon Goldsmith  If pt is not established with a provider, would they like to be referred to a provider to establish care? No .   Dental Screening: Recommended annual dental exams for proper  oral hygiene  Community Resource Referral / Chronic Care Management: CRR required this visit?  No   CCM required this visit?  No      Plan:     I have personally reviewed and noted the following in the patient's chart:   Medical and social history Use of alcohol, tobacco or illicit drugs  Current medications and supplements including opioid prescriptions. Patient is not currently taking opioid prescriptions. Functional ability and status Nutritional status Physical activity Advanced directives List of other physicians Hospitalizations, surgeries, and ER visits in previous 12 months Vitals Screenings to include cognitive, depression, and falls Referrals and appointments  In addition, I have reviewed and discussed with patient certain preventive protocols, quality metrics, and best practice recommendations. A written personalized care plan for preventive services as well as general preventive health recommendations were provided to patient.     Marzella Schlein, LPN   [id number]/64/[id number]   Nurse Notes: none

## 2022-10-29 NOTE — Patient Instructions (Signed)
[name] , Thank you for taking time to come for your Medicare Wellness Visit. I appreciate your ongoing commitment to your health goals. Please review the following plan we discussed and let me know if I can assist you in the future.   These are the goals we discussed:  Goals      Patient Stated     Lose weight      Patient Stated     More movement and lose weight         This is a list of the screening recommended for you and due dates:  Health Maintenance  Topic Date Due   Zoster (Shingles) Vaccine (1 of 2) Never done   COVID-19 Vaccine (4 - 2023-24 season) 03/13/2022   Hepatitis C Screening: USPSTF Recommendation to screen - Ages 18-79 yo.  07/10/2098*   Flu Shot  02/11/2023   Medicare Annual Wellness Visit  10/29/2023   DTaP/Tdap/Td vaccine (3 - Td or Tdap) 11/25/2023   Mammogram  06/09/2024   Colon Cancer Screening  12/25/2028   Pneumonia Vaccine  Completed   DEXA scan (bone density measurement)  Completed   HPV Vaccine  Aged Out  *Topic was postponed. The date shown is not the original due date.    Advanced directives: Please bring a copy of your health care power of attorney and living will to the office at your convenience.  Conditions/risks identified: more movement and lose weight   Next appointment: Follow up in one year for your annual wellness visit    Preventive Care 65 Years and Older, Female Preventive care refers to lifestyle choices and visits with your health care provider that can promote health and wellness. What does preventive care include? A yearly physical exam. This is also called an annual well check. Dental exams once or twice a year. Routine eye exams. Ask your health care provider how often you should have your eyes checked. Personal lifestyle choices, including: Daily care of your teeth and gums. Regular physical activity. Eating a healthy diet. Avoiding tobacco and drug use. Limiting alcohol use. Practicing safe sex. Taking  low-dose aspirin every day. Taking vitamin and mineral supplements as recommended by your health care provider. What happens during an annual well check? The services and screenings done by your health care provider during your annual well check will depend on your age, overall health, lifestyle risk factors, and family history of disease. Counseling  Your health care provider may ask you questions about your: Alcohol use. Tobacco use. Drug use. Emotional well-being. Home and relationship well-being. Sexual activity. Eating habits. History of falls. Memory and ability to understand (cognition). Work and work Astronomer. Reproductive health. Screening  You may have the following tests or measurements: Height, weight, and BMI. Blood pressure. Lipid and cholesterol levels. These may be checked every 5 years, or more frequently if you are over 3 years old. Skin check. Lung cancer screening. You may have this screening every year starting at age 28 if you have a 30-pack-year history of smoking and currently smoke or have quit within the past 15 years. Fecal occult blood test (FOBT) of the stool. You may have this test every year starting at age 21. Flexible sigmoidoscopy or colonoscopy. You may have a sigmoidoscopy every 5 years or a colonoscopy every 10 years starting at age 54. Hepatitis C blood test. Hepatitis B blood test. Sexually transmitted disease (STD) testing. Diabetes screening. This is done by checking your blood sugar (glucose) after you have not eaten  for a while (fasting). You may have this done every 1-3 years. Bone density scan. This is done to screen for osteoporosis. You may have this done starting at age 10. Mammogram. This may be done every 1-2 years. Talk to your health care provider about how often you should have regular mammograms. Talk with your health care provider about your test results, treatment options, and if necessary, the need for more tests. Vaccines   Your health care provider may recommend certain vaccines, such as: Influenza vaccine. This is recommended every year. Tetanus, diphtheria, and acellular pertussis (Tdap, Td) vaccine. You may need a Td booster every 10 years. Zoster vaccine. You may need this after age 86. Pneumococcal 13-valent conjugate (PCV13) vaccine. One dose is recommended after age 43. Pneumococcal polysaccharide (PPSV23) vaccine. One dose is recommended after age 65. Talk to your health care provider about which screenings and vaccines you need and how often you need them. This information is not intended to replace advice given to you by your health care provider. Make sure you discuss any questions you have with your health care provider. Document Released: 07/26/2015 Document Revised: 03/18/2016 Document Reviewed: 04/30/2015 Elsevier Interactive Patient Education  2017 [place] Prevention in the Home Falls can cause injuries. They can happen to people of all ages. There are many things you can do to make your home safe and to help prevent falls. What can I do on the outside of my home? Regularly fix the edges of walkways and driveways and fix any cracks. Remove anything that might make you trip as you walk through a door, such as a raised step or threshold. Trim any bushes or trees on the path to your home. Use bright outdoor lighting. Clear any walking paths of anything that might make someone trip, such as rocks or tools. Regularly check to see if handrails are loose or broken. Make sure that both sides of any steps have handrails. Any raised decks and porches should have guardrails on the edges. Have any leaves, snow, or ice cleared regularly. Use sand or salt on walking paths during winter. Clean up any spills in your garage right away. This includes oil or grease spills. What can I do in the bathroom? Use night lights. Install grab bars by the toilet and in the tub and shower. Do not use towel  bars as grab bars. Use non-skid mats or decals in the tub or shower. If you need to sit down in the shower, use a plastic, non-slip stool. Keep the floor dry. Clean up any water that spills on the floor as soon as it happens. Remove soap buildup in the tub or shower regularly. Attach bath mats securely with double-sided non-slip rug tape. Do not have throw rugs and other things on the floor that can make you trip. What can I do in the bedroom? Use night lights. Make sure that you have a light by your bed that is easy to reach. Do not use any sheets or blankets that are too big for your bed. They should not hang down onto the floor. Have a firm chair that has side arms. You can use this for support while you get dressed. Do not have throw rugs and other things on the floor that can make you trip. What can I do in the kitchen? Clean up any spills right away. Avoid walking on wet floors. Keep items that you use a lot in easy-to-reach places. If you need to reach something  above you, use a strong step stool that has a grab bar. Keep electrical cords out of the way. Do not use floor polish or wax that makes floors slippery. If you must use wax, use non-skid floor wax. Do not have throw rugs and other things on the floor that can make you trip. What can I do with my stairs? Do not leave any items on the stairs. Make sure that there are handrails on both sides of the stairs and use them. Fix handrails that are broken or loose. Make sure that handrails are as long as the stairways. Check any carpeting to make sure that it is firmly attached to the stairs. Fix any carpet that is loose or worn. Avoid having throw rugs at the top or bottom of the stairs. If you do have throw rugs, attach them to the floor with carpet tape. Make sure that you have a light switch at the top of the stairs and the bottom of the stairs. If you do not have them, ask someone to add them for you. What else can I do to help  prevent falls? Wear shoes that: Do not have high heels. Have rubber bottoms. Are comfortable and fit you well. Are closed at the toe. Do not wear sandals. If you use a stepladder: Make sure that it is fully opened. Do not climb a closed stepladder. Make sure that both sides of the stepladder are locked into place. Ask someone to hold it for you, if possible. Clearly mark and make sure that you can see: Any grab bars or handrails. First and last steps. Where the edge of each step is. Use tools that help you move around (mobility aids) if they are needed. These include: Canes. Walkers. Scooters. Crutches. Turn on the lights when you go into a dark area. Replace any light bulbs as soon as they burn out. Set up your furniture so you have a clear path. Avoid moving your furniture around. If any of your floors are uneven, fix them. If there are any pets around you, be aware of where they are. Review your medicines with your doctor. Some medicines can make you feel dizzy. This can increase your chance of falling. Ask your doctor what other things that you can do to help prevent falls. This information is not intended to replace advice given to you by your health care provider. Make sure you discuss any questions you have with your health care provider. Document Released: 04/25/2009 Document Revised: 12/05/2015 Document Reviewed: 08/03/2014 Elsevier Interactive Patient Education  2017 [employer].

## 2022-11-18 ENCOUNTER — Encounter: Payer: Self-pay | Admitting: Family Medicine

## 2022-11-18 ENCOUNTER — Ambulatory Visit (INDEPENDENT_AMBULATORY_CARE_PROVIDER_SITE_OTHER): Payer: Medicare HMO | Admitting: Family Medicine

## 2022-11-18 VITALS — BP 130/90 | HR 58 | Temp 98.0°F | Ht 65.0 in | Wt 192.8 lb

## 2022-11-18 DIAGNOSIS — C50411 Malignant neoplasm of upper-outer quadrant of right female breast: Secondary | ICD-10-CM

## 2022-11-18 DIAGNOSIS — I10 Essential (primary) hypertension: Secondary | ICD-10-CM

## 2022-11-18 DIAGNOSIS — E785 Hyperlipidemia, unspecified: Secondary | ICD-10-CM | POA: Diagnosis not present

## 2022-11-18 DIAGNOSIS — R079 Chest pain, unspecified: Secondary | ICD-10-CM | POA: Diagnosis not present

## 2022-11-18 DIAGNOSIS — I700 Atherosclerosis of aorta: Secondary | ICD-10-CM | POA: Diagnosis not present

## 2022-11-18 DIAGNOSIS — E034 Atrophy of thyroid (acquired): Secondary | ICD-10-CM

## 2022-11-18 DIAGNOSIS — Z170 Estrogen receptor positive status [ER+]: Secondary | ICD-10-CM

## 2022-11-18 LAB — CBC WITH DIFFERENTIAL/PLATELET
Basophils Absolute: 0 10*3/uL (ref 0.0–0.1)
Basophils Relative: 0.5 % (ref 0.0–3.0)
Eosinophils Absolute: 0.1 10*3/uL (ref 0.0–0.7)
Eosinophils Relative: 2 % (ref 0.0–5.0)
HCT: 43.3 % (ref 36.0–46.0)
Hemoglobin: 15 g/dL (ref 12.0–15.0)
Lymphocytes Relative: 50 % — ABNORMAL HIGH (ref 12.0–46.0)
Lymphs Abs: 3.2 10*3/uL (ref 0.7–4.0)
MCHC: 34.6 g/dL (ref 30.0–36.0)
MCV: 89.1 fl (ref 78.0–100.0)
Monocytes Absolute: 0.4 10*3/uL (ref 0.1–1.0)
Monocytes Relative: 6 % (ref 3.0–12.0)
Neutro Abs: 2.7 10*3/uL (ref 1.4–7.7)
Neutrophils Relative %: 41.5 % — ABNORMAL LOW (ref 43.0–77.0)
Platelets: 242 10*3/uL (ref 150.0–400.0)
RBC: 4.86 Mil/uL (ref 3.87–5.11)
RDW: 14.1 % (ref 11.5–15.5)
WBC: 6.4 10*3/uL (ref 4.0–10.5)

## 2022-11-18 LAB — COMPREHENSIVE METABOLIC PANEL
ALT: 49 U/L — ABNORMAL HIGH (ref 0–35)
AST: 41 U/L — ABNORMAL HIGH (ref 0–37)
Albumin: 4.5 g/dL (ref 3.5–5.2)
Alkaline Phosphatase: 76 U/L (ref 39–117)
BUN: 12 mg/dL (ref 6–23)
CO2: 24 mEq/L (ref 19–32)
Calcium: 9.7 mg/dL (ref 8.4–10.5)
Chloride: 103 mEq/L (ref 96–112)
Creatinine, Ser: 0.68 mg/dL (ref 0.40–1.20)
GFR: 86.96 mL/min (ref >60.00)
Glucose, Bld: 78 mg/dL (ref 70–99)
Potassium: 4.7 mEq/L (ref 3.5–5.1)
Sodium: 139 mEq/L (ref 135–145)
Total Bilirubin: 0.8 mg/dL (ref 0.2–1.2)
Total Protein: 7.1 g/dL (ref 6.0–8.3)

## 2022-11-18 LAB — LDL CHOLESTEROL, DIRECT: Direct LDL: 86 mg/dL

## 2022-11-18 LAB — LIPID PANEL
Cholesterol: 168 mg/dL (ref 0–200)
HDL: 51.5 mg/dL (ref >39.00)
NonHDL: 116.65
Total CHOL/HDL Ratio: 3
Triglycerides: 203 mg/dL — ABNORMAL HIGH (ref 0.0–149.0)
VLDL: 40.6 mg/dL — ABNORMAL HIGH (ref 0.0–40.0)

## 2022-11-18 LAB — TSH: TSH: 0.61 u[IU]/mL (ref 0.35–5.50)

## 2022-11-18 NOTE — Progress Notes (Signed)
Phone [phone] In person visit   Subjective:   Christine Leon is a 72 y.o. year old very pleasant female patient who presents for/with See problem oriented charting Chief Complaint  Patient presents with   Fatigue    Pt c/o fatigue lately    Past Medical History-  Patient Active Problem List   Diagnosis Date Noted   Malignant neoplasm of upper-outer quadrant of right breast in female, estrogen receptor positive (HCC) 04/22/2018    Priority: High   Hepatic steatosis 05/06/2021    Priority: Medium    Hyperlipidemia 05/06/2020    Priority: Medium    Aortic atherosclerosis (HCC) 04/15/2017    Priority: Medium    Hot flashes 08/15/2014    Priority: Medium    Obesity 08/15/2014    Priority: Medium    Hypothyroidism 07/19/2014    Priority: Medium    CELIAC SPRUE 04/20/2008    Priority: Medium    Essential hypertension 06/05/2007    Priority: Medium    History of adenomatous polyp of colon 06/07/2017    Priority: Low   Retinal tear 08/15/2014    Priority: Low   Left knee pain 07/25/2014    Priority: Low   Allergic rhinitis 07/20/2007    Priority: Low   Nausea without vomiting    Bilious vomiting with nausea    Hematochezia 03/31/2022   Abnormal CT scan, gastrointestinal tract    History of ischemic colitis    Ischemic colitis (HCC)    Syncope 03/30/2022   Colitis 03/30/2022   Diverticulosis 05/06/2021    Medications- reviewed and updated Current Outpatient Medications  Medication Sig Dispense Refill   ALPHA LIPOIC ACID PO Take 1 tablet by mouth daily.     anastrozole (ARIMIDEX) 1 MG tablet Take 1 tablet (1 mg total) by mouth daily. 90 tablet 4   Cholecalciferol (VITAMIN D-3 PO) Take 1,500 Units by mouth every evening.     fish oil-omega-3 fatty acids 1000 MG capsule Take 1 g by mouth every evening.     gabapentin (NEURONTIN) 100 MG capsule Take 1 capsule (100 mg total) by mouth 2 (two) times daily as needed (nerve pain). 180 capsule 3   gabapentin  (NEURONTIN) 300 MG capsule TAKE 1 CAPSULE BY MOUTH EVERY DAY AT BEDTIME 90 capsule 4   glucosamine-chondroitin 500-400 MG tablet Take 1 tablet by mouth daily.     levothyroxine (SYNTHROID) 112 MCG tablet TAKE 1 TABLET BY MOUTH EVERY DAY 90 tablet 3   lisinopril-hydrochlorothiazide (ZESTORETIC) 20-25 MG tablet TAKE 1 TABLET BY MOUTH EVERY DAY 90 tablet 3   Magnesium Oxide 400 MG CAPS Take 1 capsule (400 mg total) by mouth at bedtime.  0   metroNIDAZOLE (FLAGYL) 500 MG/100ML Inject 100 mLs (500 mg total) into the vein every 12 (twelve) hours. 6 mL 0   Multiple Vitamin (MULTIVITAMIN ADULT PO) Take 1 capsule by mouth daily.     rosuvastatin (CRESTOR) 10 MG tablet Take 1 tablet (10 mg total) by mouth 2 (two) times a week. 90 tablet 3   fluticasone (FLONASE) 50 MCG/ACT nasal spray Place 2 sprays into both nostrils daily. (Patient not taking: Reported on 11/18/2022) 16 g 6   ondansetron (ZOFRAN) 4 MG tablet Take 1 tablet (4 mg total) by mouth every 6 (six) hours as needed for nausea. (Patient not taking: Reported on 11/18/2022) 20 tablet 0   No current facility-administered medications for this visit.     Objective:  BP (!) 130/90   Pulse (!) 58  Temp 98 F (36.7 C)   Ht 5\' 5"  (1.651 m)   Wt 192 lb 12.8 oz (87.5 kg)   LMP  (LMP Unknown)   SpO2 95%   BMI 32.08 kg/m  Gen: NAD, resting comfortably CV: RRR no murmurs rubs or gallops No chest wall pain Lungs: CTAB no crackles, wheeze, rhonchi Abdomen: soft/nontender/nondistended/normal bowel sounds.   Ext: no edema Skin: warm, dry Neuro: grossly normal, moves all extremities   EKG: sinus rhythm with rate 66, normal axis, normal intervals, no hypertrophy, no st or t wave changes     Assessment and Plan   # Social update- getting porch added on with porch there too with fireplace (she did the designs! And got permits). Caregiving for husbad is hard- contributes to fatigue issues. Mom has bene ill sick twice and in hospital since january  #  Chest pain S:noted issues about 1.5 weeks ago. Could be rather intense at first even waking her from sleep- started under left breast and radiated to the back.  Had been active that day and stressed about issues listed above- pain started 10 pm (ate around 5-6) and was able to sleep but recurred again the next day and that evenign is when she had trouble sleeping due to pain. Thankfully has not come back at this point. Later moved into right chest and went into right arm- but no no shortness of breath or left arm or neck pain - pain felt like bad cramp but intense up to 8/10 -reassuring CT calcium scoring July 2023 of 7 A/P: atypical chest pain- we discussed possible referral to cardiology- since it has not recurred and she had reassuring CT calcium last year plus EKG today she wanted to hold off but agrees to reach out if recurrent issues  -no chest pain with yard work but does feel more fatigued and shortness of breath at times- noted over last month  # Breast cancer-follows closely with Dr. Darnelle Catalan or Dr. Pamelia Hoit. Yearly mammogram. Remains on arimidex - on almost 5 years - we wonder if could contribute to fatigue  #hypertension S: medication: Lisinopril hydrochlorothiazide 20-25 mg daily Home readings #s: no recent checks BP Readings from Last 3 Encounters:  11/18/22 (!) 130/90  05/20/22 124/70  05/19/22 (!) 171/75   A/P: blood pressure slightly high in office- asked her to monitor at home and update me in a week with home readings - goal at least <135/85  #hyperlipidemia with aortic atherosclerosis # Coronary calcium- Coronary calcium score of 7 in  july 2023 S: Medication:Rosuvastatin 10 mg twice a week -LDL at least under 100-technically LDL goal would be under 70 - at goal last visit Lab Results  Component Value Date   CHOL 128 11/25/2021   HDL 51.40 11/25/2021   LDLCALC 59 11/25/2021   LDLDIRECT 84.0 11/04/2020   TRIG 91.0 11/25/2021   CHOLHDL 2 11/25/2021     A/P: aortic  atherosclerosis (presumed stable)- LDL goal ideally <70 - same goal for coronary artery calcium findings- at goal last year- offered full repeat today- opts in    #hypothyroidism S: compliant On thyroid medication-levothyroxine 112 mcg  Lab Results  Component Value Date   TSH 0.41 11/25/2021  A/P:hopefully stable- update tsh today. Continue current meds for now    #Fatty liver with mild LFT elevations.  #s have looked good last 2 times- has worked on [employer]. Tough with life obligations to find time for exercise     Latest Ref Rng & Units  05/19/2022    8:46 AM 03/30/2022    5:45 PM 11/25/2021   10:41 AM  Hepatic Function  Total Protein 6.5 - 8.1 g/dL 7.0  6.8  6.9   Albumin 3.5 - 5.0 g/dL 4.4  4.1  4.4   AST 15 - 41 U/L 28  38  37   ALT 0 - 44 U/L 34  41  52   Alk Phosphatase 38 - 126 U/L 70  77  63   Total Bilirubin 0.3 - 1.2 mg/dL 0.7  0.8  0.8     #Celiac disease- in general avoiding but occasionally has french bread- discussed could cause fatigue- persists well past when she has had this  Recommended follow up: Return in about 6 months (around 05/21/2023) for physical or sooner if needed.Schedule b4 you leave. Future Appointments  Date Time Provider Department Center  05/20/2023 10:00 AM Serena Croissant, MD [facility] None  11/04/2023  2:00 PM [facility] ANNUAL WELLNESS VISIT 1 [facility]   Lab/Order associations: coffee with creamer   ICD-10-CM   1. Chest pain, unspecified type  R07.9 EKG 12-Lead    2. Essential hypertension  I10     3. Hyperlipidemia, unspecified hyperlipidemia type  E78.5 Comprehensive metabolic panel    CBC with Differential/Platelet    Lipid panel    4. Hypothyroidism due to acquired atrophy of thyroid  E03.4 TSH    5. Malignant neoplasm of upper-outer quadrant of right breast in female, estrogen receptor positive (HCC)  C50.411    Z17.0     6. Aortic atherosclerosis (HCC)  I70.0       No orders of the defined types were placed in this  encounter.   Return precautions advised.  Tana Conch, MD

## 2022-11-18 NOTE — Patient Instructions (Addendum)
Let us know when/if you get your [facility] or any other COVID vaccines at the pharmacy.  we discussed possible referral to cardiology- since it has not recurred and she had reassuring CT calcium last year plus EKG today she wanted to hold off but agrees to reach out if recurrent issues  - if you have worsening of pain- seek care immediately  blood pressure slightly high in office- asked her to monitor at home and update me in a week with home readings - goal at least <135/85  Please stop by lab before you go If you have mychart- we will send your results within 3 business days of [place] receiving them.  If you do not have mychart- we will call you about results within 5 business days of [place] receiving them.  *please also note that you will see labs on mychart as soon as they post. I will later go in and write notes on them- will say "notes from Dr. Durene Cal"

## 2022-11-28 ENCOUNTER — Other Ambulatory Visit: Payer: Self-pay | Admitting: Family Medicine

## 2022-12-20 ENCOUNTER — Other Ambulatory Visit: Payer: Self-pay | Admitting: Family Medicine

## 2023-01-12 ENCOUNTER — Ambulatory Visit: Payer: Medicare HMO | Admitting: Gastroenterology

## 2023-01-12 ENCOUNTER — Encounter: Payer: Self-pay | Admitting: Gastroenterology

## 2023-01-12 VITALS — BP 122/78 | HR 76 | Ht 65.5 in | Wt 192.2 lb

## 2023-01-12 DIAGNOSIS — R42 Dizziness and giddiness: Secondary | ICD-10-CM | POA: Diagnosis not present

## 2023-01-12 DIAGNOSIS — R110 Nausea: Secondary | ICD-10-CM

## 2023-01-12 DIAGNOSIS — R002 Palpitations: Secondary | ICD-10-CM | POA: Diagnosis not present

## 2023-01-12 DIAGNOSIS — R55 Syncope and collapse: Secondary | ICD-10-CM

## 2023-01-12 DIAGNOSIS — K219 Gastro-esophageal reflux disease without esophagitis: Secondary | ICD-10-CM | POA: Diagnosis not present

## 2023-01-12 MED ORDER — FAMOTIDINE 40 MG PO TABS: 40.0000 mg | ORAL_TABLET | Freq: Every day | ORAL | 2 refills | Status: DC

## 2023-01-12 NOTE — Patient Instructions (Addendum)
  We have sent the following medications to your pharmacy for you to pick up at your convenience: Famotidine- 40 mg once at bedtime   _______________________________________________________  If your blood pressure at your visit was 140/90 or greater, please contact your primary care physician to follow up on this.  _______________________________________________________  If you are age 73 or older, your body mass index should be between 23-30. Your Body mass index is 31.5 kg/m. If this is out of the aforementioned range listed, please consider follow up with your Primary Care Provider.  If you are age 34 or younger, your body mass index should be between 19-25. Your Body mass index is 31.5 kg/m. If this is out of the aformentioned range listed, please consider follow up with your Primary Care Provider.   ________________________________________________________  The [place] providers would like to encourage you to use [facility] to communicate with providers for non-urgent requests or questions.  Due to long hold times on the telephone, sending your provider a message by [facility] may be a faster and more efficient way to get a response.  Please allow 48 business hours for a response.  Please remember that this is for non-urgent requests.  _______________________________________________________ It was a pleasure to see you today!  Thank you for trusting me with your gastrointestinal care!
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
## 2023-01-12 NOTE — Progress Notes (Signed)
Agree with assessment and plan as outlined.  

## 2023-02-10 DIAGNOSIS — H1032 Unspecified acute conjunctivitis, left eye: Secondary | ICD-10-CM | POA: Diagnosis not present

## 2023-02-25 DIAGNOSIS — H2513 Age-related nuclear cataract, bilateral: Secondary | ICD-10-CM | POA: Diagnosis not present

## 2023-02-25 DIAGNOSIS — H31003 Unspecified chorioretinal scars, bilateral: Secondary | ICD-10-CM | POA: Diagnosis not present

## 2023-02-25 DIAGNOSIS — H5213 Myopia, bilateral: Secondary | ICD-10-CM | POA: Diagnosis not present

## 2023-03-06 DIAGNOSIS — N189 Chronic kidney disease, unspecified: Secondary | ICD-10-CM | POA: Diagnosis not present

## 2023-03-06 DIAGNOSIS — K760 Fatty (change of) liver, not elsewhere classified: Secondary | ICD-10-CM | POA: Diagnosis not present

## 2023-03-06 DIAGNOSIS — K219 Gastro-esophageal reflux disease without esophagitis: Secondary | ICD-10-CM | POA: Diagnosis not present

## 2023-03-06 DIAGNOSIS — I739 Peripheral vascular disease, unspecified: Secondary | ICD-10-CM | POA: Diagnosis not present

## 2023-03-06 DIAGNOSIS — K900 Celiac disease: Secondary | ICD-10-CM | POA: Diagnosis not present

## 2023-03-06 DIAGNOSIS — E785 Hyperlipidemia, unspecified: Secondary | ICD-10-CM | POA: Diagnosis not present

## 2023-03-06 DIAGNOSIS — I129 Hypertensive chronic kidney disease with stage 1 through stage 4 chronic kidney disease, or unspecified chronic kidney disease: Secondary | ICD-10-CM | POA: Diagnosis not present

## 2023-03-06 DIAGNOSIS — M792 Neuralgia and neuritis, unspecified: Secondary | ICD-10-CM | POA: Diagnosis not present

## 2023-03-06 DIAGNOSIS — Z87891 Personal history of nicotine dependence: Secondary | ICD-10-CM | POA: Diagnosis not present

## 2023-03-06 DIAGNOSIS — Z8249 Family history of ischemic heart disease and other diseases of the circulatory system: Secondary | ICD-10-CM | POA: Diagnosis not present

## 2023-03-06 DIAGNOSIS — E669 Obesity, unspecified: Secondary | ICD-10-CM | POA: Diagnosis not present

## 2023-03-06 DIAGNOSIS — C50919 Malignant neoplasm of unspecified site of unspecified female breast: Secondary | ICD-10-CM | POA: Diagnosis not present

## 2023-04-11 ENCOUNTER — Other Ambulatory Visit: Payer: Self-pay | Admitting: Gastroenterology

## 2023-04-15 ENCOUNTER — Ambulatory Visit: Payer: Medicare HMO | Admitting: Gastroenterology

## 2023-05-20 ENCOUNTER — Inpatient Hospital Stay: Payer: Medicare HMO | Attending: Hematology and Oncology | Admitting: Hematology and Oncology

## 2023-05-20 ENCOUNTER — Other Ambulatory Visit: Payer: Self-pay | Admitting: Hematology and Oncology

## 2023-05-20 VITALS — BP 155/62 | HR 83 | Temp 97.7°F | Resp 18 | Ht 65.5 in | Wt 192.7 lb

## 2023-05-20 DIAGNOSIS — C50411 Malignant neoplasm of upper-outer quadrant of right female breast: Secondary | ICD-10-CM

## 2023-05-20 DIAGNOSIS — G629 Polyneuropathy, unspecified: Secondary | ICD-10-CM | POA: Diagnosis not present

## 2023-05-20 DIAGNOSIS — Z170 Estrogen receptor positive status [ER+]: Secondary | ICD-10-CM | POA: Diagnosis not present

## 2023-05-20 DIAGNOSIS — Z79811 Long term (current) use of aromatase inhibitors: Secondary | ICD-10-CM | POA: Insufficient documentation

## 2023-05-20 DIAGNOSIS — Z923 Personal history of irradiation: Secondary | ICD-10-CM | POA: Insufficient documentation

## 2023-05-20 MED ORDER — ANASTROZOLE 1 MG PO TABS: 1.0000 mg | ORAL_TABLET | Freq: Every day | ORAL | 3 refills | Status: DC

## 2023-05-20 NOTE — Assessment & Plan Note (Signed)
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   Breast cancer surveillance: Breast exam 05/20/2023: Benign Mammogram 06/09/2022: Benign breast density category B Return to clinic in 1 year for follow-up.

## 2023-05-20 NOTE — Progress Notes (Signed)
Patient Care Team: Shelva Majestic, MD as PCP - General (Family Medicine) [place], Peter M, MD as PCP - Cardiology (Cardiology) Emelia Loron, MD as Consulting Physician (General Surgery) Magrinat, Valentino Hue, MD (Inactive) as Consulting Physician (Oncology) Lonie Peak, MD as Attending Physician (Radiation Oncology) Armbruster, Willaim Rayas, MD as Consulting Physician (Gastroenterology) Dahlia Byes, [facility] (Inactive) as Pharmacist (Pharmacist)  DIAGNOSIS:  Encounter Diagnosis  Name Primary?   Malignant neoplasm of upper-outer quadrant of right breast in female, estrogen receptor positive (HCC) Yes    SUMMARY OF ONCOLOGIC HISTORY: Oncology History  Malignant neoplasm of upper-outer quadrant of right breast in female, estrogen receptor positive (HCC)  07/27/2017 - 08/06/2017 Anti-estrogen oral therapy   Tamoxifen daily, discontinued due to rash   04/22/2018 Initial Diagnosis   Christine Leon, [place] woman status post right breast upper outer quadrant biopsy for a clinical T1a N0, stage IA invasive ductal carcinoma, grade 1, estrogen and progesterone receptor positive, HER-2 not amplified, with an MIB-1 of 3%.          04/27/2018 Surgery   status post right lumpectomy and sentinel lymph node sampling for a pT1c pN0, stage IA invasive ductal carcinoma, grade 1, with negative margins             (a) 1 sentinel lymph node was removed   04/27/2018 Oncotype testing   Oncotype DX score of 17 predicts a risk of recurrence outside the breast over 9 years of 5% if the patient's only systemic therapy is an antiestrogen for 5 years.  It also predicts no benefit from chemotherapy   05/11/2018 Cancer Staging   Staging form: Breast, AJCC 8th Edition - Clinical: Stage IA (cT1c, cN0, cM0, G1, ER+, PR+, HER2-) - Signed by Lonie Peak, MD on 05/11/2018   05/13/2018 Cancer Staging   Staging form: Breast, AJCC 8th Edition - Pathologic: Stage IA (pT1c, pN0, cM0, G1, ER+, PR+, HER2-) - Signed by  Lonie Peak, MD on 05/13/2018   05/26/2018 - 06/29/2018 Radiation Therapy     1. Right breast, 2.67 Gy x 15 fractions for a total dose of 40.05 Gy  2. Boost, 2 Gy x 5 fractions for a total dose of 10 Gy   08/31/2018 -  Anti-estrogen oral therapy   Anastrozole daily     CHIEF COMPLIANT: Follow-up on anastrozole therapy  HISTORY OF PRESENT ILLNESS:   History of Present Illness   The patient, with a history of breast cancer, presents for a routine follow-up. She was diagnosed with breast cancer in October 2019 and has been on an anti estrogen therapy for nearly five years. She reports recent hot flashes, which she attributes to living with her 73 year old mother who prefers a warmer environment. She also experiences joint stiffness and achiness, which she manages with glucosamine chondroitin. She is unsure if these symptoms are related to her celiac disease or another condition. She expresses willingness to continue her current medication for two more years, making a total of seven years of treatment.  In addition to her prescribed medication, she takes a supplement called Balance of [employer], which contains fruits and vegetables. She reports feeling a noticeable difference in her energy levels and activity when she does not take this supplement. She also has neuropathy in her feet, which causes discomfort, particularly at night after walking. She is considering water aerobics to alleviate the discomfort.         ALLERGIES:  is allergic to gluten meal, wheat, and cephalexin.  MEDICATIONS:  Current Outpatient Medications  Medication Sig Dispense Refill   ALPHA LIPOIC ACID PO Take 1 tablet by mouth daily.     anastrozole (ARIMIDEX) 1 MG tablet Take 1 tablet (1 mg total) by mouth daily. 90 tablet 3   Cholecalciferol (VITAMIN D-3 PO) Take 1,500 Units by mouth every evening.     famotidine (PEPCID) 40 MG tablet Take 1 tablet (40 mg total) by mouth daily. 30 tablet 2   fish oil-omega-3 fatty  acids 1000 MG capsule Take 1 g by mouth every evening.     fluticasone (FLONASE) 50 MCG/ACT nasal spray Place 2 sprays into both nostrils daily. 16 g 6   gabapentin (NEURONTIN) 100 MG capsule Take 1 capsule (100 mg total) by mouth 2 (two) times daily as needed (nerve pain). 180 capsule 3   gabapentin (NEURONTIN) 300 MG capsule TAKE 1 CAPSULE BY MOUTH EVERY DAY AT BEDTIME 90 capsule 4   glucosamine-chondroitin 500-400 MG tablet Take 1 tablet by mouth daily.     levothyroxine (SYNTHROID) 112 MCG tablet TAKE 1 TABLET BY MOUTH EVERY DAY 90 tablet 3   lisinopril-hydrochlorothiazide (ZESTORETIC) 20-25 MG tablet TAKE 1 TABLET BY MOUTH EVERY DAY 90 tablet 3   Magnesium Oxide 400 MG CAPS Take 1 capsule (400 mg total) by mouth at bedtime.  0   metroNIDAZOLE (FLAGYL) 500 MG/100ML Inject 100 mLs (500 mg total) into the vein every 12 (twelve) hours. 6 mL 0   Multiple Vitamin (MULTIVITAMIN ADULT PO) Take 1 capsule by mouth daily.     ondansetron (ZOFRAN) 4 MG tablet Take 1 tablet (4 mg total) by mouth every 6 (six) hours as needed for nausea. 20 tablet 0   rosuvastatin (CRESTOR) 10 MG tablet TAKE 1 TABLET (10 MG TOTAL) BY MOUTH 2 (TWO) TIMES A WEEK. 24 tablet 14   No current facility-administered medications for this visit.    PHYSICAL EXAMINATION: ECOG PERFORMANCE STATUS: 1 - Symptomatic but completely ambulatory  Vitals:   05/20/23 0949  BP: (!) 155/62  Pulse: 83  Resp: 18  Temp: 97.7 F (36.5 C)  SpO2: 98%   Filed Weights   05/20/23 0949  Weight: 192 lb 11.2 oz (87.4 kg)    Physical Exam no palpable lumps or nodules in the breast        (exam performed in the presence of a chaperone)  LABORATORY DATA:  I have reviewed the data as listed    Latest Ref Rng & Units 11/18/2022   12:23 PM 05/19/2022    8:46 AM 04/02/2022    5:59 AM  CMP  Glucose 70 - 99 mg/dL 78  416  98   BUN 6 - 23 mg/dL 12  14  7    Creatinine 0.40 - 1.20 mg/dL 6.06  3.01  6.01   Sodium 135 - 145 mEq/L 139  141  142    Potassium 3.5 - 5.1 mEq/L 4.7  3.7  3.3   Chloride 96 - 112 mEq/L 103  106  112   CO2 19 - 32 mEq/L 24  27  24    Calcium 8.4 - 10.5 mg/dL 9.7  9.8  8.6   Total Protein 6.0 - 8.3 g/dL 7.1  7.0    Total Bilirubin 0.2 - 1.2 mg/dL 0.8  0.7    Alkaline Phos 39 - 117 U/L 76  70    AST 0 - 37 U/L 41  28    ALT 0 - 35 U/L 49  34      Lab Results  Component Value Date  WBC 6.4 11/18/2022   HGB 15.0 11/18/2022   HCT 43.3 11/18/2022   MCV 89.1 11/18/2022   PLT 242.0 11/18/2022   NEUTROABS 2.7 11/18/2022    ASSESSMENT & PLAN:  Malignant neoplasm of upper-outer quadrant of right breast in female, estrogen receptor positive (HCC) 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   Breast cancer surveillance: Breast exam 05/20/2023: Benign Mammogram 06/09/2022: Benign breast density category B Return to clinic in 1 year for follow-up.       No orders of the defined types were placed in this encounter.  The patient has a good understanding of the overall plan. she agrees with it. she will call with any problems that may develop before the next visit here. Total time spent: 30 mins including face to face time and time spent for planning, charting and co-ordination of care   Tamsen Meek, MD 05/20/23

## 2023-05-25 DIAGNOSIS — R69 Illness, unspecified: Secondary | ICD-10-CM | POA: Diagnosis not present

## 2023-06-02 DIAGNOSIS — R69 Illness, unspecified: Secondary | ICD-10-CM | POA: Diagnosis not present

## 2023-06-08 ENCOUNTER — Encounter: Payer: Self-pay | Admitting: Family Medicine

## 2023-06-08 ENCOUNTER — Ambulatory Visit (INDEPENDENT_AMBULATORY_CARE_PROVIDER_SITE_OTHER): Payer: Medicare HMO | Admitting: Family Medicine

## 2023-06-08 VITALS — BP 130/82 | HR 66 | Temp 98.0°F | Ht 65.5 in | Wt 194.4 lb

## 2023-06-08 DIAGNOSIS — I10 Essential (primary) hypertension: Secondary | ICD-10-CM | POA: Diagnosis not present

## 2023-06-08 DIAGNOSIS — K760 Fatty (change of) liver, not elsewhere classified: Secondary | ICD-10-CM

## 2023-06-08 DIAGNOSIS — R202 Paresthesia of skin: Secondary | ICD-10-CM

## 2023-06-08 DIAGNOSIS — Z0000 Encounter for general adult medical examination without abnormal findings: Secondary | ICD-10-CM | POA: Diagnosis not present

## 2023-06-08 DIAGNOSIS — E669 Obesity, unspecified: Secondary | ICD-10-CM | POA: Diagnosis not present

## 2023-06-08 DIAGNOSIS — E034 Atrophy of thyroid (acquired): Secondary | ICD-10-CM | POA: Diagnosis not present

## 2023-06-08 DIAGNOSIS — R0602 Shortness of breath: Secondary | ICD-10-CM

## 2023-06-08 DIAGNOSIS — Z131 Encounter for screening for diabetes mellitus: Secondary | ICD-10-CM

## 2023-06-08 DIAGNOSIS — E785 Hyperlipidemia, unspecified: Secondary | ICD-10-CM | POA: Diagnosis not present

## 2023-06-08 LAB — COMPREHENSIVE METABOLIC PANEL
ALT: 42 U/L — ABNORMAL HIGH (ref 0–35)
AST: 32 U/L (ref 0–37)
Albumin: 4.7 g/dL (ref 3.5–5.2)
Alkaline Phosphatase: 65 U/L (ref 39–117)
BUN: 10 mg/dL (ref 6–23)
CO2: 30 meq/L (ref 19–32)
Calcium: 10.1 mg/dL (ref 8.4–10.5)
Chloride: 103 meq/L (ref 96–112)
Creatinine, Ser: 0.74 mg/dL (ref 0.40–1.20)
GFR: 80.47 mL/min (ref >60.00)
Glucose, Bld: 85 mg/dL (ref 70–99)
Potassium: 4.1 meq/L (ref 3.5–5.1)
Sodium: 143 meq/L (ref 135–145)
Total Bilirubin: 0.8 mg/dL (ref 0.2–1.2)
Total Protein: 7.1 g/dL (ref 6.0–8.3)

## 2023-06-08 LAB — URINALYSIS, ROUTINE W REFLEX MICROSCOPIC
Bilirubin Urine: NEGATIVE
Hgb urine dipstick: NEGATIVE
Ketones, ur: NEGATIVE
Nitrite: NEGATIVE
Specific Gravity, Urine: 1.01 (ref 1.000–1.030)
Total Protein, Urine: NEGATIVE
Urine Glucose: NEGATIVE
Urobilinogen, UA: 0.2 (ref 0.0–1.0)
pH: 6 (ref 5.0–8.0)

## 2023-06-08 LAB — LIPID PANEL
Cholesterol: 152 mg/dL (ref 0–200)
HDL: 52.3 mg/dL (ref >39.00)
LDL Cholesterol: 71 mg/dL (ref 0–99)
NonHDL: 99.38
Total CHOL/HDL Ratio: 3
Triglycerides: 144 mg/dL (ref 0.0–149.0)
VLDL: 28.8 mg/dL (ref 0.0–40.0)

## 2023-06-08 LAB — CBC WITH DIFFERENTIAL/PLATELET
Basophils Absolute: 0.1 10*3/uL (ref 0.0–0.1)
Basophils Relative: 0.6 % (ref 0.0–3.0)
Eosinophils Absolute: 0.1 10*3/uL (ref 0.0–0.7)
Eosinophils Relative: 1.5 % (ref 0.0–5.0)
HCT: 44.1 % (ref 36.0–46.0)
Hemoglobin: 14.8 g/dL (ref 12.0–15.0)
Lymphocytes Relative: 35.8 % (ref 12.0–46.0)
Lymphs Abs: 3.3 10*3/uL (ref 0.7–4.0)
MCHC: 33.6 g/dL (ref 30.0–36.0)
MCV: 90.7 fL (ref 78.0–100.0)
Monocytes Absolute: 0.6 10*3/uL (ref 0.1–1.0)
Monocytes Relative: 6.8 % (ref 3.0–12.0)
Neutro Abs: 5.2 10*3/uL (ref 1.4–7.7)
Neutrophils Relative %: 55.3 % (ref 43.0–77.0)
Platelets: 253 10*3/uL (ref 150.0–400.0)
RBC: 4.86 Mil/uL (ref 3.87–5.11)
RDW: 14.2 % (ref 11.5–15.5)
WBC: 9.3 10*3/uL (ref 4.0–10.5)

## 2023-06-08 LAB — TSH: TSH: 0.5 u[IU]/mL (ref 0.35–5.50)

## 2023-06-08 LAB — HEMOGLOBIN A1C: Hgb A1c MFr Bld: 5.4 % (ref 4.6–6.5)

## 2023-06-08 LAB — VITAMIN B12: Vitamin B-12: 308 pg/mL (ref 211–911)

## 2023-06-08 NOTE — Progress Notes (Signed)
Phone [phone]   Subjective:  Patient presents today for their annual physical. Chief complaint-noted.   See problem oriented charting- ROS- full  review of systems was completed and negative except for: tingling and numbness in toes, rlq pain, shoulder blade pain on right  The following were reviewed and entered/updated in epic: Past Medical History:  Diagnosis Date   Allergy    Celiac sprue    History of radiation therapy 05/26/18- 06/29/18   Right Breast 15 fractions for a total dose of 40.05 Gy. Right Breast boost 5 fractions for a total dose of 10 Gy   Hypertension    Hypothyroidism    Personal history of radiation therapy 2019   PONV (postoperative nausea and vomiting)    Patient Active Problem List   Diagnosis Date Noted   Malignant neoplasm of upper-outer quadrant of right breast in female, estrogen receptor positive (HCC) 04/22/2018    Priority: High   History of ischemic colitis     Priority: Medium    Hepatic steatosis 05/06/2021    Priority: Medium    Hyperlipidemia 05/06/2020    Priority: Medium    Aortic atherosclerosis (HCC) 04/15/2017    Priority: Medium    Hot flashes 08/15/2014    Priority: Medium    Obesity 08/15/2014    Priority: Medium    Hypothyroidism 07/19/2014    Priority: Medium    CELIAC SPRUE 04/20/2008    Priority: Medium    Essential hypertension 06/05/2007    Priority: Medium    Syncope 03/30/2022    Priority: Low   Diverticulosis 05/06/2021    Priority: Low   History of adenomatous polyp of colon 06/07/2017    Priority: Low   Retinal tear 08/15/2014    Priority: Low   Left knee pain 07/25/2014    Priority: Low   Allergic rhinitis 07/20/2007    Priority: Low   Past Surgical History:  Procedure Laterality Date   ABDOMINAL HYSTERECTOMY     in 15s. fibroids. including cervix removal. per patient was told no further pap smears after.    arthroscopic knee surgery     2015 under Dr. Thomasena Edis   BREAST BIOPSY Right 04/01/2018    BREAST LUMPECTOMY Right 2019   BREAST LUMPECTOMY WITH RADIOACTIVE SEED AND SENTINEL LYMPH NODE BIOPSY Right 04/27/2018   Procedure: RIGHT BREAST LUMPECTOMY WITH RADIOACTIVE SEED AND RIGHT AXILLARYSENTINEL LYMPH NODE BIOPSY ERAS PATHWAY;  Surgeon: Emelia Loron, MD;  Location: [facility];  Service: General;  Laterality: Right;  PECTORAL BLOCK   CHOLECYSTECTOMY     COCCYX REMOVAL     after cheerleading accident   OOPHORECTOMY     in 63s   TONSILLECTOMY      Family History  Problem Relation Age of Onset   Hypertension Mother    Coronary artery disease Father        82   COPD Father        passed age 36   Pulmonary fibrosis Father        started in 88s   Idiopathic pulmonary fibrosis Father    Breast cancer Maternal Aunt    Pulmonary fibrosis Paternal Aunt    Stomach cancer Cousin        Paternal   Colon cancer Neg Hx    Esophageal cancer Neg Hx    Pancreatic cancer Neg Hx    Liver disease Neg Hx     Medications- reviewed and updated Current Outpatient Medications  Medication Sig Dispense Refill   ALPHA  LIPOIC ACID PO Take 1 tablet by mouth daily.     anastrozole (ARIMIDEX) 1 MG tablet Take 1 tablet (1 mg total) by mouth daily. 90 tablet 3   Cholecalciferol (VITAMIN D-3 PO) Take 1,500 Units by mouth every evening.     famotidine (PEPCID) 40 MG tablet TAKE 1 TABLET BY MOUTH EVERY DAY 90 tablet 3   fish oil-omega-3 fatty acids 1000 MG capsule Take 1 g by mouth every evening.     fluticasone (FLONASE) 50 MCG/ACT nasal spray Place 2 sprays into both nostrils daily. 16 g 6   gabapentin (NEURONTIN) 100 MG capsule Take 1 capsule (100 mg total) by mouth 2 (two) times daily as needed (nerve pain). 180 capsule 3   gabapentin (NEURONTIN) 300 MG capsule TAKE 1 CAPSULE BY MOUTH EVERY DAY AT BEDTIME 90 capsule 4   glucosamine-chondroitin 500-400 MG tablet Take 1 tablet by mouth daily.     levothyroxine (SYNTHROID) 112 MCG tablet TAKE 1 TABLET BY MOUTH EVERY DAY 90  tablet 3   lisinopril-hydrochlorothiazide (ZESTORETIC) 20-25 MG tablet TAKE 1 TABLET BY MOUTH EVERY DAY 90 tablet 3   Magnesium Oxide 400 MG CAPS Take 1 capsule (400 mg total) by mouth at bedtime.  0   Multiple Vitamin (MULTIVITAMIN ADULT PO) Take 1 capsule by mouth daily.     rosuvastatin (CRESTOR) 10 MG tablet TAKE 1 TABLET (10 MG TOTAL) BY MOUTH 2 (TWO) TIMES A WEEK. 24 tablet 14   No current facility-administered medications for this visit.    Allergies-reviewed and updated Allergies  Allergen Reactions   Gluten Meal Other (See Comments)    Has CELIAC DISEASE   Wheat Other (See Comments)    Has CELIAC DISEASE   Cephalexin Hives    Social History   Social History Narrative   Married (husband patient of Dr. Durene Cal), 2 children, 1 grandchild   Her mom lives with her. Dad passed 2017- had lived with them.       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 Oropharynx normal Neck: no thyromegaly CV: RRR no murmurs rubs or gallops Lungs: CTAB no crackles, wheeze, rhonchi Abdomen: soft/nontender except pain deep palpation in rlq/nondistended/normal bowel sounds. No rebound or guarding.  Ext: no edema Skin: warm, dry Neuro: grossly normal, moves all extremities, PERRLA   Assessment and Plan   72 y.o. female presenting for annual physical.  Health Maintenance counseling: 1. Anticipatory guidance: Patient counseled regarding regular dental exams -q6 months, eye exams-at least yearly with history of torn retina,  avoiding smoking and second hand smoke , limiting alcohol to 1 beverage per day , no illicit drugs .   2. Risk factor reduction:  Advised patient of need for regular exercise and diet  rich and fruits and vegetables to reduce risk of heart attack and stroke.  Exercise- trainer for 6-7 months but hurt back and been big set back. Walking has been low lately- encouraged to start. Ellipitical in garage she could use. 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 Does two meals a day for most part and hard to lose weight.  Wt Readings from Last 3 Encounters:  06/08/23 194 lb 6.4 oz (88.2 kg)  05/20/23 192 lb 11.2 oz (87.4 kg)  01/12/23 192 lb 3.4 oz (87.2 kg)  3. Immunizations/screenings/ancillary studies-discussed Shingrix at pharmacy, declines COVID-19 and flu shot  Immunization History  Administered Date(s) Administered   Fluad Quad(high Dose 65+) 05/04/2019   Influenza, High Dose Seasonal PF 05/21/2016, 04/19/2018   PFIZER(Purple Top)SARS-COV-2 Vaccination 08/03/2019, 08/22/2019, 05/03/2020   Pneumococcal Conjugate-13 04/14/2016   Pneumococcal Polysaccharide-23 04/15/2017   Td 11/07/2008   Tdap 11/24/2013   Zoster, Live 06/02/2013  4. Cervical cancer screening-  hysterectomy and the past age based screening recommendations.  No vaginal discharge or bleeding-we will defer pelvic exam  5. Breast cancer screening-  Breast cancer follow-up-patient with ongoing treatment plan of Arimidex after prior treatments.  and mammogram 06/09/2022 and now due- will schedule -following with Dr. Pamelia Hoit with last visit 05/20/2023  6. Colon cancer screening -colon polyps with Benancio Deeds, MD -recall for colonoscopy 7 years- adenoma.  She will be due 12/25/2028  7. Skin cancer screening- Dr. Donzetta Starch as needed.  advised regular sunscreen use. Denies worrisome, changing, or new skin lesions- has one spot on right arm looks like seborrheic keratosis- plans to schedule follow up .  8. Birth control/STD check- monogamous and postmenopausal  9. Osteoporosis  screening at 65- DEXA 04/07/19-normal with Dr. Darnelle Catalan in 2020. Excellent bone density  -Former smoker-quit 2008. Will check UA with labs . 39 pack years and discussed lung cancer screening program but would not qualify with ongoing breast cancer treatment.    Status of chronic or acute concerns   # Tingling and numbness noted in her toes starting6-7 months ago at least. Also irritation/ain doesn't want sheets to touch. Dad had similar and several other family members -turmeric, fish oil, and D3 have helped- seems to calm feet down- takes twice a day -feels under control but may reach out if worsens - good feet store insoles were helpful  #RLQ pain- started 3 weeks ago as a sharp pain and that improved but has a lingering pain if presses on area. Still has appendix . No fever. Will check labs and if concerns or worsening or persistent symptoms could pursue imaging - from avs "Since some improvement in abdominal pain- wanted to monitor unless labwork looks worse such as white blood count- if it persists and no further improvement you can reach back out and we can order further imaging"  #other musculoskeletal concern  - also had a cramp in right shoulder blade that went into arm in recent days but massage gun was helpful. Did note she has been drawing on drafting table and wonders if posiiton related  #could be deconditioning related- but feels gets more fatigued with activity- family history of pulmonary fibrosis in 66s- x-ray about 18 months ago without obvious changes - we talked about a graduated exercise program and if not improving we could potentially repeat CXR  -looking back did have "Mild left basilar atelectasis or infiltrate is noted with small left pleural effusion.  "- we opted to go ahead and repeat chest x-ray as result  #hypertension S: medication: Lisinopril hydrochlorothiazide 20-25 mg daily BP Readings from Last 3 Encounters:  06/08/23 130/82  05/20/23 (!) 155/62   01/12/23 122/78  A/P: stable- continue current medicines   #hyperlipidemia with aortic atherosclerosis # Coronary calcium-  Coronary calcium score of 7 in  july 2023 S: Medication:Rosuvastatin 10 mg twice a week -LDL at least under 100-technically LDL goal would be under 70 Lab Results  Component Value Date   CHOL 168 11/18/2022   HDL 51.50 11/18/2022   LDLCALC 59 11/25/2021   LDLDIRECT 86.0 11/18/2022   TRIG 203.0 (H) 11/18/2022   CHOLHDL 3 11/18/2022   A/P: scores looked good last time with LDL under 70 continue current medications and wants to hold off on full lipids today Aortic atherosclerosis (presumed stable)- LDL goal ideally <70 - same for coronary calcium    #hypothyroidism S: compliant On thyroid medication-levothyroxine 112 mcg  Lab Results  Component Value Date   TSH 0.61 11/18/2022  A/P:hopefully stable- update tsh today. Continue current meds for now    #Fatty liver with mild LFT elevations.  We have discussed working on lifestyle changes again today    #Celiac disease- noted 2023 to be active- avoiding gluten lately and doing better  Recommended follow up: Return in about 6 months (around 12/06/2023) for followup or sooner if needed.Schedule b4 you leave. Future Appointments  Date Time Provider Department Center  11/04/2023  2:00 PM [facility] ANNUAL WELLNESS VISIT 1 [facility]  05/22/2024 10:15 AM Serena Croissant, MD [facility] None   Lab/Order associations: fasting   ICD-10-CM   1. Preventative health care  Z00.00     2. Essential hypertension  I10 Urinalysis, Routine w reflex microscopic    3. Hyperlipidemia, unspecified hyperlipidemia type  E78.5 Comprehensive metabolic panel    CBC with Differential/Platelet    Lipid panel    Urinalysis, Routine w reflex microscopic    4. Hypothyroidism due to acquired atrophy of thyroid  E03.4 TSH    5. Hepatic steatosis  K76.0     6. Screening for diabetes mellitus  Z13.1 Hemoglobin A1c    7. Obesity (BMI  30-39.9)  E66.9 Hemoglobin A1c    8. Paresthesias  R20.2 Vitamin B12    9. SOB (shortness of breath)  R06.02 DG Chest 2 View      No orders of the defined types were placed in this encounter.   Return precautions advised.  Tana Conch, MD

## 2023-06-08 NOTE — Patient Instructions (Addendum)
If you decide want to see neurology for likely neuropathy let me know  I like your idea of trying to add a family activity of walking together- would be great for both of you even if just 20 minutes twice a week through end of January  Since some improvement in abdominal pain- wanted to monitor unless labwork looks worse such as white blood count- if it persists and no further improvement you can reach back out and we can order further imaging  Please go to [facility] (updated 09/07/2019) - located [address] across the street from [place] - in the basement - Hours: 8:30-5:00 PM M-F (with lunch from 12:30- 1 PM). You do NOT need an appointment.    Please stop by lab before you go If you have mychart- we will send your results within 3 business days of [place] receiving them.  If you do not have mychart- we will call you about results within 5 business days of [place] receiving them.  *please also note that you will see labs on mychart as soon as they post. I will later go in and write notes on them- will say "notes from Dr. Durene Cal"   Recommended follow up: Return in about 6 months (around 12/06/2023) for followup or sooner if needed.Schedule b4 you leave.

## 2023-07-26 ENCOUNTER — Other Ambulatory Visit: Payer: Self-pay | Admitting: Family Medicine

## 2023-08-27 ENCOUNTER — Other Ambulatory Visit: Payer: Self-pay | Admitting: Family Medicine

## 2023-09-10 ENCOUNTER — Other Ambulatory Visit: Payer: Self-pay | Admitting: Family Medicine

## 2023-11-25 ENCOUNTER — Ambulatory Visit (INDEPENDENT_AMBULATORY_CARE_PROVIDER_SITE_OTHER): Payer: Self-pay

## 2023-11-25 VITALS — Ht 65.5 in | Wt 194.0 lb

## 2023-11-25 DIAGNOSIS — Z0000 Encounter for general adult medical examination without abnormal findings: Secondary | ICD-10-CM

## 2023-11-25 NOTE — Progress Notes (Signed)
 Subjective:   Christine Leon is a 73 y.o. who presents for a Medicare Wellness preventive visit.  As a reminder, Annual Wellness Visits don't include a physical exam, and some assessments may be limited, especially if this visit is performed virtually. We may recommend an in-person visit if needed.  Visit Complete: Virtual I connected with  Christine Leon on 11/25/23 by a audio enabled telemedicine application and verified that I am speaking with the correct person using two identifiers.  Patient Location: Home  Provider Location: Office/Clinic  I discussed the limitations of evaluation and management by telemedicine. The patient expressed understanding and agreed to proceed.  Vital Signs: Because this visit was a virtual/telehealth visit, some criteria may be missing or patient reported. Any vitals not documented were not able to be obtained and vitals that have been documented are patient reported.  VideoDeclined- This patient declined Librarian, academic. Therefore the visit was completed with audio only.  Persons Participating in Visit: Patient.  AWV Questionnaire: No: Patient Medicare AWV questionnaire was not completed prior to this visit.  Cardiac Risk Factors include: advanced age (>84men, >50 women);hypertension;obesity (BMI >30kg/m2)     Objective:     Today's Vitals   11/25/23 1303 11/25/23 1305  Weight: 194 lb (88 kg)   Height: 5' 5.5" (1.664 m)   PainSc:  6    Body mass index is 31.79 kg/m.     11/25/2023    1:11 PM 07/11/2021    3:23 PM 05/05/2021   11:04 AM 04/13/2019    8:06 AM 08/03/2018    3:30 PM 05/13/2018   11:03 AM 05/11/2018    1:49 PM  Advanced Directives  Does Patient Have a Medical Advance Directive? Yes Yes No Yes Yes Yes Yes  Type of Estate agent of [place];Living will Healthcare Power of [employer] of [place];Living will Healthcare Power of [place];Living will  Healthcare Power of [place];Living will Healthcare Power of [place];Living will  Copy of Healthcare Power of Attorney in Chart? No - copy requested Yes - validated most recent copy scanned in chart (See row information)   No - copy requested No - copy requested No - copy requested  Would patient like information on creating a medical advance directive?      No - Patient declined No - Patient declined    Current Medications (verified) Outpatient Encounter Medications as of 11/25/2023  Medication Sig   ALPHA LIPOIC ACID PO Take 1 tablet by mouth daily.   anastrozole  (ARIMIDEX ) 1 MG tablet Take 1 tablet (1 mg total) by mouth daily.   Cholecalciferol (VITAMIN D -3 PO) Take 1,500 Units by mouth every evening.   famotidine  (PEPCID ) 40 MG tablet TAKE 1 TABLET BY MOUTH EVERY DAY   fish oil-omega-3 fatty acids 1000 MG capsule Take 1 g by mouth every evening.   fluticasone  (FLONASE ) 50 MCG/ACT nasal spray Place 2 sprays into both nostrils daily.   gabapentin  (NEURONTIN ) 100 MG capsule TAKE 1 CAPSULE (100 MG TOTAL) BY MOUTH 2 (TWO) TIMES DAILY AS NEEDED (NERVE PAIN).   gabapentin  (NEURONTIN ) 300 MG capsule TAKE 1 CAPSULE BY MOUTH AT BEDTIME   glucosamine-chondroitin 500-400 MG tablet Take 1 tablet by mouth daily.   levothyroxine  (SYNTHROID ) 112 MCG tablet TAKE 1 TABLET BY MOUTH EVERY DAY   lisinopril -hydrochlorothiazide  (ZESTORETIC ) 20-25 MG tablet TAKE 1 TABLET BY MOUTH EVERY DAY   Magnesium  Oxide 400 MG CAPS Take 1 capsule (400 mg total) by mouth at bedtime.  Multiple Vitamin (MULTIVITAMIN ADULT PO) Take 1 capsule by mouth daily.   rosuvastatin  (CRESTOR ) 10 MG tablet TAKE 1 TABLET (10 MG TOTAL) BY MOUTH 2 (TWO) TIMES A WEEK.   No facility-administered encounter medications on file as of 11/25/2023.    Allergies (verified) Gluten meal, Wheat, and Cephalexin   History: Past Medical History:  Diagnosis Date   Allergy    Celiac sprue    History of radiation therapy 05/26/18- 06/29/18   Right  Breast 15 fractions for a total dose of 40.05 Gy. Right Breast boost 5 fractions for a total dose of 10 Gy   Hypertension    Hypothyroidism    Personal history of radiation therapy 2019   PONV (postoperative nausea and vomiting)    Past Surgical History:  Procedure Laterality Date   ABDOMINAL HYSTERECTOMY     in 105s. fibroids. including cervix removal. per patient was told no further pap smears after.    arthroscopic knee surgery     2015 under Dr. Hazeline Lister   BREAST BIOPSY Right 04/01/2018   BREAST LUMPECTOMY Right 2019   BREAST LUMPECTOMY WITH RADIOACTIVE SEED AND SENTINEL LYMPH NODE BIOPSY Right 04/27/2018   Procedure: RIGHT BREAST LUMPECTOMY WITH RADIOACTIVE SEED AND RIGHT AXILLARYSENTINEL LYMPH NODE BIOPSY ERAS PATHWAY;  Surgeon: Enid Harry, MD;  Location: [facility];  Service: General;  Laterality: Right;  PECTORAL BLOCK   CHOLECYSTECTOMY     COCCYX REMOVAL     after cheerleading accident   OOPHORECTOMY     in 86s   TONSILLECTOMY     Family History  Problem Relation Age of Onset   Hypertension Mother    Coronary artery disease Father        82   COPD Father        passed age 64   Pulmonary fibrosis Father        started in 39s   Idiopathic pulmonary fibrosis Father    Breast cancer Maternal Aunt    Pulmonary fibrosis Paternal Aunt    Stomach cancer Cousin        Paternal   Colon cancer Neg Hx    Esophageal cancer Neg Hx    Pancreatic cancer Neg Hx    Liver disease Neg Hx    Social History   Socioeconomic History   Marital status: Married    Spouse name: Not on file   Number of children: 2   Years of education: Not on file   Highest education level: Not on file  Occupational History   Not on file  Tobacco Use   Smoking status: Former    Current packs/day: 0.00    Average packs/day: 1 pack/day for 39.0 years (39.0 ttl pk-yrs)    Types: Cigarettes    Start date: 07/14/1967    Quit date: 07/13/2006    Years since quitting: 17.3    Smokeless tobacco: Never  Vaping Use   Vaping status: Never Used  Substance and Sexual Activity   Alcohol use: Yes    Comment: socially   Drug use: No   Sexual activity: Not on file  Other Topics Concern   Not on file  Social History Narrative   Married (husband patient of Dr. Arlene Ben), 2 children, 1 grandchild   Her mom lives with her. Dad passed 2017- had lived with them.       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      Hobbies: Traveling   Social Drivers of Dispensing optician  Resource Strain: Low Risk  (11/25/2023)   Overall Financial Resource Strain (CARDIA)    Difficulty of Paying Living Expenses: Not hard at all  Food Insecurity: No Food Insecurity (11/25/2023)   Hunger Vital Sign    Worried About Running Out of Food in the Last Year: Never true    Ran Out of Food in the Last Year: Never true  Transportation Needs: No Transportation Needs (11/25/2023)   PRAPARE - Administrator, Civil Service (Medical): No    Lack of Transportation (Non-Medical): No  Physical Activity: Inactive (11/25/2023)   Exercise Vital Sign    Days of Exercise per Week: 0 days    Minutes of Exercise per Session: 0 min  Stress: No Stress Concern Present (11/25/2023)   [employer] of Occupational Health - Occupational Stress Questionnaire    Feeling of Stress : Not at all  Social Connections: Moderately Isolated (11/25/2023)   Social Connection and Isolation Panel [NHANES]    Frequency of Communication with Friends and Family: More than three times a week    Frequency of Social Gatherings with Friends and Family: More than three times a week    Attends Religious Services: Never    Database administrator or Organizations: No    Attends Engineer, structural: Never    Marital Status: Married    Tobacco Counseling Counseling given: Not Answered    Clinical Intake:  Pre-visit preparation completed: Yes  Pain : 0-10 Pain Score: 6  Pain Type:  Chronic pain Pain Location: Back Pain Onset: More than a month ago Pain Frequency: Intermittent     BMI - recorded: 31.79 Diabetes: No  Lab Results  Component Value Date   HGBA1C 5.4 06/08/2023     How often do you need to have someone help you when you read instructions, pamphlets, or other written materials from your doctor or pharmacy?: 1 - Never  Interpreter Needed?: No  Information entered by :: Lamont Pilsner, LPN   Activities of Daily Living     11/25/2023    1:06 PM  In your present state of health, do you have any difficulty performing the following activities:  Hearing? 0  Vision? 0  Difficulty concentrating or making decisions? 0  Walking or climbing stairs? 0  Dressing or bathing? 0  Doing errands, shopping? 0  Preparing Food and eating ? N  Using the Toilet? N  In the past six months, have you accidently leaked urine? N  Do you have problems with loss of bowel control? N  Managing your Medications? N  Managing your Finances? N  Housekeeping or managing your Housekeeping? N    Patient Care Team: Almira Jaeger, MD as PCP - General (Family Medicine) [place], Peter M, MD as PCP - Cardiology (Cardiology) Enid Harry, MD as Consulting Physician (General Surgery) Magrinat, Rozella Cornfield, MD (Inactive) as Consulting Physician (Oncology) Colie Dawes, MD as Attending Physician (Radiation Oncology) Armbruster, Lendon Queen, MD as Consulting Physician (Gastroenterology) Rolando Cliche, [facility] as Pharmacist (Pharmacist)  Indicate any recent Medical Services you may have received from other than [facility] providers in the past year (date may be approximate).     Assessment:    This is a routine wellness examination for Christine Leon.  Hearing/Vision screen Hearing Screening - Comments:: Pt denies any hearing issues  Vision Screening - Comments:: Wears rx glasses - up to date with routine eye exams with Dr Alvina Axon     Goals Addressed  This Visit's  Progress    Patient Stated       Be able to move more and lose weight        Depression Screen     11/25/2023    1:09 PM 06/08/2023   10:25 AM 10/29/2022    3:07 PM 07/11/2021    3:22 PM 05/16/2021    9:30 AM 11/04/2020   10:32 AM 05/06/2020   10:37 AM  PHQ 2/9 Scores  PHQ - 2 Score 0 0 0 0 0 0 0  PHQ- 9 Score  0    0     Fall Risk     11/25/2023    1:11 PM 06/08/2023   10:25 AM 10/29/2022    3:10 PM 07/11/2021    3:24 PM 05/16/2021    9:30 AM  Fall Risk   Falls in the past year? 0 0 0 0 0  Number falls in past yr: 0 0 0 0 0  Injury with Fall? 0 0 0 0 0  Risk for fall due to : No Fall Risks No Fall Risks Impaired vision;Impaired mobility Impaired vision   Follow up Falls prevention discussed Falls evaluation completed Falls prevention discussed Falls prevention discussed Falls evaluation completed    MEDICARE RISK AT HOME:  Medicare Risk at Home Any stairs in or around the home?: Yes If so, are there any without handrails?: No Home free of loose throw rugs in walkways, pet beds, electrical cords, etc?: Yes Adequate lighting in your home to reduce risk of falls?: Yes Life alert?: No Use of a cane, walker or w/c?: No Grab bars in the bathroom?: No Shower chair or bench in shower?: No Elevated toilet seat or a handicapped toilet?: No  TIMED UP AND GO:  Was the test performed?  No  Cognitive Function: 6CIT completed        11/25/2023    1:12 PM 10/29/2022    3:11 PM 07/11/2021    3:24 PM  6CIT Screen  What Year? 0 points 0 points 0 points  What month? 0 points 0 points 0 points  What time? 0 points 0 points 0 points  Count back from 20 0 points 0 points 0 points  Months in reverse 0 points 0 points 0 points  Repeat phrase 0 points 0 points 0 points  Total Score 0 points 0 points 0 points    Immunizations Immunization History  Administered Date(s) Administered   Fluad Quad(high Dose 65+) 05/04/2019   Influenza, High Dose Seasonal PF 05/21/2016, 04/19/2018    PFIZER(Purple Top)SARS-COV-2 Vaccination 08/03/2019, 08/22/2019, 05/03/2020   Pneumococcal Conjugate-13 04/14/2016   Pneumococcal Polysaccharide-23 04/15/2017   Td 11/07/2008   Tdap 11/24/2013   Zoster, Live 06/02/2013    Screening Tests Health Maintenance  Topic Date Due   Zoster Vaccines- Shingrix (1 of 2) 05/20/1969   COVID-19 Vaccine (4 - 2024-25 season) 03/14/2023   DTaP/Tdap/Td (3 - Td or Tdap) 11/25/2023   Hepatitis C Screening  07/10/2098 (Originally 05/20/1969)   INFLUENZA VACCINE  02/11/2024   MAMMOGRAM  06/09/2024   Colonoscopy  12/25/2028   Pneumonia Vaccine 82+ Years old  Completed   DEXA SCAN  Completed   HPV VACCINES  Aged Out   Meningococcal B Vaccine  Aged Out    Health Maintenance  Health Maintenance Due  Topic Date Due   Zoster Vaccines- Shingrix (1 of 2) 05/20/1969   COVID-19 Vaccine (4 - 2024-25 season) 03/14/2023   DTaP/Tdap/Td (3 - Td or Tdap) 11/25/2023  Health Maintenance Items Addressed: See Nurse Notes  Additional Screening:  Vision Screening: Recommended annual ophthalmology exams for early detection of glaucoma and other disorders of the eye.  Dental Screening: Recommended annual dental exams for proper oral hygiene  Community Resource Referral / Chronic Care Management: CRR required this visit?  No   CCM required this visit?  No   Plan:    I have personally reviewed and noted the following in the patient's chart:   Medical and social history Use of alcohol, tobacco or illicit drugs  Current medications and supplements including opioid prescriptions. Patient is not currently taking opioid prescriptions. Functional ability and status Nutritional status Physical activity Advanced directives List of other physicians Hospitalizations, surgeries, and ER visits in previous 12 months Vitals Screenings to include cognitive, depression, and falls Referrals and appointments  In addition, I have reviewed and discussed with patient  certain preventive protocols, quality metrics, and best practice recommendations. A written personalized care plan for preventive services as well as general preventive health recommendations were provided to patient.   Bruno Capri, LPN   4/54/0981   After Visit Summary: (MyChart) Due to this being a telephonic visit, the after visit summary with patients personalized plan was offered to patient via MyChart   Notes: Nothing significant to report at this time.

## 2023-11-25 NOTE — Patient Instructions (Signed)
 Ms. Fripp , Thank you for taking time out of your busy schedule to complete your Annual Wellness Visit with me. I enjoyed our conversation and look forward to speaking with you again next year. I, as well as your care team,  appreciate your ongoing commitment to your health goals. Please review the following plan we discussed and let me know if I can assist you in the future. Your Game plan/ To Do List    Referrals: If you haven't heard from the office you've been referred to, please reach out to them at the phone provided.   Follow up Visits: Next Medicare AWV with our clinical staff: 12/07/24   Have you seen your provider in the last 6 months (3 months if uncontrolled diabetes)? Yes Next Office Visit with your provider: 12/09/23  Clinician Recommendations:  Aim for 30 minutes of exercise or brisk walking, 6-8 glasses of water, and 5 servings of fruits and vegetables each day.       This is a list of the screening recommended for you and due dates:  Health Maintenance  Topic Date Due   Zoster (Shingles) Vaccine (1 of 2) 05/20/1969   COVID-19 Vaccine (4 - 2024-25 season) 03/14/2023   DTaP/Tdap/Td vaccine (3 - Td or Tdap) 11/25/2023   Hepatitis C Screening  07/10/2098*   Flu Shot  02/11/2024   Mammogram  06/09/2024   Colon Cancer Screening  12/25/2028   Pneumonia Vaccine  Completed   DEXA scan (bone density measurement)  Completed   HPV Vaccine  Aged Out   Meningitis B Vaccine  Aged Out  *Topic was postponed. The date shown is not the original due date.    Advanced directives: (Copy Requested) Please bring a copy of your health care power of attorney and living will to the office to be added to your chart at your convenience. You can mail to [facility] [address] [place], [place] [address] or email to [email or web address] Advance Care Planning is important because it:  [x]  Makes sure you receive the medical care that is consistent with your values, goals,  and preferences  [x]  It provides guidance to your family and loved ones and reduces their decisional burden about whether or not they are making the right decisions based on your wishes.  Follow the link provided in your after visit summary or read over the paperwork we have mailed to you to help you started getting your Advance Directives in place. If you need assistance in completing these, please reach out to us  so that we can help you!  See attachments for Preventive Care and Fall Prevention Tips.

## 2023-12-09 ENCOUNTER — Ambulatory Visit (INDEPENDENT_AMBULATORY_CARE_PROVIDER_SITE_OTHER): Payer: Medicare HMO | Admitting: Family Medicine

## 2023-12-09 ENCOUNTER — Encounter: Payer: Self-pay | Admitting: Family Medicine

## 2023-12-09 ENCOUNTER — Ambulatory Visit: Payer: Self-pay | Admitting: Family Medicine

## 2023-12-09 VITALS — BP 136/78 | HR 65 | Temp 97.7°F | Ht 65.5 in | Wt 197.0 lb

## 2023-12-09 DIAGNOSIS — E034 Atrophy of thyroid (acquired): Secondary | ICD-10-CM

## 2023-12-09 DIAGNOSIS — I10 Essential (primary) hypertension: Secondary | ICD-10-CM | POA: Diagnosis not present

## 2023-12-09 DIAGNOSIS — E785 Hyperlipidemia, unspecified: Secondary | ICD-10-CM

## 2023-12-09 DIAGNOSIS — E538 Deficiency of other specified B group vitamins: Secondary | ICD-10-CM | POA: Diagnosis not present

## 2023-12-09 LAB — COMPREHENSIVE METABOLIC PANEL WITH GFR
ALT: 55 U/L — ABNORMAL HIGH (ref 0–35)
AST: 46 U/L — ABNORMAL HIGH (ref 0–37)
Albumin: 4.6 g/dL (ref 3.5–5.2)
Alkaline Phosphatase: 62 U/L (ref 39–117)
BUN: 11 mg/dL (ref 6–23)
CO2: 30 meq/L (ref 19–32)
Calcium: 10 mg/dL (ref 8.4–10.5)
Chloride: 102 meq/L (ref 96–112)
Creatinine, Ser: 0.76 mg/dL (ref 0.40–1.20)
GFR: 77.66 mL/min (ref >60.00)
Glucose, Bld: 91 mg/dL (ref 70–99)
Potassium: 4.1 meq/L (ref 3.5–5.1)
Sodium: 140 meq/L (ref 135–145)
Total Bilirubin: 0.6 mg/dL (ref 0.2–1.2)
Total Protein: 7 g/dL (ref 6.0–8.3)

## 2023-12-09 LAB — VITAMIN B12: Vitamin B-12: 593 pg/mL (ref 211–911)

## 2023-12-09 LAB — TSH: TSH: 2.75 u[IU]/mL (ref 0.35–5.50)

## 2023-12-09 NOTE — Patient Instructions (Addendum)
 Health Maintenance Due  Topic Date Due   MAMMOGRAM  06/10/2023  Please call to schedule mammogram  Please stop by lab before you go If you have mychart- we will send your results within 3 business days of us  receiving them.  If you do not have mychart- we will call you about results within 5 business days of us  receiving them.  *please also note that you will see labs on mychart as soon as they post. I will later go in and write notes on them- will say "notes from Dr. Arlene Ben"   Recommended follow up: Return in about 6 months (around 06/10/2024) for physical or sooner if needed.Schedule b4 you leave.

## 2023-12-09 NOTE — Progress Notes (Signed)
 Phone [phone] In person visit   Subjective:   Christine Leon is a 73 y.o. year old very pleasant female patient who presents for/with See problem oriented charting Chief Complaint  Patient presents with   Medical Management of Chronic Issues   Hypertension   neuropathy in feet    Pt c/o neuropathy in feet getting worse.   Past Medical History-  Patient Active Problem List   Diagnosis Date Noted   Malignant neoplasm of upper-outer quadrant of right breast in female, estrogen receptor positive (HCC) 04/22/2018    Priority: High   History of ischemic colitis     Priority: Medium    Hepatic steatosis 05/06/2021    Priority: Medium    Hyperlipidemia 05/06/2020    Priority: Medium    Aortic atherosclerosis (HCC) 04/15/2017    Priority: Medium    Hot flashes 08/15/2014    Priority: Medium    Obesity 08/15/2014    Priority: Medium    Hypothyroidism 07/19/2014    Priority: Medium    CELIAC SPRUE 04/20/2008    Priority: Medium    Essential hypertension 06/05/2007    Priority: Medium    Syncope 03/30/2022    Priority: Low   Diverticulosis 05/06/2021    Priority: Low   History of adenomatous polyp of colon 06/07/2017    Priority: Low   Retinal tear 08/15/2014    Priority: Low   Left knee pain 07/25/2014    Priority: Low   Allergic rhinitis 07/20/2007    Priority: Low    Medications- reviewed and updated Current Outpatient Medications  Medication Sig Dispense Refill   ALPHA LIPOIC ACID PO Take 1 tablet by mouth daily.     anastrozole  (ARIMIDEX ) 1 MG tablet Take 1 tablet (1 mg total) by mouth daily. 90 tablet 3   Cholecalciferol (VITAMIN D -3 PO) Take 1,500 Units by mouth every evening.     famotidine  (PEPCID ) 40 MG tablet TAKE 1 TABLET BY MOUTH EVERY DAY 90 tablet 3   fish oil-omega-3 fatty acids 1000 MG capsule Take 1 g by mouth every evening.     fluticasone  (FLONASE ) 50 MCG/ACT nasal spray Place 2 sprays into both nostrils daily. 16 g 6   gabapentin   (NEURONTIN ) 100 MG capsule TAKE 1 CAPSULE (100 MG TOTAL) BY MOUTH 2 (TWO) TIMES DAILY AS NEEDED (NERVE PAIN). 180 capsule 3   gabapentin  (NEURONTIN ) 300 MG capsule TAKE 1 CAPSULE BY MOUTH AT BEDTIME 90 capsule 4   glucosamine-chondroitin 500-400 MG tablet Take 1 tablet by mouth daily.     levothyroxine  (SYNTHROID ) 112 MCG tablet TAKE 1 TABLET BY MOUTH EVERY DAY 90 tablet 3   lisinopril -hydrochlorothiazide  (ZESTORETIC ) 20-25 MG tablet TAKE 1 TABLET BY MOUTH EVERY DAY 90 tablet 3   Magnesium  Oxide 400 MG CAPS Take 1 capsule (400 mg total) by mouth at bedtime.  0   Multiple Vitamin (MULTIVITAMIN ADULT PO) Take 1 capsule by mouth daily.     rosuvastatin  (CRESTOR ) 10 MG tablet TAKE 1 TABLET (10 MG TOTAL) BY MOUTH 2 (TWO) TIMES A WEEK. 24 tablet 14   No current facility-administered medications for this visit.     Objective:  BP (!) 140/80   Pulse 65   Temp 97.7 F (36.5 C)   Ht 5' 5.5" (1.664 m)   Wt 197 lb (89.4 kg)   LMP  (LMP Unknown)   SpO2 97%   BMI 32.28 kg/m  Gen: NAD, resting comfortably CV: RRR no murmurs rubs or gallops Lungs: CTAB no crackles, wheeze,  rhonchi Abdomen: soft/nontender/nondistended/normal bowel sounds. No rebound or guarding.  Ext: no edema Skin: warm, dry     Assessment and Plan   #social update- worried about husband and mother has had falls- hard on her. She cares for mom  # Breast cancer-follows closely with Dr. Charolett Copes or Dr. Gudena. Yearly mammogram most recently 06/09/2022 on file though. Remains on arimidex .    # Neuropathy S: Tingling and numbness in toes starting early 2024.  Also felt very irritated and then they want the sheets to touch it at night.  Father had very similar symptoms as well as several other family members -Turmeric, fish oil, D3 helped some -Good feet store insoles were helpful -We later opted to try gabapentin  100 mg twice daily and then 300 mg before bed   and alpha lipoic acid -B12 was low normal and recommended B12-  takes B12 daily  -she feels like worsening but having to be on her feet a lot- sometimes worse than others A/P: neuropathy worsening-  may consider neurology referral later- wants to hold off  #hypertension S: medication: Lisinopril  hydrochlorothiazide  20-25 mg daily Home readings #s: no recent checks  BP Readings from Last 3 Encounters:  12/09/23 136/78  06/08/23 130/82  05/20/23 (!) 155/62  A/P: High acceptable on repeat-continue current medication  #hyperlipidemia with aortic atherosclerosis # Coronary calcium - Coronary calcium  score of 7 in  july 2023 S: Medication:Rosuvastatin  10 mg twice a week -LDL at least under 100-technically LDL goal would be under 70  Lab Results  Component Value Date   CHOL 152 06/08/2023   HDL 52.30 06/08/2023   LDLCALC 71 06/08/2023   LDLDIRECT 86.0 11/18/2022   TRIG 144.0 06/08/2023   CHOLHDL 3 06/08/2023   A/P: very close to ideal goal last visit- continue current medications and she wants to work on healthy eating and regular exercise and weight losss    #hypothyroidism S: compliant On thyroid  medication-levothyroxine  112 mcg  Lab Results  Component Value Date   TSH 0.50 06/08/2023  A/P:hopefully stable- update tsh today. Continue current meds for now    #Fatty liver with mild LFT elevations.  We have discussed working on lifestyle changes- weight up 3 lbs- but stress higher caring for mom- hoping to reverse this trend. She's still working as well -#s improved on last visit- update today   #obesity- wanted to consider Zepbound but cost is barrier. Prior issues with compounded Ozempic. Also offered to refer to healthy weight to wellness- declines for now   Recommended follow up: Return in about 6 months (around 06/10/2024) for physical or sooner if needed.Schedule b4 you leave. Future Appointments  Date Time Provider Department Center  05/22/2024 10:15 AM Cameron Cea, MD [facility] None  12/07/2024  1:00 PM [facility] ANNUAL WELLNESS VISIT  1 [facility]   Lab/Order associations:   ICD-10-CM   1. Essential hypertension  I10 Comprehensive metabolic panel with GFR    2. Hyperlipidemia, unspecified hyperlipidemia type  E78.5 Comprehensive metabolic panel with GFR    3. Hypothyroidism due to acquired atrophy of thyroid   E03.4 TSH    4. B12 deficiency  E53.8 Vitamin B12      No orders of the defined types were placed in this encounter.   Return precautions advised.  Clarisa Crooked, MD

## 2024-01-16 ENCOUNTER — Other Ambulatory Visit: Payer: Self-pay | Admitting: Family Medicine

## 2024-02-29 ENCOUNTER — Telehealth: Payer: Self-pay | Admitting: Family Medicine

## 2024-02-29 ENCOUNTER — Other Ambulatory Visit: Payer: Self-pay | Admitting: Family Medicine

## 2024-02-29 DIAGNOSIS — Z9889 Other specified postprocedural states: Secondary | ICD-10-CM

## 2024-02-29 DIAGNOSIS — R5383 Other fatigue: Secondary | ICD-10-CM

## 2024-02-29 DIAGNOSIS — Z836 Family history of other diseases of the respiratory system: Secondary | ICD-10-CM

## 2024-02-29 NOTE — Telephone Encounter (Signed)
 Order placed for chest xray. Left vm or pt stating the order has been placed ands he can come into the office to get it done.    Copied from CRM [id number]. Topic: Clinical - Request for Lab/Test Order >> Feb 29, 2024 12:30 PM Tinnie BROCKS wrote: Reason for CRM: Pt desires a chest x-ray that was discussed 05/2023. She is wanting a call once the order is placed.

## 2024-03-01 ENCOUNTER — Ambulatory Visit
Admission: RE | Admit: 2024-03-01 | Discharge: 2024-03-01 | Disposition: A | Discharge status: Home / Self Care | Source: Ambulatory Visit | Attending: Family Medicine | Admitting: Family Medicine

## 2024-03-01 DIAGNOSIS — R5383 Other fatigue: Secondary | ICD-10-CM | POA: Diagnosis not present

## 2024-03-01 DIAGNOSIS — Z836 Family history of other diseases of the respiratory system: Secondary | ICD-10-CM

## 2024-03-07 ENCOUNTER — Ambulatory Visit: Payer: Self-pay | Admitting: Family Medicine

## 2024-03-07 ENCOUNTER — Telehealth: Payer: Self-pay | Admitting: Family Medicine

## 2024-03-07 NOTE — Telephone Encounter (Signed)
 Spoke with patient regarding delay of radiology results. IF not read by tomorrow afternoon I will call and have the priority changed.   Pt verbalized understanding and no further questions at this time.   Copied from CRM #[id number]. Topic: Clinical - Lab/Test Results >> Mar 07, 2024  9:33 AM Christine Leon wrote: Reason for CRM:   Patient is looking - X- Ray Results - Patient went to have an X-ray done at the [place] location on 8/20 per  Christine Garnette KIDD, MD request.  PCP/PCP Team Please Advise patient of Results ASAP

## 2024-03-16 ENCOUNTER — Encounter

## 2024-03-29 ENCOUNTER — Ambulatory Visit
Admission: RE | Admit: 2024-03-29 | Discharge: 2024-03-29 | Disposition: A | Discharge status: Home / Self Care | Source: Ambulatory Visit | Attending: Family Medicine | Admitting: Family Medicine

## 2024-03-29 DIAGNOSIS — Z9889 Other specified postprocedural states: Secondary | ICD-10-CM

## 2024-05-11 ENCOUNTER — Other Ambulatory Visit: Payer: Self-pay | Admitting: Hematology and Oncology

## 2024-05-22 ENCOUNTER — Inpatient Hospital Stay: Payer: Medicare HMO | Attending: Hematology and Oncology | Admitting: Hematology and Oncology

## 2024-05-22 VITALS — BP 146/80 | HR 68 | Temp 98.3°F | Resp 16 | Ht 65.5 in | Wt 196.9 lb

## 2024-05-22 DIAGNOSIS — C50411 Malignant neoplasm of upper-outer quadrant of right female breast: Secondary | ICD-10-CM | POA: Diagnosis present

## 2024-05-22 DIAGNOSIS — Z79811 Long term (current) use of aromatase inhibitors: Secondary | ICD-10-CM | POA: Insufficient documentation

## 2024-05-22 DIAGNOSIS — Z170 Estrogen receptor positive status [ER+]: Secondary | ICD-10-CM | POA: Diagnosis not present

## 2024-05-22 DIAGNOSIS — Z780 Asymptomatic menopausal state: Secondary | ICD-10-CM

## 2024-05-22 DIAGNOSIS — R220 Localized swelling, mass and lump, head: Secondary | ICD-10-CM | POA: Diagnosis not present

## 2024-05-22 NOTE — Progress Notes (Signed)
 Patient Care Team: Katrinka Garnette KIDD, MD as PCP - General (Family Medicine) Jordan, Peter M, MD as PCP - Cardiology (Cardiology) Ebbie Cough, MD as Consulting Physician (General Surgery) Izell Domino, MD as Attending Physician (Radiation Oncology) Armbruster, Elspeth SQUIBB, MD as Consulting Physician (Gastroenterology) Pandora Cadet, [facility] as Pharmacist (Pharmacist)  DIAGNOSIS:  Encounter Diagnosis  Name Primary?   Malignant neoplasm of upper-outer quadrant of right breast in female, estrogen receptor positive (HCC) Yes    SUMMARY OF ONCOLOGIC HISTORY: Oncology History  Malignant neoplasm of upper-outer quadrant of right breast in female, estrogen receptor positive (HCC)  07/27/2017 - 08/06/2017 Anti-estrogen oral therapy   Tamoxifen  daily, discontinued due to rash   04/22/2018 Initial Diagnosis   Christine, Leon woman status post right breast upper outer quadrant biopsy for a clinical T1a N0, stage IA invasive ductal carcinoma, grade 1, estrogen and progesterone receptor positive, HER-2 not amplified, with an MIB-1 of 3%.          04/27/2018 Surgery   status post right lumpectomy and sentinel lymph node sampling for a pT1c pN0, stage IA invasive ductal carcinoma, grade 1, with negative margins             (a) 1 sentinel lymph node was removed   04/27/2018 Oncotype testing   Oncotype DX score of 17 predicts a risk of recurrence outside the breast over 9 years of 5% if the patient's only systemic therapy is an antiestrogen for 5 years.  It also predicts no benefit from chemotherapy   05/11/2018 Cancer Staging   Staging form: Breast, AJCC 8th Edition - Clinical: Stage IA (cT1c, cN0, cM0, G1, ER+, PR+, HER2-) - Signed by Izell Domino, MD on 05/11/2018   05/13/2018 Cancer Staging   Staging form: Breast, AJCC 8th Edition - Pathologic: Stage IA (pT1c, pN0, cM0, G1, ER+, PR+, HER2-) - Signed by Izell Domino, MD on 05/13/2018   05/26/2018 - 06/29/2018 Radiation Therapy     1.  Right breast, 2.67 Gy x 15 fractions for a total dose of 40.05 Gy  2. Boost, 2 Gy x 5 fractions for a total dose of 10 Gy   08/31/2018 -  Anti-estrogen oral therapy   Anastrozole  daily     CHIEF COMPLIANT: Surveillance of breast cancer  HISTORY OF PRESENT ILLNESS:   History of Present Illness Christine Leon is a 73 year old female with breast cancer who presents with a painful lump under her tongue.  She has noticed a lump under her tongue since Friday, which is soft, squishy, and feels like a small pocket. It is accompanied by soreness and a sensation of swelling.  She is on anastrozole  for estrogen receptor-positive breast cancer and experiences warmth, likely due to her living situation. No severe hot flashes are present. She is concerned about the potential for other cancers.  She experiences joint pain and stiffness, particularly in her knee, and is scheduled for an injection this week. She has not had a bone density test since 2020. Her mammograms have shown less dense breast tissue, categorized as B.     ALLERGIES:  is allergic to gluten meal, wheat, and cephalexin.  MEDICATIONS:  Current Outpatient Medications  Medication Sig Dispense Refill   ALPHA LIPOIC ACID PO Take 1 tablet by mouth daily.     anastrozole  (ARIMIDEX ) 1 MG tablet TAKE 1 TABLET BY MOUTH EVERY DAY 90 tablet 3   Cholecalciferol (VITAMIN D -3 PO) Take 1,500 Units by mouth every evening.     fish  oil-omega-3 fatty acids 1000 MG capsule Take 1 g by mouth every evening.     fluticasone  (FLONASE ) 50 MCG/ACT nasal spray Place 2 sprays into both nostrils daily. 16 g 6   gabapentin  (NEURONTIN ) 100 MG capsule TAKE 1 CAPSULE (100 MG TOTAL) BY MOUTH 2 (TWO) TIMES DAILY AS NEEDED (NERVE PAIN). 180 capsule 3   gabapentin  (NEURONTIN ) 300 MG capsule TAKE 1 CAPSULE BY MOUTH AT BEDTIME 90 capsule 4   glucosamine-chondroitin 500-400 MG tablet Take 1 tablet by mouth daily.     levothyroxine  (SYNTHROID ) 112 MCG  tablet TAKE 1 TABLET BY MOUTH EVERY DAY 90 tablet 3   lisinopril -hydrochlorothiazide  (ZESTORETIC ) 20-25 MG tablet TAKE 1 TABLET BY MOUTH EVERY DAY 90 tablet 3   Magnesium  Oxide 400 MG CAPS Take 1 capsule (400 mg total) by mouth at bedtime.  0   Multiple Vitamin (MULTIVITAMIN ADULT PO) Take 1 capsule by mouth daily.     rosuvastatin  (CRESTOR ) 10 MG tablet TAKE 1 TABLET (10 MG TOTAL) BY MOUTH 2 (TWO) TIMES A WEEK. 24 tablet 14   No current facility-administered medications for this visit.    PHYSICAL EXAMINATION: ECOG PERFORMANCE STATUS: 1 - Symptomatic but completely ambulatory  Vitals:   05/22/24 0956  BP: (!) 146/80  Pulse: 68  Resp: 16  Temp: 98.3 F (36.8 C)  SpO2: 97%   Filed Weights   05/22/24 0956  Weight: 196 lb 14.4 oz (89.3 kg)    Physical Exam   (exam performed in the presence of a chaperone)  LABORATORY DATA:  I have reviewed the data as listed    Latest Ref Rng & Units 12/09/2023   12:10 PM 06/08/2023   11:56 AM 11/18/2022   12:23 PM  CMP  Glucose 70 - 99 mg/dL 91  85  78   BUN 6 - 23 mg/dL 11  10  12    Creatinine 0.40 - 1.20 mg/dL 9.23  9.25  9.31   Sodium 135 - 145 mEq/L 140  143  139   Potassium 3.5 - 5.1 mEq/L 4.1  4.1  4.7   Chloride 96 - 112 mEq/L 102  103  103   CO2 19 - 32 mEq/L 30  30  24    Calcium  8.4 - 10.5 mg/dL 89.9  89.8  9.7   Total Protein 6.0 - 8.3 g/dL 7.0  7.1  7.1   Total Bilirubin 0.2 - 1.2 mg/dL 0.6  0.8  0.8   Alkaline Phos 39 - 117 U/L 62  65  76   AST 0 - 37 U/L 46  32  41   ALT 0 - 35 U/L 55  42  49     Lab Results  Component Value Date   WBC 9.3 06/08/2023   HGB 14.8 06/08/2023   HCT 44.1 06/08/2023   MCV 90.7 06/08/2023   PLT 253.0 06/08/2023   NEUTROABS 5.2 06/08/2023    ASSESSMENT & PLAN:  Malignant neoplasm of upper-outer quadrant of right breast in female, estrogen receptor positive (HCC) 03/30/2018: Right breast UOQ T1 a N0 stage Ia grade 1 IDC ER/PR positive HER2 negative Ki-67 3% 04/27/2018: Right lumpectomy  T1 cN0 stage Ia grade 1 IDC with negative margins 1 sentinel node negative, Oncotype Dx: 17: 5% 06/28/2018: Completed adjuvant radiation 07/27/2017: Started tamoxifen  discontinued due to rash switched to anastrozole  starting 08/31/2018 to be completed November 2026   Anastrozole  toxicities: Tolerating it well without any problems or concerns.   Breast cancer surveillance: Breast exam 05/22/2024: Benign Mammogram 03/29/2024:  Benign breast density category B Return to clinic in 1 year for follow-up after that she could be seen on an as-needed basis.   Assessment & Plan History of malignant neoplasm of upper-outer quadrant of right breast, status post treatment, on anastrozole  Currently on anastrozole  with joint aches but no severe hot flashes. Mammograms show less dense breast tissue. No increased cancer risk without genetic mutation. Genetic testing available but not necessary without family history. - Continue anastrozole  as prescribed. - Ensure mammograms are up to date. - Discussed genetic testing options if desired. - Plan for bone density scan in February.  Oral mucosal lesion under tongue Reports a soft, mushy lump under the tongue with soreness. Differential includes cyst or stress-related lesion. No smoking or irritants reported. - Attend dental appointment for evaluation of oral lesion. - Consider referral to ENT if necessary.      No orders of the defined types were placed in this encounter.  The patient has a good understanding of the overall plan. she agrees with it. she will call with any problems that may develop before the next visit here.  I personally spent a total of 30 minutes in the care of the patient today including preparing to see the patient, getting/reviewing separately obtained history, performing a medically appropriate exam/evaluation, counseling and educating, placing orders, referring and communicating with other health care professionals, documenting  clinical information in the EHR, independently interpreting results, communicating results, and coordinating care.   Viinay K Avangelina Flight, MD 05/22/24

## 2024-05-22 NOTE — Assessment & Plan Note (Signed)
 03/30/2018: Right breast UOQ T1 a N0 stage Ia grade 1 IDC ER/PR positive HER2 negative Ki-67 3% 04/27/2018: Right lumpectomy T1 cN0 stage Ia grade 1 IDC with negative margins 1 sentinel node negative, Oncotype Dx: 17: 5% 06/28/2018: Completed adjuvant radiation 07/27/2017: Started tamoxifen  discontinued due to rash switched to anastrozole  starting 08/31/2018   Anastrozole  toxicities: Tolerating it well without any problems or concerns.   Breast cancer surveillance: Breast exam 05/22/2024: Benign Mammogram 03/29/2024: Benign breast density category B Return to clinic in 1 year for follow-up.

## 2024-07-10 ENCOUNTER — Encounter: Admitting: Family Medicine

## 2024-07-26 ENCOUNTER — Encounter: Payer: Self-pay | Admitting: Family Medicine

## 2024-07-26 ENCOUNTER — Ambulatory Visit (INDEPENDENT_AMBULATORY_CARE_PROVIDER_SITE_OTHER): Admitting: Family Medicine

## 2024-07-26 VITALS — BP 128/68 | HR 67 | Temp 98.2°F | Ht 65.5 in | Wt 194.6 lb

## 2024-07-26 DIAGNOSIS — Z131 Encounter for screening for diabetes mellitus: Secondary | ICD-10-CM

## 2024-07-26 DIAGNOSIS — E785 Hyperlipidemia, unspecified: Secondary | ICD-10-CM

## 2024-07-26 DIAGNOSIS — E669 Obesity, unspecified: Secondary | ICD-10-CM

## 2024-07-26 DIAGNOSIS — E034 Atrophy of thyroid (acquired): Secondary | ICD-10-CM

## 2024-07-26 DIAGNOSIS — E538 Deficiency of other specified B group vitamins: Secondary | ICD-10-CM

## 2024-07-26 DIAGNOSIS — Z170 Estrogen receptor positive status [ER+]: Secondary | ICD-10-CM

## 2024-07-26 DIAGNOSIS — I10 Essential (primary) hypertension: Secondary | ICD-10-CM | POA: Diagnosis not present

## 2024-07-26 DIAGNOSIS — C50411 Malignant neoplasm of upper-outer quadrant of right female breast: Secondary | ICD-10-CM

## 2024-07-26 DIAGNOSIS — Z0000 Encounter for general adult medical examination without abnormal findings: Secondary | ICD-10-CM | POA: Diagnosis not present

## 2024-07-26 DIAGNOSIS — Z780 Asymptomatic menopausal state: Secondary | ICD-10-CM | POA: Diagnosis not present

## 2024-07-26 LAB — COMPREHENSIVE METABOLIC PANEL WITH GFR
ALT: 54 U/L — ABNORMAL HIGH (ref 3–35)
AST: 38 U/L — ABNORMAL HIGH (ref 5–37)
Albumin: 4.4 g/dL (ref 3.5–5.2)
Alkaline Phosphatase: 58 U/L (ref 39–117)
BUN: 11 mg/dL (ref 6–23)
CO2: 28 meq/L (ref 19–32)
Calcium: 9.8 mg/dL (ref 8.4–10.5)
Chloride: 104 meq/L (ref 96–112)
Creatinine, Ser: 0.78 mg/dL (ref 0.40–1.20)
GFR: 74.95 mL/min
Glucose, Bld: 81 mg/dL (ref 70–99)
Potassium: 3.5 meq/L (ref 3.5–5.1)
Sodium: 141 meq/L (ref 135–145)
Total Bilirubin: 0.7 mg/dL (ref 0.2–1.2)
Total Protein: 7 g/dL (ref 6.0–8.3)

## 2024-07-26 LAB — URINALYSIS, ROUTINE W REFLEX MICROSCOPIC
Bilirubin Urine: NEGATIVE
Hgb urine dipstick: NEGATIVE
Ketones, ur: NEGATIVE
Nitrite: NEGATIVE
RBC / HPF: NONE SEEN
Specific Gravity, Urine: 1.015 (ref 1.000–1.030)
Total Protein, Urine: NEGATIVE
Urine Glucose: NEGATIVE
Urobilinogen, UA: 0.2 (ref 0.0–1.0)
pH: 6 (ref 5.0–8.0)

## 2024-07-26 LAB — CBC WITH DIFFERENTIAL/PLATELET
Basophils Absolute: 0 K/uL (ref 0.0–0.1)
Basophils Relative: 0.6 % (ref 0.0–3.0)
Eosinophils Absolute: 0.1 K/uL (ref 0.0–0.7)
Eosinophils Relative: 1.1 % (ref 0.0–5.0)
HCT: 41.5 % (ref 36.0–46.0)
Hemoglobin: 14.2 g/dL (ref 12.0–15.0)
Lymphocytes Relative: 43.1 % (ref 12.0–46.0)
Lymphs Abs: 2.1 K/uL (ref 0.7–4.0)
MCHC: 34.2 g/dL (ref 30.0–36.0)
MCV: 88.7 fl (ref 78.0–100.0)
Monocytes Absolute: 0.3 K/uL (ref 0.1–1.0)
Monocytes Relative: 6.8 % (ref 3.0–12.0)
Neutro Abs: 2.3 K/uL (ref 1.4–7.7)
Neutrophils Relative %: 48.4 % (ref 43.0–77.0)
Platelets: 248 K/uL (ref 150.0–400.0)
RBC: 4.68 Mil/uL (ref 3.87–5.11)
RDW: 13.5 % (ref 11.5–15.5)
WBC: 4.8 K/uL (ref 4.0–10.5)

## 2024-07-26 LAB — LIPID PANEL
Cholesterol: 146 mg/dL (ref 28–200)
HDL: 54 mg/dL
LDL Cholesterol: 70 mg/dL (ref 10–99)
NonHDL: 91.56
Total CHOL/HDL Ratio: 3
Triglycerides: 109 mg/dL (ref 10.0–149.0)
VLDL: 21.8 mg/dL (ref 0.0–40.0)

## 2024-07-26 LAB — VITAMIN B12: Vitamin B-12: 779 pg/mL (ref 211–911)

## 2024-07-26 LAB — TSH: TSH: 1.6 u[IU]/mL (ref 0.35–5.50)

## 2024-07-26 LAB — HEMOGLOBIN A1C: Hgb A1c MFr Bld: 5.3 % (ref 4.6–6.5)

## 2024-07-26 NOTE — Patient Instructions (Addendum)
 Tetanus, Diphtheria, and Pertussis (Tdap) at pharmacy  Please stop by lab before you go If you have mychart- we will send your results within 3 business days of us  receiving them.  If you do not have mychart- we will call you about results within 5 business days of us  receiving them.  *please also note that you will see labs on mychart as soon as they post. I will later go in and write notes on them- will say notes from Dr. Katrinka   Schedule your bone density test at check out desk.  - located [address] across the street from [place] - in the basement - you DO NEED an appointment for the bone density tests.    Recommended follow up: Return in about 6 months (around 01/23/2025) for followup or sooner if needed.Schedule b4 you leave.

## 2024-07-26 NOTE — Progress Notes (Signed)
 " Phone [phone]   Subjective:  Patient presents today for their annual physical. Chief complaint-noted.   See problem oriented charting- ROS- full  review of systems was completed and negative except for topics noted under acute/chronic concerns  The following were reviewed and entered/updated in epic: Past Medical History:  Diagnosis Date   Allergy    Celiac sprue    History of radiation therapy 05/26/18- 06/29/18   Right Breast 15 fractions for a total dose of 40.05 Gy. Right Breast boost 5 fractions for a total dose of 10 Gy   Hypertension    Hypothyroidism    Personal history of radiation therapy 2019   PONV (postoperative nausea and vomiting)    Patient Active Problem List   Diagnosis Date Noted   Malignant neoplasm of upper-outer quadrant of right breast in female, estrogen receptor positive (HCC) 04/22/2018    Priority: High   History of ischemic colitis     Priority: Medium    Hepatic steatosis 05/06/2021    Priority: Medium    Hyperlipidemia 05/06/2020    Priority: Medium    Aortic atherosclerosis 04/15/2017    Priority: Medium    Hot flashes 08/15/2014    Priority: Medium    Obesity 08/15/2014    Priority: Medium    Hypothyroidism 07/19/2014    Priority: Medium    CELIAC SPRUE 04/20/2008    Priority: Medium    Essential hypertension 06/05/2007    Priority: Medium    Syncope 03/30/2022    Priority: Low   Diverticulosis 05/06/2021    Priority: Low   History of adenomatous polyp of colon 06/07/2017    Priority: Low   Retinal tear 08/15/2014    Priority: Low   Left knee pain 07/25/2014    Priority: Low   Allergic rhinitis 07/20/2007    Priority: Low   Past Surgical History:  Procedure Laterality Date   ABDOMINAL HYSTERECTOMY     in 56s. fibroids. including cervix removal. per patient was told no further pap smears after.    arthroscopic knee surgery     2015 under Dr. Gerome   BREAST BIOPSY Right 04/01/2018   BREAST LUMPECTOMY Right 2019    BREAST LUMPECTOMY WITH RADIOACTIVE SEED AND SENTINEL LYMPH NODE BIOPSY Right 04/27/2018   Procedure: RIGHT BREAST LUMPECTOMY WITH RADIOACTIVE SEED AND RIGHT AXILLARYSENTINEL LYMPH NODE BIOPSY ERAS PATHWAY;  Surgeon: Ebbie Cough, MD;  Location: [facility];  Service: General;  Laterality: Right;  PECTORAL BLOCK   CHOLECYSTECTOMY     COCCYX REMOVAL     after cheerleading accident   OOPHORECTOMY     in 40s   TONSILLECTOMY      Family History  Problem Relation Age of Onset   Hypertension Mother    Coronary artery disease Father        35   COPD Father        passed age 53   Pulmonary fibrosis Father        started in 37s   Idiopathic pulmonary fibrosis Father    Breast cancer Maternal Aunt    Pulmonary fibrosis Paternal Aunt    Stomach cancer Cousin        Paternal   Colon cancer Neg Hx    Esophageal cancer Neg Hx    Pancreatic cancer Neg Hx    Liver disease Neg Hx     Medications- reviewed and updated Current Outpatient Medications  Medication Sig Dispense Refill   ALPHA LIPOIC ACID PO Take 1 tablet by  mouth daily.     anastrozole  (ARIMIDEX ) 1 MG tablet TAKE 1 TABLET BY MOUTH EVERY DAY 90 tablet 3   Cholecalciferol (VITAMIN D -3 PO) Take 1,500 Units by mouth every evening.     fish oil-omega-3 fatty acids 1000 MG capsule Take 1 g by mouth every evening.     fluticasone  (FLONASE ) 50 MCG/ACT nasal spray Place 2 sprays into both nostrils daily. 16 g 6   gabapentin  (NEURONTIN ) 100 MG capsule TAKE 1 CAPSULE (100 MG TOTAL) BY MOUTH 2 (TWO) TIMES DAILY AS NEEDED (NERVE PAIN). 180 capsule 3   gabapentin  (NEURONTIN ) 300 MG capsule TAKE 1 CAPSULE BY MOUTH AT BEDTIME 90 capsule 4   glucosamine-chondroitin 500-400 MG tablet Take 1 tablet by mouth daily.     levothyroxine  (SYNTHROID ) 112 MCG tablet TAKE 1 TABLET BY MOUTH EVERY DAY 90 tablet 3   lisinopril -hydrochlorothiazide  (ZESTORETIC ) 20-25 MG tablet TAKE 1 TABLET BY MOUTH EVERY DAY 90 tablet 3   Magnesium  Oxide 400  MG CAPS Take 1 capsule (400 mg total) by mouth at bedtime.  0   Multiple Vitamin (MULTIVITAMIN ADULT PO) Take 1 capsule by mouth daily.     rosuvastatin  (CRESTOR ) 10 MG tablet TAKE 1 TABLET (10 MG TOTAL) BY MOUTH 2 (TWO) TIMES A WEEK. 24 tablet 14   No current facility-administered medications for this visit.    Allergies-reviewed and updated Allergies[1]  Social History   Social History Narrative   Married (husband patient of Dr. Katrinka), 2 children, 1 grandchild   Her mom lives with her. Dad passed 2017- had lived with them.       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 Oropharynx normal Neck: no thyromegaly CV: RRR no murmurs rubs or gallops Lungs: CTAB no crackles, wheeze, rhonchi Abdomen: soft/nontender/nondistended/normal bowel sounds. No rebound or guarding.  Ext: no edema Skin: warm, dry Neuro: grossly normal, moves all extremities, PERRLA   Assessment and Plan   74 y.o. female presenting for annual physical.  Health Maintenance counseling: 1. Anticipatory guidance: Patient counseled regarding regular dental exams -q6 months, eye exams - sees eye doctor regular with prior retinal tears,  avoiding smoking and second hand smoke , limiting alcohol to 1 beverage per day- rare social with holidays , no illicit drugs .   2. Risk factor reduction:  Advised patient of need for regular exercise and diet rich and fruits and vegetables to reduce risk of heart attack and stroke.  Exercise- knee has been a barrier to her walking. Pool works in summer. [employer] has access if pays- considering.   Diet/weight  management-weight stable in last year- down a few lbs in recent months- she's being intentional but holidays always hard.  Wt Readings from Last 3 Encounters:  07/26/24 194 lb 9.6 oz (88.3 kg)  05/22/24 196 lb 14.4 oz (89.3 kg)  12/09/23 197 lb (89.4 kg)  3. Immunizations/screenings/ancillary studies-declines flu shot, declines Shingrix and Tetanus, Diphtheria, and Pertussis (Tdap)  Immunization History  Administered Date(s) Administered   Fluad Quad(high Dose 65+) 05/04/2019   INFLUENZA,  HIGH DOSE SEASONAL PF 05/21/2016, 04/19/2018   PFIZER(Purple Top)SARS-COV-2 Vaccination 08/03/2019, 08/22/2019, 05/03/2020   Pneumococcal Conjugate-13 04/14/2016   Pneumococcal Polysaccharide-23 04/15/2017   Td 11/07/2008   Tdap 11/24/2013   Zoster, Live 06/02/2013  4. Cervical cancer screening-  hysterectomy and the past age based screening recommendations.  No vaginal discharge or bleeding-we will defer pelvic exam   5. Breast cancer screening-  Breast cancer follow-up-patient with ongoing treatment plan of Arimidex  after prior treatments.  and mammogram 03/29/2024 and has regular follow-up with Dr. Gudena 6. Colon cancer screening -colon polyps with Elspeth SHAUNNA Naval, MD -recall for colonoscopy 7 years- adenoma.  She will be due 12/25/2028   7. Skin cancer screening- Dr. Bard Molt as needed but not in last year. advised regular sunscreen use. Denies worrisome, changing, or new skin lesions 8. Birth control/STD check- monogamous and postmenopausal  9. Osteoporosis screening at 65- DEXA 04/07/19-normal with Dr. Layla in 2020. Excellent bone density  offered repeat- she opts in -Former smoker-quit 2008. Will check UA with labs . 39 pack years . Did not previously qualify for lung cancer screening with breast cancer   Status of chronic or acute concerns   #Area under tongue- went to a doctor with [facility]- thankfully there was no concern. Prior white lesion seen by dentist had resolved.   # Breast  cancer-follows closely with Dr. Magrinat or Dr. Gudena. Yearly mammogram. Remains on arimidex    #tinnitus- not pulsatile in general. Wonders if blood pressure running higher when occurs- can check home readings at those times and let me know  #ongoing knee pain- injection with Dr. Duwayne in September or so but starting to wear off. Was told could repeat every 6 months  #hypertension S: medication: Lisinopril  hydrochlorothiazide  20-25 mg daily - not checking at home BP Readings from Last 3 Encounters:  07/26/24 128/68  05/22/24 (!) 146/80  12/09/23 136/78  A/P: well controlled continue current medications   #hyperlipidemia with aortic atherosclerosis # Coronary calcium - Coronary calcium  score of 7 in  july 2023 S: Medication:Rosuvastatin  10 mg twice a week -LDL at least under 100-technically LDL goal would be under 70 Lab Results  Component Value Date   CHOL 152 06/08/2023   HDL 52.30 06/08/2023   LDLCALC 71 06/08/2023   LDLDIRECT 86.0 11/18/2022   TRIG 144.0 06/08/2023   CHOLHDL 3 06/08/2023   A/P: lipids reasonably well controlled- very close to ideal 70 or lessa to 71- update today    #hypothyroidism S: compliant On thyroid  medication-levothyroxine  112 mcg  Lab Results  Component Value Date   TSH 2.75 12/09/2023   A/P:hopefully stable- update tsh today. Continue current meds for now    #Fatty liver with mild LFT elevations.  We have discussed working on lifestyle changes. Check again today- if any worsening could consider elsastography Lab Results  Component Value Date   ALT 55 (H) 12/09/2023   AST 46 (H) 12/09/2023   ALKPHOS 62 12/09/2023   BILITOT 0.6 12/09/2023    # Neuropathy S: Tingling and numbness in toes starting early 2024.  Also felt very irritated and then they want the sheets to touch it at night.  Father had very similar symptoms as well as several other family members -dance movement psychotherapist were helpful. Buying grounding mat. Sciatica improved as well. Hoping  mat helps even more -Turmeric, fish oil, D3 helped some -[employer] feet store insoles were helpful -We later opted to try gabapentin  100 mg twice daily and then 300 mg before bed in 2025  and at present  A/P: reasonable control lately- continue current medications    # B12 deficiency S: Current treatment/medication (oral vs. IM): b 12   Lab Results  Component Value Date   VITAMINB12 593 12/09/2023  A/P: she may switch to b complex- update levels today but I suspect this would be ok   #Celiac disease- avoids gluten to best of ablity- hard over holidays   Recommended follow up: Return in about 6 months (around 01/23/2025) for followup or sooner if needed.Schedule b4 you leave. Future Appointments  Date Time Provider Department Center  12/07/2024  1:00 PM [facility] [place] VISIT 1 [facility]  05/22/2025 10:30 AM Odean Potts, MD [facility] None   Lab/Order associations: fasting   ICD-10-CM   1. Preventative health care  Z00.00     2. Essential hypertension  I10     3. Hyperlipidemia, unspecified hyperlipidemia type  E78.5     4. Screening for diabetes mellitus  Z13.1     5. Obesity (BMI 30-39.9)  E66.9     6. Hypothyroidism due to acquired atrophy of thyroid   E03.4     7. B12 deficiency  E53.8     8. Malignant neoplasm of upper-outer quadrant of right breast in female, estrogen receptor positive (HCC)  C50.411    Z17.0     9. Postmenopausal  Z78.0       No orders of the defined types were placed in this encounter.   Return precautions advised.  Garnette Lukes, MD      [1]  Allergies Allergen Reactions   Gluten Meal Other (See Comments)    Has CELIAC DISEASE   Wheat Other (See Comments)    Has CELIAC DISEASE   Cephalexin Hives   "

## 2024-07-27 ENCOUNTER — Ambulatory Visit: Payer: Self-pay | Admitting: Family Medicine

## 2024-08-06 ENCOUNTER — Other Ambulatory Visit: Payer: Self-pay | Admitting: Family Medicine

## 2024-08-30 ENCOUNTER — Other Ambulatory Visit (HOSPITAL_BASED_OUTPATIENT_CLINIC_OR_DEPARTMENT_OTHER)

## 2024-12-07 ENCOUNTER — Ambulatory Visit: Payer: Self-pay

## 2025-01-30 ENCOUNTER — Ambulatory Visit: Admitting: Family Medicine

## 2025-05-22 ENCOUNTER — Inpatient Hospital Stay: Admitting: Hematology and Oncology
